# Patient Record
Sex: Male | Born: 1950
Health system: Southern US, Community
[De-identification: ages and names within clinical notes are randomized; demographics above are authoritative.]

## PROBLEM LIST (undated history)

## (undated) DIAGNOSIS — E785 Hyperlipidemia, unspecified: Secondary | ICD-10-CM

## (undated) DIAGNOSIS — F32A Depression, unspecified: Secondary | ICD-10-CM

## (undated) DIAGNOSIS — R011 Cardiac murmur, unspecified: Secondary | ICD-10-CM

## (undated) DIAGNOSIS — F329 Major depressive disorder, single episode, unspecified: Secondary | ICD-10-CM

## (undated) DIAGNOSIS — R0602 Shortness of breath: Secondary | ICD-10-CM

## (undated) DIAGNOSIS — R42 Dizziness and giddiness: Secondary | ICD-10-CM

## (undated) DIAGNOSIS — R51 Headache: Secondary | ICD-10-CM

## (undated) DIAGNOSIS — J449 Chronic obstructive pulmonary disease, unspecified: Secondary | ICD-10-CM

## (undated) DIAGNOSIS — G43701 Chronic migraine without aura, not intractable, with status migrainosus: Secondary | ICD-10-CM

## (undated) DIAGNOSIS — G4730 Sleep apnea, unspecified: Secondary | ICD-10-CM

## (undated) DIAGNOSIS — K219 Gastro-esophageal reflux disease without esophagitis: Secondary | ICD-10-CM

## (undated) DIAGNOSIS — I10 Essential (primary) hypertension: Secondary | ICD-10-CM

## (undated) HISTORY — DX: Chronic migraine without aura, not intractable, with status migrainosus: G43.701

## (undated) HISTORY — PX: OTHER SURGICAL HISTORY: SHX169

---

## 1998-06-23 ENCOUNTER — Ambulatory Visit (HOSPITAL_COMMUNITY): Admission: RE | Admit: 1998-06-23 | Discharge: 1998-06-23 | Payer: Self-pay | Admitting: Family Medicine

## 1999-10-11 ENCOUNTER — Encounter (INDEPENDENT_AMBULATORY_CARE_PROVIDER_SITE_OTHER): Payer: Self-pay

## 1999-10-11 ENCOUNTER — Other Ambulatory Visit: Admission: RE | Admit: 1999-10-11 | Discharge: 1999-10-11 | Payer: Self-pay | Admitting: Gastroenterology

## 1999-12-14 ENCOUNTER — Encounter: Admission: RE | Admit: 1999-12-14 | Discharge: 1999-12-14 | Payer: Self-pay | Admitting: Family Medicine

## 1999-12-14 ENCOUNTER — Encounter: Payer: Self-pay | Admitting: Family Medicine

## 2001-11-08 ENCOUNTER — Encounter: Payer: Self-pay | Admitting: Urology

## 2001-11-13 ENCOUNTER — Observation Stay (HOSPITAL_COMMUNITY): Admission: RE | Admit: 2001-11-13 | Discharge: 2001-11-14 | Payer: Self-pay | Admitting: Urology

## 2002-01-23 ENCOUNTER — Ambulatory Visit (HOSPITAL_COMMUNITY): Admission: RE | Admit: 2002-01-23 | Discharge: 2002-01-23 | Payer: Self-pay | Admitting: Family Medicine

## 2002-01-23 ENCOUNTER — Ambulatory Visit: Admission: RE | Admit: 2002-01-23 | Discharge: 2002-01-23 | Payer: Self-pay | Admitting: Family Medicine

## 2002-01-23 ENCOUNTER — Encounter: Payer: Self-pay | Admitting: Family Medicine

## 2004-07-18 ENCOUNTER — Ambulatory Visit: Payer: Self-pay | Admitting: Family Medicine

## 2004-08-19 ENCOUNTER — Ambulatory Visit (HOSPITAL_COMMUNITY): Admission: RE | Admit: 2004-08-19 | Discharge: 2004-08-19 | Payer: Self-pay | Admitting: Family Medicine

## 2004-08-19 ENCOUNTER — Ambulatory Visit: Payer: Self-pay | Admitting: Sports Medicine

## 2004-08-25 ENCOUNTER — Ambulatory Visit: Payer: Self-pay | Admitting: Family Medicine

## 2004-09-16 ENCOUNTER — Ambulatory Visit (HOSPITAL_COMMUNITY): Admission: RE | Admit: 2004-09-16 | Discharge: 2004-09-16 | Payer: Self-pay | Admitting: Vascular Surgery

## 2004-09-16 ENCOUNTER — Ambulatory Visit: Payer: Self-pay | Admitting: Sports Medicine

## 2004-10-10 ENCOUNTER — Ambulatory Visit: Payer: Self-pay | Admitting: Family Medicine

## 2004-12-12 ENCOUNTER — Ambulatory Visit: Payer: Self-pay | Admitting: Sports Medicine

## 2005-09-28 ENCOUNTER — Ambulatory Visit (HOSPITAL_COMMUNITY): Admission: RE | Admit: 2005-09-28 | Discharge: 2005-09-28 | Payer: Self-pay | Admitting: Family Medicine

## 2005-09-28 ENCOUNTER — Ambulatory Visit: Payer: Self-pay | Admitting: Sports Medicine

## 2006-04-26 ENCOUNTER — Ambulatory Visit: Payer: Self-pay | Admitting: Family Medicine

## 2006-05-03 ENCOUNTER — Ambulatory Visit: Payer: Self-pay | Admitting: Family Medicine

## 2006-05-04 ENCOUNTER — Encounter: Admission: RE | Admit: 2006-05-04 | Discharge: 2006-05-04 | Payer: Self-pay | Admitting: Sports Medicine

## 2006-05-10 ENCOUNTER — Ambulatory Visit: Payer: Self-pay | Admitting: Family Medicine

## 2006-05-23 ENCOUNTER — Ambulatory Visit: Payer: Self-pay | Admitting: Family Medicine

## 2006-05-29 ENCOUNTER — Ambulatory Visit: Payer: Self-pay | Admitting: Family Medicine

## 2006-06-25 ENCOUNTER — Ambulatory Visit (HOSPITAL_COMMUNITY): Admission: RE | Admit: 2006-06-25 | Discharge: 2006-06-25 | Payer: Self-pay | Admitting: *Deleted

## 2006-07-23 ENCOUNTER — Ambulatory Visit: Payer: Self-pay | Admitting: Family Medicine

## 2009-02-11 ENCOUNTER — Encounter: Admission: RE | Admit: 2009-02-11 | Discharge: 2009-02-11 | Payer: Self-pay | Admitting: Neurological Surgery

## 2010-07-20 ENCOUNTER — Encounter: Admission: RE | Admit: 2010-07-20 | Discharge: 2010-07-20 | Payer: Self-pay | Admitting: Neurology

## 2010-09-05 ENCOUNTER — Encounter: Admission: RE | Admit: 2010-09-05 | Discharge: 2010-09-05 | Payer: Self-pay | Admitting: Internal Medicine

## 2010-11-20 ENCOUNTER — Encounter: Payer: Self-pay | Admitting: *Deleted

## 2011-03-17 NOTE — Op Note (Signed)
[facility]  Patient:    Mitchell Baxter, Mitchell Baxter Visit Number: [id number] MRN: [id number]          Service Type: SUR Location: 3W [id number] [id number] Attending Physician:  Lauree Chandler Dictated by:   Maretta Bees. Vonita Moss, M.D. Proc. Date: 11/13/01 Admit Date:  11/13/2001                             Operative Report  PREOPERATIVE DIAGNOSIS:  Impotency.  POSTOPERATIVE DIAGNOSIS:  Impotency.  PROCEDURE:  Insertion of Mentor inflatable prosthesis.  SURGEON:  Maretta Bees. Vonita Moss, M.D.  ASSISTANT:  Veverly Fells. Vernie Ammons, M.D.  ANESTHESIA:  General.  INDICATIONS:  This 60 year old gentleman has a many-year history of impotency going back to the mid 1990s. He has been treated and evaluated at [facility]. He has undergone nocturnal penile tumescence testing and has had penile injection therapy, none of which satisfactorily corrected his erectile dysfunction. For that reason, he was counseled about penile implant and he is admitted for that reason.  DESCRIPTION OF PROCEDURE:  The patient was placed in the supine position and the lower abdomen and external genitalia were given a 10-minute scrub and Foley catheter was inserted per urethra. A penoscrotal incision was made and both corpora dissected out, and anchoring stitches were placed in each corpora proximal to the penoscrotal junction. Corporotomies were performed. Hegar dilators were used to dilate the corpora to Hegar 14 dilators without difficulty. Corporal measurements were 20 cm. At this point, a 70 cc reservoir was placed just behind the right side of the symphysis pubis by perforating the fascia at the level of the external ring and keeping the spermatic cord lateral and distal. The reservoir was then filled without difficulty with 70 cc of water. The Furlow needle passes were used to put each inflatable prosthesis in corresponding corpora. They were then inflated and were in very good position and created a  good straight erection. Scrotal dissection was then utilized to place the pump dependent on the right side of the scrotum. The tubing from the reservoir was then connected to the tubing of the pump with the Titan cylinder valve system. Penile prosthesis then inflated again and had a very good result and then was deflated. The tubings were placed in the back part of the scrotum by suturing soft tissue in front of them. The corpora and the wound were irrigated with antibiotic solution. The scrotal incision was then closed in two layers of running 3-0 chromic catgut. The Coban pressure dressing was placed on the penis and fluffs were placed on the scrotum along with the scrotal support and he was taken to the recovery room in good condition having tolerated the procedure well with no significant blood loss. Dictated by:   Maretta Bees. Vonita Moss, M.D. Attending Physician:  Lauree Chandler DD:  11/13/01 TD:  11/13/01 Job: [id number]

## 2011-11-29 ENCOUNTER — Encounter (HOSPITAL_COMMUNITY)
Admission: RE | Disposition: A | Discharge status: Home / Self Care | Payer: Self-pay | Source: Ambulatory Visit | Attending: Gastroenterology

## 2011-11-29 ENCOUNTER — Ambulatory Visit (HOSPITAL_COMMUNITY)
Admission: RE | Admit: 2011-11-29 | Discharge: 2011-11-29 | Disposition: A | Discharge status: Home / Self Care | Payer: BC Managed Care – PPO | Source: Ambulatory Visit | Attending: Gastroenterology | Admitting: Gastroenterology

## 2011-11-29 ENCOUNTER — Encounter (HOSPITAL_COMMUNITY): Payer: Self-pay | Admitting: Anesthesiology

## 2011-11-29 ENCOUNTER — Ambulatory Visit (HOSPITAL_COMMUNITY): Payer: BC Managed Care – PPO | Admitting: Anesthesiology

## 2011-11-29 ENCOUNTER — Encounter (HOSPITAL_COMMUNITY): Payer: Self-pay | Admitting: *Deleted

## 2011-11-29 DIAGNOSIS — K2980 Duodenitis without bleeding: Secondary | ICD-10-CM | POA: Insufficient documentation

## 2011-11-29 DIAGNOSIS — Z79899 Other long term (current) drug therapy: Secondary | ICD-10-CM | POA: Insufficient documentation

## 2011-11-29 DIAGNOSIS — K219 Gastro-esophageal reflux disease without esophagitis: Secondary | ICD-10-CM | POA: Insufficient documentation

## 2011-11-29 DIAGNOSIS — Z7982 Long term (current) use of aspirin: Secondary | ICD-10-CM | POA: Insufficient documentation

## 2011-11-29 DIAGNOSIS — K2970 Gastritis, unspecified, without bleeding: Secondary | ICD-10-CM | POA: Insufficient documentation

## 2011-11-29 DIAGNOSIS — I10 Essential (primary) hypertension: Secondary | ICD-10-CM | POA: Insufficient documentation

## 2011-11-29 DIAGNOSIS — K449 Diaphragmatic hernia without obstruction or gangrene: Secondary | ICD-10-CM | POA: Insufficient documentation

## 2011-11-29 DIAGNOSIS — K2990 Gastroduodenitis, unspecified, without bleeding: Secondary | ICD-10-CM | POA: Insufficient documentation

## 2011-11-29 HISTORY — PX: BRAVO PH STUDY: SHX5421

## 2011-11-29 HISTORY — DX: Hyperlipidemia, unspecified: E78.5

## 2011-11-29 HISTORY — DX: Headache: R51

## 2011-11-29 HISTORY — DX: Dizziness and giddiness: R42

## 2011-11-29 HISTORY — DX: Major depressive disorder, single episode, unspecified: F32.9

## 2011-11-29 HISTORY — DX: Essential (primary) hypertension: I10

## 2011-11-29 HISTORY — DX: Gastro-esophageal reflux disease without esophagitis: K21.9

## 2011-11-29 HISTORY — DX: Depression, unspecified: F32.A

## 2011-11-29 HISTORY — DX: Chronic obstructive pulmonary disease, unspecified: J44.9

## 2011-11-29 SURGERY — ESOPHAGOGASTRODUODENOSCOPY (EGD) WITH PROPOFOL
Anesthesia: Monitor Anesthesia Care

## 2011-11-29 MED ORDER — KETAMINE HCL 10 MG/ML IJ SOLN
INTRAMUSCULAR | Status: DC | PRN
  Administered 2011-11-29: 10 mg via INTRAVENOUS

## 2011-11-29 MED ORDER — PROPOFOL 10 MG/ML IV EMUL
INTRAVENOUS | Status: DC | PRN
  Administered 2011-11-29: 100 ug/kg/min via INTRAVENOUS

## 2011-11-29 MED ORDER — SODIUM CHLORIDE 0.9 % IV SOLN
INTRAVENOUS | Status: DC | PRN
  Administered 2011-11-29: 10:00:00 via INTRAVENOUS

## 2011-11-29 MED ORDER — BUTAMBEN-TETRACAINE-BENZOCAINE 2-2-14 % EX AERO
INHALATION_SPRAY | CUTANEOUS | Status: DC | PRN
  Administered 2011-11-29: 2 via TOPICAL

## 2011-11-29 MED ORDER — SODIUM CHLORIDE 0.9 % IV SOLN
Freq: Once | INTRAVENOUS | Status: AC
  Administered 2011-11-29: 1000 mL via INTRAVENOUS

## 2011-11-29 MED ORDER — MIDAZOLAM HCL 5 MG/5ML IJ SOLN
INTRAMUSCULAR | Status: DC | PRN
  Administered 2011-11-29: 2 mg via INTRAVENOUS

## 2011-11-29 MED ORDER — FENTANYL CITRATE 0.05 MG/ML IJ SOLN
INTRAMUSCULAR | Status: DC | PRN
  Administered 2011-11-29: 50 ug via INTRAVENOUS

## 2011-11-29 NOTE — Anesthesia Postprocedure Evaluation (Signed)
  Anesthesia Post-op Note  Patient: Mitchell Baxter  Procedure(s) Performed:  ESOPHAGOGASTRODUODENOSCOPY (EGD) WITH PROPOFOL; BRAVO PH STUDY  Patient Location: PACU  Anesthesia Type: MAC  Level of Consciousness: awake and alert   Airway and Oxygen Therapy: Patient Spontanous Breathing  Post-op Pain: mild  Post-op Assessment: Post-op Vital signs reviewed, Patient's Cardiovascular Status Stable, Respiratory Function Stable, Patent Airway and No signs of Nausea or vomiting  Post-op Vital Signs: stable  Complications: No apparent anesthesia complications

## 2011-11-29 NOTE — Op Note (Signed)
[facility] [address] [place], [place]  [address]  ENDOSCOPY PROCEDURE REPORT  PATIENT:  Mitchell Baxter, Mitchell Baxter  MR#:  [id number] BIRTHDATE:  04-14-1951, 60 yrs. old  GENDER:  male  ENDOSCOPIST:  Willis Modena, MD Referred by:  Tomi Bamberger, NP  PROCEDURE DATE:  11/29/2011 PROCEDURE:  Bravo pH probe placement (ON antisecretory therapy) ASA CLASS:  Class III INDICATIONS:  GERD (refractory to multiple medication trials)  MEDICATIONS:   MAC sedation, administered by CRNA, Cetacaine spray x 2  DESCRIPTION OF PROCEDURE:   After the risks benefits and alternatives of the procedure were thoroughly explained, informed consent was obtained.  The  endoscope was introduced through the mouth and advanced to the second portion of the duodenum, without limitations.  The instrument was slowly withdrawn as the mucosa was fully examined.  <<PROCEDUREIMAGES>>  FINDINGS:  Small hiatal hernia; GE junction 43 cm from the incisors.  Otherwise normal esophagus.  Diffuse mild-to-moderate body and antral gastritis, previously seen and biopsied. Mild-to-moderate bulbar duodenitis, previously seen.  Otherwise normal endoscopy.  After completion of endoscopy, endoscope was removed and Bravo pH catheter was placed at 37 cm from the incisors (6cm proximal to GE junction) employing standard protocol.  Second look endoscopy confirmed appropriate positioning of the Bravo device. ENDOSCOPIC IMPRESSION:    1.  Hiatal hernia, gastroduodenitis, all seen on prior endoscopy. 2.  Successful placement of Bravo device, as described above.  RECOMMENDATIONS:      1.  Watch for potential complications of procedure. 2.  Continue Nexium bid for now. 3.  Await pH probe studies.  If persistent GERD is confirmed, consider evalation for Nissen fundoplication.  If persistent GERD is not seen, consider tertiary evaluation (via impedance) for             non-acid reflux. 4.  OV 3-4 weeks.  REPEAT EXAM:   No  ______________________________ Willis Modena  CC:  n. eSIGNEDWillis Modena at 11/29/2011 11:17 AM  Dorothy Puffer, [phone]

## 2011-11-29 NOTE — H&P (Signed)
Patient interval history reviewed.  Patient examined again.  There has been no change from documented H/P dated 11/28/11 (scanned into chart from our office) except as documented above.

## 2011-11-29 NOTE — Transfer of Care (Signed)
Immediate Anesthesia Transfer of Care Note  Patient: Mitchell Baxter  Procedure(s) Performed:  ESOPHAGOGASTRODUODENOSCOPY (EGD) WITH PROPOFOL; BRAVO PH STUDY  Patient Location: PACU and Endoscopy Unit  Anesthesia Type: MAC  Level of Consciousness: awake and alert   Airway & Oxygen Therapy: Patient Spontanous Breathing and Patient connected to nasal cannula oxygen  Post-op Assessment: Report given to PACU RN and Post -op Vital signs reviewed and stable  Post vital signs: Reviewed and stable  Complications: No apparent anesthesia complications

## 2011-11-29 NOTE — Brief Op Note (Signed)
Please see EndoPro note dated 11/29/11.  

## 2011-11-29 NOTE — Anesthesia Preprocedure Evaluation (Addendum)
Anesthesia Evaluation  Patient identified by MRN, date of birth, ID band Patient awake    Reviewed: Allergy & Precautions, H&P , NPO status , Patient's Chart, lab work & pertinent test results  Airway Mallampati: II TM Distance: >3 FB Neck ROM: full    Dental No notable dental hx. (+) Teeth Intact and Dental Advisory Given   Pulmonary COPD COPD inhaler, Current Smoker,  Moderate COPD clear to auscultation  Pulmonary exam normal       Cardiovascular Exercise Tolerance: Good hypertension, On Medications regular Normal    Neuro/Psych  Headaches, vertigo Negative Neurological ROS  Negative Psych ROS   GI/Hepatic negative GI ROS, Neg liver ROS, GERD-  Medicated and Poorly Controlled,  Endo/Other  Negative Endocrine ROS  Renal/GU negative Renal ROS  Genitourinary negative   Musculoskeletal   Abdominal   Peds  Hematology negative hematology ROS (+)   Anesthesia Other Findings   Reproductive/Obstetrics negative OB ROS                          Anesthesia Physical Anesthesia Plan  ASA: III  Anesthesia Plan: MAC   Post-op Pain Management:    Induction:   Airway Management Planned: Simple Face Mask  Additional Equipment:   Intra-op Plan:   Post-operative Plan:   Informed Consent: I have reviewed the patients History and Physical, chart, labs and discussed the procedure including the risks, benefits and alternatives for the proposed anesthesia with the patient or authorized representative who has indicated his/her understanding and acceptance.   Dental Advisory Given  Plan Discussed with: CRNA and Surgeon  Anesthesia Plan Comments:         Anesthesia Quick Evaluation

## 2011-11-30 ENCOUNTER — Encounter (HOSPITAL_COMMUNITY): Payer: Self-pay | Admitting: Gastroenterology

## 2011-12-21 ENCOUNTER — Encounter (INDEPENDENT_AMBULATORY_CARE_PROVIDER_SITE_OTHER): Payer: Self-pay | Admitting: General Surgery

## 2011-12-22 ENCOUNTER — Other Ambulatory Visit (INDEPENDENT_AMBULATORY_CARE_PROVIDER_SITE_OTHER): Payer: Self-pay | Admitting: Surgery

## 2011-12-22 ENCOUNTER — Encounter (INDEPENDENT_AMBULATORY_CARE_PROVIDER_SITE_OTHER): Payer: Self-pay | Admitting: Surgery

## 2011-12-22 ENCOUNTER — Encounter (INDEPENDENT_AMBULATORY_CARE_PROVIDER_SITE_OTHER): Payer: Self-pay | Admitting: General Surgery

## 2011-12-22 ENCOUNTER — Ambulatory Visit
Admission: RE | Admit: 2011-12-22 | Discharge: 2011-12-22 | Disposition: A | Discharge status: Home / Self Care | Payer: BC Managed Care – PPO | Source: Ambulatory Visit | Attending: Surgery | Admitting: Surgery

## 2011-12-22 ENCOUNTER — Ambulatory Visit (INDEPENDENT_AMBULATORY_CARE_PROVIDER_SITE_OTHER): Payer: BC Managed Care – PPO | Admitting: Surgery

## 2011-12-22 VITALS — BP 176/118 | HR 80 | Temp 98.4°F | Resp 20 | Ht 73.0 in | Wt 233.4 lb

## 2011-12-22 DIAGNOSIS — K219 Gastro-esophageal reflux disease without esophagitis: Secondary | ICD-10-CM

## 2011-12-22 DIAGNOSIS — R51 Headache: Secondary | ICD-10-CM

## 2011-12-22 DIAGNOSIS — E7800 Pure hypercholesterolemia, unspecified: Secondary | ICD-10-CM | POA: Insufficient documentation

## 2011-12-22 DIAGNOSIS — F329 Major depressive disorder, single episode, unspecified: Secondary | ICD-10-CM | POA: Insufficient documentation

## 2011-12-22 DIAGNOSIS — I10 Essential (primary) hypertension: Secondary | ICD-10-CM | POA: Insufficient documentation

## 2011-12-22 DIAGNOSIS — F32A Depression, unspecified: Secondary | ICD-10-CM | POA: Insufficient documentation

## 2011-12-22 NOTE — Patient Instructions (Signed)
I think your main problem is motility. Take multiple glasses of MiraLax until you have cleared your colon of stool Obtain the UGI series We will later consider starting you on Reglan

## 2011-12-22 NOTE — Progress Notes (Signed)
Chief Complaint:  GERD symptoms  History of Present Illness:  Mitchell Baxter is an 61 y.o. male who comes with his wife Mitchell Baxter today to discuss his problem with heartburn and hiccoughs.  He had an Endo and biopsy which was negative for H. Pylori. He has taken Prilosec, Zantac, Carafate, and excellent without improvement. He is currently taking Nexium twice a day. He has a history of severe migraines and takes indomethacin for that. On endoscopy day he did have some distal gastritis and perhaps that is related to the indomethacin. He had tried Carafate and that did not help.    Past Medical History  Diagnosis Date  . Hypertension   . Hyperlipidemia   . Headache   . Depression   . GERD (gastroesophageal reflux disease)   . Vertigo   . COPD (chronic obstructive pulmonary disease)     Past Surgical History  Procedure Date  . None   . Bravo ph study 11/29/2011    Procedure: BRAVO PH STUDY;  Surgeon: Freddy Jaksch, MD;  Location: [facility] ENDOSCOPY;  Service: Endoscopy;  Laterality: N/A;    Current Outpatient Prescriptions  Medication Sig Dispense Refill  . aspirin 81 MG tablet Take 160 mg by mouth daily.      Marland Kitchen buPROPion (WELLBUTRIN XL) 150 MG 24 hr tablet Take 150 mg by mouth daily.      Marland Kitchen dexlansoprazole (DEXILANT) 60 MG capsule Take 60 mg by mouth daily.      . diphenhydrAMINE (BENADRYL) 25 MG tablet Take 25 mg by mouth every 6 (six) hours as needed.      Marland Kitchen esomeprazole (NEXIUM) 40 MG capsule Take 40 mg by mouth. 2 times daily      . FLUoxetine (PROZAC) 40 MG capsule Take 40 mg by mouth daily.      . indomethacin (INDOCIN) 50 MG capsule Take 50 mg by mouth 1 day or 1 dose.      . metoprolol succinate (TOPROL-XL) 25 MG 24 hr tablet Take 25 mg by mouth daily.       Review of patient's allergies indicates no known allergies. Family History  Problem Relation Age of Onset  . Anesthesia problems Neg Hx   . Hypotension Neg Hx   . Malignant hyperthermia Neg Hx   . Pseudochol deficiency  Neg Hx   . Cancer Father     colon, pancreatic   Social History:   reports that he has been smoking Cigarettes.  He has been smoking about 1 pack per day. He does not have any smokeless tobacco history on file. He reports that he drinks alcohol. He reports that he does not use illicit drugs.   REVIEW OF SYSTEMS - PERTINENT POSITIVES ONLY: Positive for elevated cholesterol, migraines, constipation of recent onset, abdominal pain.  Physical Exam:   Blood pressure 176/118, pulse 80, temperature 98.4 F (36.9 C), temperature source Temporal, resp. rate 20, height 6\' 1"  (1.854 m), weight 233 lb 6.4 oz (105.87 kg). Body mass index is 30.79 kg/(m^2).  Gen:  WDWN white male NAD  Neurological: Alert and oriented to person, place, and time. Motor and sensory function is grossly intact  Head: Normocephalic and atraumatic.  Eyes: Conjunctivae are normal. Pupils are equal, round, and reactive to light. No scleral icterus.  Neck: Normal range of motion. Neck supple. No tracheal deviation or thyromegaly present.  Abdomen:  nontender  LABORATORY RESULTS: No results found for this or any previous visit (from the past 48 hour(s)).  RADIOLOGY RESULTS: No  results found.  Problem List: Patient Active Problem List  Diagnoses  . Chronic headaches  . HTN (hypertension)  . High cholesterol  . Depression    Assessment & Plan: Delayed gastric emptying and increasing constipation seemed to occurred at the same time as his hiccuping and reflux symptoms. This sounds like a motility issue. I will get an upper GI series to look at his mechanics of swallowing and his EG junction. I will try to get him to clean his colon and we may consider putting him on Reglan in the short term. Plan upper GI series and follow up in the office after that.    Mitchell B. Daphine Deutscher, MD, [facility] [phone] beeper [phone]  12/22/2011 9:28 AM

## 2012-01-01 ENCOUNTER — Encounter (INDEPENDENT_AMBULATORY_CARE_PROVIDER_SITE_OTHER): Payer: Self-pay

## 2012-01-04 ENCOUNTER — Ambulatory Visit (INDEPENDENT_AMBULATORY_CARE_PROVIDER_SITE_OTHER): Payer: BC Managed Care – PPO | Admitting: Surgery

## 2012-01-05 ENCOUNTER — Telehealth (INDEPENDENT_AMBULATORY_CARE_PROVIDER_SITE_OTHER): Payer: Self-pay | Admitting: Surgery

## 2012-01-05 NOTE — Telephone Encounter (Signed)
Is it possible to put him on Reglan as Dr. Kerry Fort had suggested, he's cleaned himself as much as he could and he's not feeling as well as he would like to or would Dr. Kerry Fort rather him have the colonoscopy, please let me know

## 2012-01-09 NOTE — Telephone Encounter (Signed)
I received this from Marnette Burgess what would you like to do?

## 2012-01-12 ENCOUNTER — Other Ambulatory Visit (INDEPENDENT_AMBULATORY_CARE_PROVIDER_SITE_OTHER): Payer: Self-pay | Admitting: Surgery

## 2012-01-12 MED ORDER — METOCLOPRAMIDE HCL 5 MG PO TABS: 5.0000 mg | ORAL_TABLET | Freq: Four times a day (QID) | ORAL | Status: DC

## 2012-03-28 ENCOUNTER — Ambulatory Visit (INDEPENDENT_AMBULATORY_CARE_PROVIDER_SITE_OTHER): Payer: BC Managed Care – PPO | Admitting: Surgery

## 2012-03-28 ENCOUNTER — Other Ambulatory Visit (INDEPENDENT_AMBULATORY_CARE_PROVIDER_SITE_OTHER): Payer: Self-pay | Admitting: Surgery

## 2012-03-28 ENCOUNTER — Encounter (INDEPENDENT_AMBULATORY_CARE_PROVIDER_SITE_OTHER): Payer: Self-pay | Admitting: Surgery

## 2012-03-28 VITALS — BP 137/86 | HR 75 | Temp 98.0°F | Ht 72.0 in | Wt 228.6 lb

## 2012-03-28 DIAGNOSIS — K219 Gastro-esophageal reflux disease without esophagitis: Secondary | ICD-10-CM

## 2012-03-28 NOTE — Progress Notes (Signed)
Chief Complaint:  Choking and GERD; UGI with small hiatus hernia and reflux  History of Present Illness:  Mitchell Baxter is an 61 y.o. male who has been having coughing and choking particularly at night along with reflux. PPIs decrease his acidity but don't help with his coughing.  A recent upper GI series showed a small hiatal hernia with free reflux. I reviewed that study. I discussed laparoscopic Nissen fundoplication with him and his wife [name] and they want to go ahead and proceed with repair. I explained the procedure in some detail and gave him a booklet on this procedure. We'll move towards scheduling for a laparoscopic hiatal hernia repair and Nissen fundoplication.  Past Medical History  Diagnosis Date  . Hypertension   . Hyperlipidemia   . Headache   . Depression   . GERD (gastroesophageal reflux disease)   . Vertigo   . COPD (chronic obstructive pulmonary disease)     Past Surgical History  Procedure Date  . None   . Bravo ph study 11/29/2011    Procedure: BRAVO PH STUDY;  Surgeon: Freddy Jaksch, MD;  Location: [facility];  Service: Endoscopy;  Laterality: N/A;    Current Outpatient Prescriptions  Medication Sig Dispense Refill  . aspirin 81 MG tablet Take 160 mg by mouth daily.      Marland Kitchen buPROPion (WELLBUTRIN XL) 150 MG 24 hr tablet Take 150 mg by mouth daily.      . diphenhydrAMINE (BENADRYL) 25 MG tablet Take 25 mg by mouth every 6 (six) hours as needed.      Marland Kitchen esomeprazole (NEXIUM) 40 MG capsule Take 40 mg by mouth. 2 times daily      . metoprolol succinate (TOPROL-XL) 25 MG 24 hr tablet Take 25 mg by mouth daily.      . metoCLOPramide (REGLAN) 5 MG tablet Take 1 tablet (5 mg total) by mouth 4 (four) times daily.  80 tablet  1  . DISCONTD: FLUoxetine (PROZAC) 40 MG capsule Take 40 mg by mouth daily.      Marland Kitchen DISCONTD: metoCLOPramide (REGLAN) 5 MG tablet        Review of patient's allergies indicates no known allergies. Family History  Problem Relation Age of  Onset  . Anesthesia problems Neg Hx   . Hypotension Neg Hx   . Malignant hyperthermia Neg Hx   . Pseudochol deficiency Neg Hx   . Cancer Father     colon, pancreatic   Social History:   reports that he has been smoking Cigarettes.  He has been smoking about 1 pack per day. He does not have any smokeless tobacco history on file. He reports that he drinks alcohol. He reports that he does not use illicit drugs.   REVIEW OF SYSTEMS - PERTINENT POSITIVES ONLY: Neg for hx of DVT; no prior abdominal surgery  Physical Exam:   Blood pressure 137/86, pulse 75, temperature 98 F (36.7 C), temperature source Temporal, height 6' (1.829 m), weight 228 lb 9.6 oz (103.692 kg), SpO2 94.00%. Body mass index is 31.00 kg/(m^2).  Gen:  WDWN WM NAD  Neurological: Alert and oriented to person, place, and time. Motor and sensory function is grossly intact  Head: Normocephalic and atraumatic.  Eyes: Conjunctivae are normal. Pupils are equal, round, and reactive to light. No scleral icterus.  Neck: Normal range of motion. Neck supple. No tracheal deviation or thyromegaly present.  Cardiovascular:  SR without murmurs or gallops.  No carotid bruits Respiratory: Effort normal.  No respiratory  distress. No chest wall tenderness. Breath sounds normal.  No wheezes, rales or rhonchi.  Abdomen:  nontender GU: Musculoskeletal: Normal range of motion. Extremities are nontender. No cyanosis, edema or clubbing noted Lymphadenopathy: No cervical, preauricular, postauricular or axillary adenopathy is present Skin: Skin is warm and dry. No rash noted. No diaphoresis. No erythema. No pallor. Pscyh: Normal mood and affect. Behavior is normal. Judgment and thought content normal.   LABORATORY RESULTS: No results found for this or any previous visit (from the past 48 hour(s)).  RADIOLOGY RESULTS: No results found.  Problem List: Patient Active Problem List  Diagnoses  . Chronic headaches  . HTN (hypertension)  . High  cholesterol  . Depression    Assessment & Plan: Symptomatic GER and hiatus hernia. Laparoscopic Nissen fundoplication    [name] Daphine Deutscher, MD, [facility] [phone] beeper [phone]  03/28/2012 9:30 AM

## 2012-03-28 NOTE — Patient Instructions (Signed)
Prepare for surgery.

## 2012-04-02 ENCOUNTER — Encounter (HOSPITAL_COMMUNITY): Payer: Self-pay | Admitting: Pharmacy Technician

## 2012-04-09 ENCOUNTER — Encounter (HOSPITAL_COMMUNITY): Payer: Self-pay

## 2012-04-09 ENCOUNTER — Ambulatory Visit (HOSPITAL_COMMUNITY)
Admission: RE | Admit: 2012-04-09 | Discharge: 2012-04-09 | Disposition: A | Discharge status: Home / Self Care | Payer: BC Managed Care – PPO | Source: Ambulatory Visit | Attending: Surgery | Admitting: Surgery

## 2012-04-09 ENCOUNTER — Encounter (HOSPITAL_COMMUNITY)
Admission: RE | Admit: 2012-04-09 | Discharge: 2012-04-09 | Disposition: A | Discharge status: Home / Self Care | Payer: BC Managed Care – PPO | Source: Ambulatory Visit | Attending: Surgery | Admitting: Surgery

## 2012-04-09 DIAGNOSIS — Z01812 Encounter for preprocedural laboratory examination: Secondary | ICD-10-CM | POA: Insufficient documentation

## 2012-04-09 DIAGNOSIS — Z01810 Encounter for preprocedural cardiovascular examination: Secondary | ICD-10-CM | POA: Insufficient documentation

## 2012-04-09 DIAGNOSIS — K449 Diaphragmatic hernia without obstruction or gangrene: Secondary | ICD-10-CM | POA: Insufficient documentation

## 2012-04-09 DIAGNOSIS — K219 Gastro-esophageal reflux disease without esophagitis: Secondary | ICD-10-CM | POA: Insufficient documentation

## 2012-04-09 HISTORY — DX: Sleep apnea, unspecified: G47.30

## 2012-04-09 LAB — SURGICAL PCR SCREEN: Staphylococcus aureus: NEGATIVE

## 2012-04-09 LAB — CBC
HCT: 47.6 % (ref 39.0–52.0)
MCV: 93 fL (ref 78.0–100.0)
RBC: 5.12 MIL/uL (ref 4.22–5.81)
RDW: 13.2 % (ref 11.5–15.5)
WBC: 8.8 10*3/uL (ref 4.0–10.5)

## 2012-04-09 LAB — BASIC METABOLIC PANEL
BUN: 8 mg/dL (ref 6–23)
CO2: 25 mEq/L (ref 19–32)
Chloride: 104 mEq/L (ref 96–112)
Creatinine, Ser: 1.1 mg/dL (ref 0.50–1.35)
GFR calc Af Amer: 81 mL/min — ABNORMAL LOW (ref >90)
Potassium: 4 mEq/L (ref 3.5–5.1)

## 2012-04-09 NOTE — Patient Instructions (Signed)
20 KESSLER SOLLY  04/09/2012   Your procedure is scheduled on:  04-16-2012  Report to [facility] a 0730t  AM.  Call this number if you have problems the morning of surgery: [phone]   Remember: fleets enema night before surgery   Do not eat food or drink liquids:After Midnight.  .  Take these medicines the morning of surgery with A SIP OF WATER: nexium   Do not wear jewelry or make up.  Do not wear lotions, powders, or perfumes.Do not wear deodorant.    Do not bring valuables to the hospital.  Contacts, dentures or bridgework may not be worn into surgery.  Leave suitcase in the car. After surgery it may be brought to your room.  For patients admitted to the hospital, checkout time is 11:00 AM the day of  discharge.     Special Instructions: CHG Shower Use Special Wash: 1/2 bottle night before surgery and 1/2 bottle morning of surgery, use regular soap on face and front and back private area.   Please read over the following fact sheets that you were given: MRSA Information  Cain Sieve [facility] pre op nurse phone number [phone], call if needed

## 2012-04-09 NOTE — Progress Notes (Signed)
04/09/12 1444  OBSTRUCTIVE SLEEP APNEA  Have you ever been diagnosed with sleep apnea through a sleep study? No  Do you snore loudly (loud enough to be heard through closed doors)?  0  Do you often feel tired, fatigued, or sleepy during the daytime? 1  Has anyone observed you stop breathing during your sleep? 0  Do you have, or are you being treated for high blood pressure? 1  BMI more than 35 kg/m2? 0  Age over 62 years old? 1  Neck circumference greater than 40 cm/18 inches? 0  Gender: 1  Obstructive Sleep Apnea Score 4   Score 4 or greater  Updated health history;No PCP

## 2012-04-15 NOTE — H&P (Addendum)
Chief Complaint: Choking and GERD; UGI with small hiatus hernia and reflux  History of Present Illness: Mitchell Baxter is an 61 y.o. male who has been having coughing and choking particularly at night along with reflux. PPIs decrease his acidity but don't help with his coughing.  A recent upper GI series showed a small hiatal hernia with free reflux. I reviewed that study. I discussed laparoscopic Nissen fundoplication with him and his wife Mitchell Baxter and they want to go ahead and proceed with repair. I explained the procedure in some detail and gave him a booklet on this procedure. We'll move towards scheduling for a laparoscopic hiatal hernia repair and Nissen fundoplication.  Past Medical History   Diagnosis  Date   .  Hypertension    .  Hyperlipidemia    .  Headache    .  Depression    .  GERD (gastroesophageal reflux disease)    .  Vertigo    .  COPD (chronic obstructive pulmonary disease)     Past Surgical History   Procedure  Date   .  None    .  Bravo ph study  11/29/2011     Procedure: BRAVO PH STUDY; Surgeon: Mitchell Jaksch, MD; Location: [facility] END[facility]; Service: Endoscopy; Laterality: N/A;    Current Outpatient Prescriptions   Medication  Sig  Dispense  Refill   .  aspirin 81 MG tablet  Take 160 mg by mouth daily.     Marland Kitchen  buPROPion (WELLBUTRIN XL) 150 MG 24 hr tablet  Take 150 mg by mouth daily.     .  diphenhydrAMINE (BENADRYL) 25 MG tablet  Take 25 mg by mouth every 6 (six) hours as needed.     Marland Kitchen  esomeprazole (NEXIUM) 40 MG capsule  Take 40 mg by mouth. 2 times daily     .  metoprolol succinate (TOPROL-XL) 25 MG 24 hr tablet  Take 25 mg by mouth daily.     .  metoCLOPramide (REGLAN) 5 MG tablet  Take 1 tablet (5 mg total) by mouth 4 (four) times daily.  80 tablet  1   .  DISCONTD: FLUoxetine (PROZAC) 40 MG capsule  Take 40 mg by mouth daily.     Marland Kitchen  DISCONTD: metoCLOPramide (REGLAN) 5 MG tablet       Review of patient's allergies indicates no known allergies.  Family History    Problem  Relation  Age of Onset   .  Anesthesia problems  Neg Hx    .  Hypotension  Neg Hx    .  Malignant hyperthermia  Neg Hx    .  Pseudochol deficiency  Neg Hx    .  Cancer  Father       colon, pancreatic    Social History: reports that he has been smoking Cigarettes. He has been smoking about 1 pack per day. He does not have any smokeless tobacco history on file. He reports that he drinks alcohol. He reports that he does not use illicit drugs.  REVIEW OF SYSTEMS - PERTINENT POSITIVES ONLY:  Neg for hx of DVT; no prior abdominal surgery  Physical Exam:  Blood pressure 137/86, pulse 75, temperature 98 F (36.7 C), temperature source Temporal, height 6' (1.829 m), weight 228 lb 9.6 oz (103.692 kg), SpO2 94.00%.  Body mass index is 31.00 kg/(m^2).  Gen: WDWN WM NAD  Neurological: Alert and oriented to person, place, and time. Motor and sensory function is grossly intact  Head: Normocephalic  and atraumatic.  Eyes: Conjunctivae are normal. Pupils are equal, round, and reactive to light. No scleral icterus.  Neck: Normal range of motion. Neck supple. No tracheal deviation or thyromegaly present.  Cardiovascular: SR without murmurs or gallops. No carotid bruits  Respiratory: Effort normal. No respiratory distress. No chest wall tenderness. Breath sounds normal. No wheezes, rales or rhonchi.  Abdomen: nontender  GU:  Musculoskeletal: Normal range of motion. Extremities are nontender. No cyanosis, edema or clubbing noted Lymphadenopathy: No cervical, preauricular, postauricular or axillary adenopathy is present Skin: Skin is warm and dry. No rash noted. No diaphoresis. No erythema. No pallor. Pscyh: Normal mood and affect. Behavior is normal. Judgment and thought content normal.  LABORATORY RESULTS:  No results found for this or any previous visit (from the past 48 hour(s)).  RADIOLOGY RESULTS:  No results found.  Problem List:  Patient Active Problem List   Diagnoses   .  Chronic  headaches   .  HTN (hypertension)   .  High cholesterol   .  Depression    Assessment & Plan:  Symptomatic GER and hiatus hernia.  Laparoscopic Nissen fundoplication  Mitchell B. Daphine Deutscher, MD, [facility]  [phone] beeper  [phone]  There has been no change in the patient's past medical history or physical exam in the past 24 hours to the best of my knowledge. I examined the patient in the holding area and have made any changes to the history and physical exam report that is included above.   Expectations and outcome results have been discussed with the patient to include risks and benefits.  All questions have been answered and we will proceed with previously discussed procedure noted and signed in the consent form in the patient's record.    Mitchell Baxter BMD FACS 9:23 AM  04/16/2012

## 2012-04-16 ENCOUNTER — Observation Stay (HOSPITAL_COMMUNITY)
Admission: RE | Admit: 2012-04-16 | Discharge: 2012-04-18 | DRG: 154 | Disposition: A | Discharge status: Home / Self Care | Payer: BC Managed Care – PPO | Source: Ambulatory Visit | Attending: Surgery | Admitting: Surgery

## 2012-04-16 ENCOUNTER — Encounter (HOSPITAL_COMMUNITY): Payer: Self-pay | Admitting: *Deleted

## 2012-04-16 ENCOUNTER — Encounter (HOSPITAL_COMMUNITY)
Admission: RE | Disposition: A | Discharge status: Home / Self Care | Payer: Self-pay | Source: Ambulatory Visit | Attending: Surgery

## 2012-04-16 ENCOUNTER — Encounter (HOSPITAL_COMMUNITY): Payer: Self-pay | Admitting: Anesthesiology

## 2012-04-16 ENCOUNTER — Ambulatory Visit (HOSPITAL_COMMUNITY): Payer: BC Managed Care – PPO | Admitting: Anesthesiology

## 2012-04-16 DIAGNOSIS — F329 Major depressive disorder, single episode, unspecified: Secondary | ICD-10-CM | POA: Insufficient documentation

## 2012-04-16 DIAGNOSIS — E785 Hyperlipidemia, unspecified: Secondary | ICD-10-CM | POA: Insufficient documentation

## 2012-04-16 DIAGNOSIS — J449 Chronic obstructive pulmonary disease, unspecified: Secondary | ICD-10-CM | POA: Diagnosis not present

## 2012-04-16 DIAGNOSIS — Z7982 Long term (current) use of aspirin: Secondary | ICD-10-CM | POA: Diagnosis not present

## 2012-04-16 DIAGNOSIS — K449 Diaphragmatic hernia without obstruction or gangrene: Secondary | ICD-10-CM | POA: Diagnosis present

## 2012-04-16 DIAGNOSIS — J4489 Other specified chronic obstructive pulmonary disease: Secondary | ICD-10-CM | POA: Insufficient documentation

## 2012-04-16 DIAGNOSIS — F3289 Other specified depressive episodes: Secondary | ICD-10-CM | POA: Insufficient documentation

## 2012-04-16 DIAGNOSIS — I10 Essential (primary) hypertension: Secondary | ICD-10-CM | POA: Diagnosis not present

## 2012-04-16 DIAGNOSIS — R51 Headache: Secondary | ICD-10-CM | POA: Insufficient documentation

## 2012-04-16 DIAGNOSIS — Z79899 Other long term (current) drug therapy: Secondary | ICD-10-CM | POA: Diagnosis not present

## 2012-04-16 DIAGNOSIS — K210 Gastro-esophageal reflux disease with esophagitis: Secondary | ICD-10-CM

## 2012-04-16 DIAGNOSIS — F17200 Nicotine dependence, unspecified, uncomplicated: Secondary | ICD-10-CM | POA: Insufficient documentation

## 2012-04-16 DIAGNOSIS — K219 Gastro-esophageal reflux disease without esophagitis: Secondary | ICD-10-CM | POA: Diagnosis not present

## 2012-04-16 HISTORY — PX: HIATAL HERNIA REPAIR: SHX195

## 2012-04-16 HISTORY — PX: LAPAROSCOPIC NISSEN FUNDOPLICATION: SHX1932

## 2012-04-16 LAB — CBC
MCH: 31.7 pg (ref 26.0–34.0)
MCV: 93.8 fL (ref 78.0–100.0)
Platelets: 235 10*3/uL (ref 150–400)
RDW: 13.3 % (ref 11.5–15.5)

## 2012-04-16 SURGERY — FUNDOPLICATION, NISSEN, LAPAROSCOPIC
Anesthesia: General | Site: Abdomen | Wound class: Clean

## 2012-04-16 MED ORDER — LACTATED RINGERS IV SOLN
INTRAVENOUS | Status: DC
  Administered 2012-04-16: 14:00:00 via INTRAVENOUS
  Administered 2012-04-16: 1000 mL via INTRAVENOUS

## 2012-04-16 MED ORDER — FENTANYL CITRATE 0.05 MG/ML IJ SOLN
INTRAMUSCULAR | Status: DC | PRN
  Administered 2012-04-16 (×2): 100 ug via INTRAVENOUS
  Administered 2012-04-16: 50 ug via INTRAVENOUS

## 2012-04-16 MED ORDER — HYDROMORPHONE HCL PF 1 MG/ML IJ SOLN
INTRAMUSCULAR | Status: AC
  Filled 2012-04-16: qty 1

## 2012-04-16 MED ORDER — ROCURONIUM BROMIDE 100 MG/10ML IV SOLN
INTRAVENOUS | Status: DC | PRN
  Administered 2012-04-16: 20 mg via INTRAVENOUS
  Administered 2012-04-16: 5 mg via INTRAVENOUS
  Administered 2012-04-16: 25 mg via INTRAVENOUS
  Administered 2012-04-16: 20 mg via INTRAVENOUS
  Administered 2012-04-16 (×2): 10 mg via INTRAVENOUS

## 2012-04-16 MED ORDER — PROMETHAZINE HCL 25 MG/ML IJ SOLN
INTRAMUSCULAR | Status: AC
  Filled 2012-04-16: qty 1

## 2012-04-16 MED ORDER — GLYCOPYRROLATE 0.2 MG/ML IJ SOLN
INTRAMUSCULAR | Status: DC | PRN
  Administered 2012-04-16: .8 mg via INTRAVENOUS

## 2012-04-16 MED ORDER — LACTATED RINGERS IR SOLN
Status: DC | PRN
  Administered 2012-04-16: 1000 mL

## 2012-04-16 MED ORDER — UNJURY VANILLA POWDER: 2.0000 [oz_av] | Freq: Four times a day (QID) | ORAL | Status: DC

## 2012-04-16 MED ORDER — ACETAMINOPHEN 10 MG/ML IV SOLN
1000.0000 mg | Freq: Once | INTRAVENOUS | Status: AC
  Administered 2012-04-16: 1000 mg via INTRAVENOUS
  Filled 2012-04-16: qty 100

## 2012-04-16 MED ORDER — SODIUM CHLORIDE 0.9 % IJ SOLN
INTRAMUSCULAR | Status: DC | PRN
  Administered 2012-04-16: 20 mL

## 2012-04-16 MED ORDER — UNJURY CHICKEN SOUP POWDER: 2.0000 [oz_av] | Freq: Four times a day (QID) | ORAL | Status: DC

## 2012-04-16 MED ORDER — MEPERIDINE HCL 50 MG/ML IJ SOLN: 6.2500 mg | INTRAMUSCULAR | Status: DC | PRN

## 2012-04-16 MED ORDER — HEPARIN SODIUM (PORCINE) 5000 UNIT/ML IJ SOLN
INTRAMUSCULAR | Status: AC
  Filled 2012-04-16: qty 1

## 2012-04-16 MED ORDER — HYDROMORPHONE HCL PF 1 MG/ML IJ SOLN
INTRAMUSCULAR | Status: DC | PRN
  Administered 2012-04-16 (×3): 0.5 mg via INTRAVENOUS

## 2012-04-16 MED ORDER — SUCCINYLCHOLINE CHLORIDE 20 MG/ML IJ SOLN
INTRAMUSCULAR | Status: DC | PRN
  Administered 2012-04-16: 100 mg via INTRAVENOUS

## 2012-04-16 MED ORDER — MIDAZOLAM HCL 5 MG/5ML IJ SOLN
INTRAMUSCULAR | Status: DC | PRN
  Administered 2012-04-16: 2 mg via INTRAVENOUS

## 2012-04-16 MED ORDER — ONDANSETRON HCL 4 MG/2ML IJ SOLN: 4.0000 mg | INTRAMUSCULAR | Status: DC | PRN

## 2012-04-16 MED ORDER — PROMETHAZINE HCL 25 MG/ML IJ SOLN
6.2500 mg | INTRAMUSCULAR | Status: DC | PRN
  Administered 2012-04-16: 12.5 mg via INTRAVENOUS

## 2012-04-16 MED ORDER — HEPARIN SODIUM (PORCINE) 5000 UNIT/ML IJ SOLN
5000.0000 [IU] | Freq: Once | INTRAMUSCULAR | Status: AC
  Administered 2012-04-16: 5000 [IU] via SUBCUTANEOUS

## 2012-04-16 MED ORDER — METOPROLOL SUCCINATE ER 25 MG PO TB24
25.0000 mg | ORAL_TABLET | Freq: Every day | ORAL | Status: DC
  Administered 2012-04-16: 25 mg via ORAL
  Filled 2012-04-16: qty 1

## 2012-04-16 MED ORDER — OXYCODONE-ACETAMINOPHEN 5-325 MG/5ML PO SOLN
5.0000 mL | ORAL | Status: DC | PRN
  Administered 2012-04-17 (×5): 5 mL via ORAL
  Filled 2012-04-16 (×5): qty 5

## 2012-04-16 MED ORDER — NEOSTIGMINE METHYLSULFATE 1 MG/ML IJ SOLN
INTRAMUSCULAR | Status: DC | PRN
  Administered 2012-04-16: 5 mg via INTRAVENOUS

## 2012-04-16 MED ORDER — UNJURY CHOCOLATE CLASSIC POWDER
2.0000 [oz_av] | Freq: Four times a day (QID) | ORAL | Status: DC
  Administered 2012-04-17 (×2): 2 [oz_av] via ORAL

## 2012-04-16 MED ORDER — LIDOCAINE HCL (CARDIAC) 20 MG/ML IV SOLN
INTRAVENOUS | Status: DC | PRN
  Administered 2012-04-16: 30 mg via INTRAVENOUS

## 2012-04-16 MED ORDER — BUPIVACAINE LIPOSOME 1.3 % IJ SUSP
20.0000 mL | Freq: Once | INTRAMUSCULAR | Status: AC
  Administered 2012-04-16: 20 mL
  Filled 2012-04-16: qty 20

## 2012-04-16 MED ORDER — ACETAMINOPHEN 10 MG/ML IV SOLN
INTRAVENOUS | Status: AC
  Filled 2012-04-16: qty 100

## 2012-04-16 MED ORDER — METOPROLOL SUCCINATE ER 25 MG PO TB24
25.0000 mg | ORAL_TABLET | Freq: Every evening | ORAL | Status: DC
  Administered 2012-04-16 – 2012-04-17 (×2): 25 mg via ORAL
  Filled 2012-04-16 (×3): qty 1

## 2012-04-16 MED ORDER — HYDROMORPHONE HCL PF 1 MG/ML IJ SOLN: 0.2500 mg | INTRAMUSCULAR | Status: DC | PRN

## 2012-04-16 MED ORDER — PROPOFOL 10 MG/ML IV BOLUS
INTRAVENOUS | Status: DC | PRN
  Administered 2012-04-16: 200 mg via INTRAVENOUS

## 2012-04-16 MED ORDER — EPHEDRINE SULFATE 50 MG/ML IJ SOLN
INTRAMUSCULAR | Status: DC | PRN
  Administered 2012-04-16: 10 mg via INTRAVENOUS

## 2012-04-16 MED ORDER — PHENYLEPHRINE HCL 10 MG/ML IJ SOLN
INTRAMUSCULAR | Status: DC | PRN
  Administered 2012-04-16: 40 ug via INTRAVENOUS

## 2012-04-16 MED ORDER — HEPARIN SODIUM (PORCINE) 5000 UNIT/ML IJ SOLN
5000.0000 [IU] | Freq: Three times a day (TID) | INTRAMUSCULAR | Status: DC
  Administered 2012-04-16 – 2012-04-18 (×5): 5000 [IU] via SUBCUTANEOUS
  Filled 2012-04-16 (×8): qty 1

## 2012-04-16 MED ORDER — LACTATED RINGERS IV SOLN: INTRAVENOUS | Status: DC

## 2012-04-16 MED ORDER — ONDANSETRON HCL 4 MG/2ML IJ SOLN
INTRAMUSCULAR | Status: DC | PRN
  Administered 2012-04-16: 4 mg via INTRAVENOUS

## 2012-04-16 MED ORDER — CEFOXITIN SODIUM 2 G IV SOLR
2.0000 g | INTRAVENOUS | Status: AC
  Administered 2012-04-16: 2 g via INTRAVENOUS
  Filled 2012-04-16: qty 2

## 2012-04-16 MED ORDER — MORPHINE SULFATE 2 MG/ML IJ SOLN
2.0000 mg | INTRAMUSCULAR | Status: DC | PRN
  Administered 2012-04-16 – 2012-04-17 (×2): 2 mg via INTRAVENOUS
  Filled 2012-04-16 (×2): qty 1

## 2012-04-16 MED ORDER — KCL IN DEXTROSE-NACL 20-5-0.45 MEQ/L-%-% IV SOLN
INTRAVENOUS | Status: DC
  Administered 2012-04-16 – 2012-04-18 (×5): via INTRAVENOUS
  Filled 2012-04-16 (×5): qty 1000

## 2012-04-16 MED ORDER — ACETAMINOPHEN 160 MG/5ML PO SOLN: 650.0000 mg | ORAL | Status: DC | PRN

## 2012-04-16 MED ORDER — LACTATED RINGERS IV SOLN
INTRAVENOUS | Status: DC | PRN
  Administered 2012-04-16 (×3): via INTRAVENOUS

## 2012-04-16 MED ORDER — 0.9 % SODIUM CHLORIDE (POUR BTL) OPTIME
TOPICAL | Status: DC | PRN
  Administered 2012-04-16: 1000 mL

## 2012-04-16 SURGICAL SUPPLY — 59 items
ADH SKN CLS APL DERMABOND .7 (GAUZE/BANDAGES/DRESSINGS) ×2
APL SKNCLS STERI-STRIP NONHPOA (GAUZE/BANDAGES/DRESSINGS) ×2
APPLIER CLIP ROT 10 11.4 M/L (STAPLE)
APR CLP MED LRG 11.4X10 (STAPLE)
BENZOIN TINCTURE PRP APPL 2/3 (GAUZE/BANDAGES/DRESSINGS) ×3 IMPLANT
CABLE HIGH FREQUENCY MONO STRZ (ELECTRODE) IMPLANT
CANISTER SUCTION 2500CC (MISCELLANEOUS) IMPLANT
CLAMP ENDO BABCK 10MM (STAPLE) IMPLANT
CLIP APPLIE ROT 10 11.4 M/L (STAPLE) IMPLANT
CLOTH BEACON ORANGE TIMEOUT ST (SAFETY) ×3 IMPLANT
COVER SURGICAL LIGHT HANDLE (MISCELLANEOUS) ×3 IMPLANT
DECANTER SPIKE VIAL GLASS SM (MISCELLANEOUS) ×3 IMPLANT
DERMABOND ADVANCED (GAUZE/BANDAGES/DRESSINGS) ×1
DERMABOND ADVANCED .7 DNX12 (GAUZE/BANDAGES/DRESSINGS) IMPLANT
DEVICE SUT QUICK LOAD TK 5 (STAPLE) ×2 IMPLANT
DEVICE SUT TI-KNOT TK 5X26 (MISCELLANEOUS) ×2 IMPLANT
DEVICE SUTURE ENDOST 10MM (ENDOMECHANICALS) ×3 IMPLANT
DISSECTOR BLUNT TIP ENDO 5MM (MISCELLANEOUS) ×3 IMPLANT
DRAIN PENROSE 18X1/2 LTX STRL (DRAIN) ×3 IMPLANT
DRAPE LAPAROSCOPIC ABDOMINAL (DRAPES) ×3 IMPLANT
ELECT REM PT RETURN 9FT ADLT (ELECTROSURGICAL) ×3
ELECTRODE REM PT RTRN 9FT ADLT (ELECTROSURGICAL) ×2 IMPLANT
FELT TEFLON 4 X1 (Mesh General) ×3 IMPLANT
FILTER SMOKE EVAC LAPAROSHD (FILTER) IMPLANT
GLOVE BIOGEL M 8.0 STRL (GLOVE) ×3 IMPLANT
GLOVE BIOGEL PI IND STRL 7.0 (GLOVE) IMPLANT
GLOVE BIOGEL PI INDICATOR 7.0 (GLOVE)
GOWN STRL NON-REIN LRG LVL3 (GOWN DISPOSABLE) ×3 IMPLANT
GOWN STRL REIN XL XLG (GOWN DISPOSABLE) ×6 IMPLANT
GRASPER ENDO BABCOCK 10 (MISCELLANEOUS) IMPLANT
GRASPER ENDO BABCOCK 10MM (MISCELLANEOUS)
HAND ACTIVATED (MISCELLANEOUS) ×3 IMPLANT
KIT BASIN OR (CUSTOM PROCEDURE TRAY) ×3 IMPLANT
NS IRRIG 1000ML POUR BTL (IV SOLUTION) ×3 IMPLANT
PENCIL BUTTON HOLSTER BLD 10FT (ELECTRODE) IMPLANT
SCISSORS LAP 5X35 DISP (ENDOMECHANICALS) ×3 IMPLANT
SET IRRIG TUBING LAPAROSCOPIC (IRRIGATION / IRRIGATOR) ×3 IMPLANT
SLEEVE ADV FIXATION 5X100MM (TROCAR) IMPLANT
SLEEVE Z-THREAD 5X100MM (TROCAR) IMPLANT
SOLUTION ANTI FOG 6CC (MISCELLANEOUS) ×3 IMPLANT
STAPLER VISISTAT 35W (STAPLE) ×3 IMPLANT
STRIP CLOSURE SKIN 1/2X4 (GAUZE/BANDAGES/DRESSINGS) IMPLANT
SUT SURGIDAC NAB ES-9 0 48 120 (SUTURE) ×12 IMPLANT
SUT VIC AB 4-0 SH 18 (SUTURE) ×3 IMPLANT
SYR 30ML LL (SYRINGE) ×3 IMPLANT
TIP INNERVISION DETACH 40FR (MISCELLANEOUS) IMPLANT
TIP INNERVISION DETACH 50FR (MISCELLANEOUS) IMPLANT
TIP INNERVISION DETACH 56FR (MISCELLANEOUS) IMPLANT
TIPS INNERVISION DETACH 40FR (MISCELLANEOUS)
TRAY FOLEY CATH 14FRSI W/METER (CATHETERS) ×3 IMPLANT
TRAY LAP CHOLE (CUSTOM PROCEDURE TRAY) ×3 IMPLANT
TROCAR ADV FIXATION 11X100MM (TROCAR) IMPLANT
TROCAR ADV FIXATION 5X100MM (TROCAR) IMPLANT
TROCAR XCEL BLUNT TIP 100MML (ENDOMECHANICALS) IMPLANT
TROCAR XCEL NON-BLD 11X100MML (ENDOMECHANICALS) IMPLANT
TROCAR Z-THREAD FIOS 11X100 BL (TROCAR) ×3 IMPLANT
TROCAR Z-THREAD FIOS 5X100MM (TROCAR) ×6 IMPLANT
TROCAR Z-THREAD SLEEVE 11X100 (TROCAR) IMPLANT
TUBING FILTER THERMOFLATOR (ELECTROSURGICAL) ×3 IMPLANT

## 2012-04-16 NOTE — Anesthesia Preprocedure Evaluation (Signed)
Anesthesia Evaluation  Patient identified by MRN, date of birth, ID band Patient awake    Reviewed: Allergy & Precautions, H&P , NPO status , Patient's Chart, lab work & pertinent test results  Airway Mallampati: II TM Distance: >3 FB Neck ROM: full    Dental No notable dental hx. (+) Teeth Intact and Dental Advisory Given   Pulmonary COPD COPD inhaler, Current Smoker,  Moderate COPD breath sounds clear to auscultation  Pulmonary exam normal       Cardiovascular Exercise Tolerance: Good hypertension, On Medications Rhythm:regular Rate:Normal     Neuro/Psych  Headaches, vertigo negative neurological ROS  negative psych ROS   GI/Hepatic negative GI ROS, Neg liver ROS, GERD-  Medicated and Poorly Controlled,  Endo/Other  negative endocrine ROS  Renal/GU negative Renal ROS  negative genitourinary   Musculoskeletal   Abdominal   Peds  Hematology negative hematology ROS (+)   Anesthesia Other Findings   Reproductive/Obstetrics negative OB ROS                           Anesthesia Physical  Anesthesia Plan  ASA: III  Anesthesia Plan: General   Post-op Pain Management:    Induction:   Airway Management Planned: Oral ETT  Additional Equipment:   Intra-op Plan:   Post-operative Plan:   Informed Consent: I have reviewed the patients History and Physical, chart, labs and discussed the procedure including the risks, benefits and alternatives for the proposed anesthesia with the patient or authorized representative who has indicated his/her understanding and acceptance.   Dental Advisory Given  Plan Discussed with: CRNA and Surgeon  Anesthesia Plan Comments:         Anesthesia Quick Evaluation

## 2012-04-16 NOTE — Transfer of Care (Signed)
Immediate Anesthesia Transfer of Care Note  Patient: Mitchell Baxter  Procedure(s) Performed: Procedure(s) (LRB): LAPAROSCOPIC NISSEN FUNDOPLICATION (N/A) LAPAROSCOPIC REPAIR OF HIATAL HERNIA ()  Patient Location: PACU  Anesthesia Type: General  Level of Consciousness: alert  and responds to stimulation  Airway & Oxygen Therapy: Patient Spontanous Breathing and Patient connected to face mask oxygen  Post-op Assessment: Report given to PACU RN, Post -op Vital signs reviewed and stable and Patient moving all extremities  Post vital signs: Reviewed and stable  Complications: No apparent anesthesia complications

## 2012-04-16 NOTE — Op Note (Signed)
Surgeon: Wenda Low, MD, FACS  Asst:  Ovidio Kin M.D. FACS  Anes:  General  Procedure: Laparoscopic Nissen fundoplication with 2 suture repair of the hiatus posteriorly, 3 suture wrap over a #56 lighted bougie  Diagnosis: Gastroesophageal reflux disease  Complications: None seen  EBL:   5 cc  Description of Procedure:  The patient was taken to room 1 and given general anesthesia. After clipping he was prepped with PCMX and draped sterilely. Foley was inserted and a timeout was performed the abdomen was entered to the left upper quadrant using a 0 5 mm Optiview he had a total of 6 trochars were used only one of which was an 11 mm. These were cross the upper abdomen. Nathanson retractor was used to retract the liver and a small dimple was seen initially but he had a plethora of fat in the foregut. I used a harmonic scalpel to dissect the foregut and free up the junction. I got around posteriorly placed a Penrose drain around the esophagogastric junction. A pledgeted suture was used posteriorly and a simple suture was used closure the esophagus to approximate the crura. A 56 lighted bougie was passed to the GE junction the point . With that in place I had completely freed the cardia doing a good posterior dissection coming up along the crura and able to bring around stomach to create the wrap. He would stay back there without tension.  With the bougie in place and then placed 23 sutures anteriorly giving purchases of the esophagus holding in place with Ty knots. Timeout also used posteriorly with the carotid crural approximation. No bleeding was noted. The port sites were all injected with Exparel and closed with 4-0 Vicryl and Dermabond. Patient are the procedure well taken recovery in satisfactory condition.   [name] Daphine Deutscher, MD, [facility], [place] [phone]

## 2012-04-16 NOTE — Anesthesia Postprocedure Evaluation (Signed)
  Anesthesia Post-op Note  Patient: Mitchell Baxter  Procedure(s) Performed: Procedure(s) (LRB): LAPAROSCOPIC NISSEN FUNDOPLICATION (N/A) LAPAROSCOPIC REPAIR OF HIATAL HERNIA ()  Patient Location: PACU  Anesthesia Type: General  Level of Consciousness: awake and alert   Airway and Oxygen Therapy: Patient Spontanous Breathing  Post-op Pain: mild  Post-op Assessment: Post-op Vital signs reviewed, Patient's Cardiovascular Status Stable, Respiratory Function Stable, Patent Airway and No signs of Nausea or vomiting  Post-op Vital Signs: stable  Complications: No apparent anesthesia complications

## 2012-04-16 NOTE — Progress Notes (Signed)
Pt did fleets enema at [facility] with good results

## 2012-04-17 ENCOUNTER — Inpatient Hospital Stay (HOSPITAL_COMMUNITY): Payer: BC Managed Care – PPO

## 2012-04-17 DIAGNOSIS — K449 Diaphragmatic hernia without obstruction or gangrene: Secondary | ICD-10-CM | POA: Diagnosis not present

## 2012-04-17 LAB — DIFFERENTIAL
Basophils Absolute: 0 10*3/uL (ref 0.0–0.1)
Eosinophils Relative: 1 % (ref 0–5)
Lymphocytes Relative: 20 % (ref 12–46)
Monocytes Absolute: 1.1 10*3/uL — ABNORMAL HIGH (ref 0.1–1.0)
Monocytes Relative: 10 % (ref 3–12)

## 2012-04-17 LAB — CBC
HCT: 43.7 % (ref 39.0–52.0)
MCHC: 33.4 g/dL (ref 30.0–36.0)
MCV: 94.6 fL (ref 78.0–100.0)
RDW: 13.4 % (ref 11.5–15.5)
WBC: 11.1 10*3/uL — ABNORMAL HIGH (ref 4.0–10.5)

## 2012-04-17 NOTE — Progress Notes (Signed)
Patient ID: Mitchell Baxter, male   DOB: 1951-05-18, 61 y.o.   MRN: [id number] [facility] Progress Note:   1 Day Post-Op  Subjective: Mental status is clear Objective: Vital signs in last 24 hours: Temp:  [97.9 F (36.6 C)-98.8 F (37.1 C)] 98.8 F (37.1 C) (06/19 2056) Pulse Rate:  [62-74] 71  (06/19 2056) Resp:  [18-20] 20  (06/19 2056) BP: (112-136)/(70-87) 131/87 mmHg (06/19 2056) SpO2:  [90 %-94 %] 93 % (06/19 2056) Weight:  [228 lb 8 oz (103.647 kg)] 228 lb 8 oz (103.647 kg) (06/18 2153)  Intake/Output from previous day: 06/18 0701 - 06/19 0700 In: 6486.7 [I.V.:6486.7] Out: 1035 [Urine:1010; Blood:25] Intake/Output this shift:    Physical Exam: Work of breathing is  Not labored.  Swallow good  Lab Results:  Results for orders placed during the hospital encounter of 04/16/12 (from the past 48 hour(s))  CBC     Status: Abnormal   Collection Time   04/16/12  3:51 PM      Component Value Range Comment   WBC 13.8 (*) 4.0 - 10.5 K/uL    RBC 4.80  4.22 - 5.81 MIL/uL    Hemoglobin 15.2  13.0 - 17.0 g/dL    HCT 11.9  14.7 - 82.9 %    MCV 93.8  78.0 - 100.0 fL    MCH 31.7  26.0 - 34.0 pg    MCHC 33.8  30.0 - 36.0 g/dL    RDW 56.2  13.0 - 86.5 %    Platelets 235  150 - 400 K/uL   CREATININE, SERUM     Status: Abnormal   Collection Time   04/16/12  3:51 PM      Component Value Range Comment   Creatinine, Ser 1.04  0.50 - 1.35 mg/dL    GFR calc non Af Amer 75 (*) >90 mL/min    GFR calc Af Amer 87 (*) >90 mL/min   CBC     Status: Abnormal   Collection Time   04/17/12  5:25 AM      Component Value Range Comment   WBC 11.1 (*) 4.0 - 10.5 K/uL    RBC 4.62  4.22 - 5.81 MIL/uL    Hemoglobin 14.6  13.0 - 17.0 g/dL    HCT 78.4  69.6 - 29.5 %    MCV 94.6  78.0 - 100.0 fL    MCH 31.6  26.0 - 34.0 pg    MCHC 33.4  30.0 - 36.0 g/dL    RDW 28.4  13.2 - 44.0 %    Platelets 219  150 - 400 K/uL   DIFFERENTIAL     Status: Abnormal   Collection Time   04/17/12  5:25 AM     Component Value Range Comment   Neutrophils Relative 69  43 - 77 %    Neutro Abs 7.7  1.7 - 7.7 K/uL    Lymphocytes Relative 20  12 - 46 %    Lymphs Abs 2.2  0.7 - 4.0 K/uL    Monocytes Relative 10  3 - 12 %    Monocytes Absolute 1.1 (*) 0.1 - 1.0 K/uL    Eosinophils Relative 1  0 - 5 %    Eosinophils Absolute 0.1  0.0 - 0.7 K/uL    Basophils Relative 0  0 - 1 %    Basophils Absolute 0.0  0.0 - 0.1 K/uL     Radiology/Results: Dg Ugi W/water Sol Cm  04/17/2012  *RADIOLOGY REPORT*  Clinical Data:  Post Nissen fundoplication.  UPPER GI SERIES WITH KUB  Technique:  Routine upper GI series was performed with water soluble barium.  Fluoroscopy Time: 1.18 minutes  Contrast:  65 ml Omnipaque-300 administered.  Comparison:  12/22/2011  Findings: Initial KUB radiograph demonstrates a nonobstructive bowel gas pattern. Following contrast ingestion, the distal esophagus tapers smoothly through the Nissen wrap with expected postoperative changes of the gastric fundus.  No extravasation of ingested contrast.  No evidence for wrap migration.  There are two round radiopaque densities in proximity to the anastomoses that persisted throughout the examination. .  IMPRESSION: Expected postoperative changes following Nissen fundoplication.  Original Report Authenticated By: Waneta Martins, M.D.    Anti-infectives: Anti-infectives     Start     Dose/Rate Route Frequency Ordered Stop   04/16/12 0754   cefOXitin (MEFOXIN) 2 g in dextrose 5 % 50 mL IVPB        2 g 100 mL/hr over 30 Minutes Intravenous 60 min pre-op 04/16/12 0754 04/16/12 0955          Assessment/Plan: Problem List: Patient Active Problem List  Diagnosis  . Chronic headaches  . HTN (hypertension)  . High cholesterol  . Depression  . GERD (gastroesophageal reflux disease)    Doing well.  Started liquids 1 Day Post-Op    LOS: 1 day   [name] Daphine Deutscher, MD, [facility] [phone]  beeper [phone]  04/17/2012 9:46 PM

## 2012-04-17 NOTE — Progress Notes (Signed)
UR complete 

## 2012-04-17 NOTE — Progress Notes (Signed)
Dr. Daphine Deutscher rounded on pt. Clarification of diet received.

## 2012-04-18 DIAGNOSIS — K449 Diaphragmatic hernia without obstruction or gangrene: Secondary | ICD-10-CM | POA: Diagnosis not present

## 2012-04-18 MED ORDER — OXYCODONE-ACETAMINOPHEN 5-325 MG/5ML PO SOLN: 5.0000 mL | ORAL | Status: DC | PRN

## 2012-04-18 NOTE — Progress Notes (Signed)
D/c instructions given and explained to pt.  Prescription for roxicet given to pt.  Iv site d/c, pressure dressing applied to site.  Pt awaiting ride to get here. Pt alert and stable. Ready for d/c.

## 2012-04-18 NOTE — Discharge Instructions (Signed)
Nissen Fundoplication Care After Please read the instructions outlined below and refer to this sheet for the next few weeks. These discharge instructions provide you with general information on caring for yourself after you leave the hospital. Your doctor may also give you specific instructions. While your treatment has been planned according to the most current medical practices available, unavoidable complications sometimes happen. If you have any problems or questions after discharge, please call your doctor. ACTIVITY  Take frequent rest periods throughout the day.   Take frequent walks throughout the day. This will help to prevent blood clots.   Continue to do your coughing and deep breathing exercises once you get home. This will help to prevent pneumonia.   No strenuous activities such as heavy lifting, pushing or pulling until after your follow-up visit with your doctor. Do not lift anything heavier than 10 pounds.   Talk with your caregiver about when you may return to work and your exercise routine.   You may shower 2 days after surgery. Pat incisions dry. Do not rub incisions with washcloth or towel.   Do not drive while taking prescription pain medication.  NUTRITION  Continue with a liquid diet, or the diet you were directed to take, until your first follow-up visit with your surgeon.   Drink fluids (6-8 glasses a day).   Call your caregiver for persistent nausea (feeling sick to your stomach), vomiting, bloating or difficulty swallowing.  ELIMINATION It is very important not to strain during bowel movements. If constipation should occur, you may:  Take a mild laxative (such as Milk of Magnesia).   Add fruit and bran to your diet.   Drink more fluids.   Call your caregiver if constipation is not relieved.  FEVER If you feel feverish or have shaking chills, take your temperature. If it is 102 F (38.9 C) or above, call your caregiver. The fever may mean there is an  infection. PAIN CONTROL  If a prescription was given for a pain reliever, please follow your caregiver's directions.   Only take over-the-counter or prescription medicines for pain, discomfort, or fever as directed by your caregiver.   If the pain is not relieved by your medicine, becomes worse, or you have difficulty breathing, call your doctor.  INCISION  It is normal for your cuts (incisions) from surgery to have a small amount of drainage for the first 1-2 days. Once the drainage has stopped, leave your incision(s) open to air.   Check your incision(s) and surrounding area daily for any redness, swelling, increased drainage or bleeding. If any of these are present or if the wound edges start to separate, call your doctor.   If you have small adhesive strips in place, they will peel and fall off. (If these strips are covered with a clear bandage, your doctor will tell you when to remove them.)   If you have staples, your caregiver will remove them at the follow-up appointment.  Document Released: 06/08/2004 Document Revised: 10/05/2011 Document Reviewed: 09/12/2007 [facility] Patient Information 2012 [place], [place].

## 2012-04-18 NOTE — Discharge Summary (Signed)
Physician Discharge Summary  Patient ID: Mitchell Baxter MRN: [id number] DOB/AGE: September 03, 1951 61 y.o.  Admit date: 04/16/2012 Discharge date: 04/18/2012  Admission Diagnoses:  GERD  Discharge Diagnoses:  same  Active Problems:  * No active hospital problems. *    Surgery:  Laparoscopic repair of hiatus hernia with Nissen fundoplication over a 56 bougie; 3 suture wrap, 2 suture posterior repair  Discharged Condition: improved  Hospital Course:   Had surgery.  PD 1 had swallow which showed no leak.  Begun on liquids and ready for discharge on PD 2  Consults: none  Significant Diagnostic Studies: UGI    Discharge Exam: Blood pressure 130/80, pulse 66, temperature 98.4 F (36.9 C), temperature source Oral, resp. rate 18, height 6' (1.829 m), weight 228 lb 8 oz (103.647 kg), SpO2 93.00%. Incisions ok.  Pain controlled well.    Disposition: 01-Home or Self Care  Discharge Orders    Future Appointments: Provider: Department: Dept Phone: Center:   05/24/2012 4:30 PM Valarie Merino, MD [facility] [phone] None     Future Orders Please Complete By Expires   Diet - low sodium heart healthy      Increase activity slowly      Discharge instructions      Comments:   Liquid diet for 1 week followed by a pureed diet for 4 weeks   Lifting restrictions      Comments:   Avoid straining when lifting   No dressing needed        Medication List  As of 04/18/2012  8:09 AM   STOP taking these medications         esomeprazole 40 MG capsule      metoCLOPramide 5 MG tablet         TAKE these medications         aspirin 81 MG tablet   Take 160 mg by mouth daily.      buPROPion 150 MG 24 hr tablet   Commonly known as: WELLBUTRIN XL   Take 150 mg by mouth every evening.      diphenhydrAMINE 25 MG tablet   Commonly known as: BENADRYL   Take 25 mg by mouth every 6 (six) hours as needed. allergies      FLUoxetine 40 MG capsule   Commonly known as: PROZAC   Take 40 mg by  mouth every evening.      metoprolol succinate 25 MG 24 hr tablet   Commonly known as: TOPROL-XL   Take 25 mg by mouth every evening.      oxyCODONE-acetaminophen 5-325 MG/5ML solution   Commonly known as: ROXICET   Take 5-10 mLs by mouth every 4 (four) hours as needed.           Follow-up Information    Follow up with Welda Azzarello B, MD in 4 weeks.   Contact information:   [employer] [address], [address] [place] [address] [phone]          Signed: Valarie Merino 04/18/2012, 8:09 AM

## 2012-04-19 ENCOUNTER — Encounter (HOSPITAL_COMMUNITY): Payer: Self-pay | Admitting: Surgery

## 2012-05-10 ENCOUNTER — Telehealth (INDEPENDENT_AMBULATORY_CARE_PROVIDER_SITE_OTHER): Payer: Self-pay | Admitting: General Surgery

## 2012-05-10 MED ORDER — CHLORPROMAZINE HCL 25 MG PO TABS: 25.0000 mg | ORAL_TABLET | Freq: Three times a day (TID) | ORAL | Status: DC

## 2012-05-10 NOTE — Telephone Encounter (Signed)
Spoke with [name], patient's wife, re: his choking sensations and hiccups and also his lack of bowel movements. Per Dr Johna Sheriff I advised to increase his miralax to take this every day to help move his bowels. Can call in chlorpromazine 25 mg po tid as needed for hiccups. I advised for patient to eat small meals more frequently and not three meals. She will let us know if this does not help.

## 2012-05-24 ENCOUNTER — Encounter (INDEPENDENT_AMBULATORY_CARE_PROVIDER_SITE_OTHER): Payer: Self-pay | Admitting: Surgery

## 2012-05-24 ENCOUNTER — Ambulatory Visit (INDEPENDENT_AMBULATORY_CARE_PROVIDER_SITE_OTHER): Payer: BC Managed Care – PPO | Admitting: Surgery

## 2012-05-24 VITALS — BP 128/80 | HR 84 | Temp 98.4°F | Resp 14 | Ht 73.0 in | Wt 220.2 lb

## 2012-05-24 DIAGNOSIS — Z09 Encounter for follow-up examination after completed treatment for conditions other than malignant neoplasm: Secondary | ICD-10-CM

## 2012-05-24 DIAGNOSIS — Z9889 Other specified postprocedural states: Secondary | ICD-10-CM | POA: Insufficient documentation

## 2012-05-24 NOTE — Patient Instructions (Addendum)
Miralax purge this weekend

## 2012-05-24 NOTE — Progress Notes (Signed)
Mitchell Baxter comes in today and he has been having problems with constipation. We discussed that I recommended he take MiraLax to really can't get a good clean out. Also mentioned not wearing such tightfitting blue jeans as I can sometimes have an adverse effect on his abdomen but later in the day.  He's also having some dysphasia and each periods about 5 weeks out and it may be the seeing a little bit too fast. I encouraged him to slow down his eating she was food better and things were treated continue to get better we'll with recurring dysphasia the next 2-3 weeks. I will see him back in 2 months

## 2012-06-28 ENCOUNTER — Ambulatory Visit (INDEPENDENT_AMBULATORY_CARE_PROVIDER_SITE_OTHER): Payer: BC Managed Care – PPO | Admitting: Surgery

## 2012-06-28 ENCOUNTER — Encounter (INDEPENDENT_AMBULATORY_CARE_PROVIDER_SITE_OTHER): Payer: Self-pay | Admitting: Surgery

## 2012-06-28 VITALS — BP 152/88 | HR 76 | Temp 97.1°F | Resp 14 | Ht 73.0 in | Wt 217.0 lb

## 2012-06-28 DIAGNOSIS — R1310 Dysphagia, unspecified: Secondary | ICD-10-CM

## 2012-06-28 NOTE — Patient Instructions (Signed)
Have UGI series Stay on warmer liquids and chew slowly  Thanks for your patience.  If you need further assistance after leaving the office, please call our office and speak with a [facility] nurse.  [phone].  If you want to leave a message for Dr. Daphine Deutscher, please call his office phone at [phone].

## 2012-06-28 NOTE — Progress Notes (Signed)
Mitchell Baxter 61 y.o.  Body mass index is 28.63 kg/(m^2).  Patient Active Problem List  Diagnosis  . Chronic headaches  . HTN (hypertension)  . High cholesterol  . Depression  . GERD (gastroesophageal reflux disease)  . Lap Nissen June 2013    No Known Allergies  Past Surgical History  Procedure Date  . None   . Bravo ph study 11/29/2011    Procedure: BRAVO PH STUDY;  Surgeon: Freddy Jaksch, MD;  Location: [facility];  Service: Endoscopy;  Laterality: N/A;  . Laparoscopic nissen fundoplication 04/16/2012    Procedure: LAPAROSCOPIC NISSEN FUNDOPLICATION;  Surgeon: Valarie Merino, MD;  Location: [facility] ORS;  Service: General;  Laterality: N/A;  . Hiatal hernia repair 04/16/2012    Procedure: LAPAROSCOPIC REPAIR OF HIATAL HERNIA;  Surgeon: Valarie Merino, MD;  Location: [facility] ORS;  Service: Denman George, NP 1. Dysphagia     Having dysphagia.  Is having some pain swallowing liquids. Continuing To have dysphasia and coughing at night possibly related pooling. To me this suggests a motility issues. I think we will need to get an upper GI series.  Assess motility as well as tightness of wrap.    [name], MD, [facility] [phone] beeper [phone]  06/28/2012 10:15 AM

## 2012-07-03 ENCOUNTER — Ambulatory Visit
Admission: RE | Admit: 2012-07-03 | Discharge: 2012-07-03 | Disposition: A | Discharge status: Home / Self Care | Payer: BC Managed Care – PPO | Source: Ambulatory Visit | Attending: Surgery | Admitting: Surgery

## 2012-07-03 DIAGNOSIS — R1310 Dysphagia, unspecified: Secondary | ICD-10-CM

## 2012-07-17 ENCOUNTER — Encounter (INDEPENDENT_AMBULATORY_CARE_PROVIDER_SITE_OTHER): Payer: BC Managed Care – PPO | Admitting: Surgery

## 2012-08-01 ENCOUNTER — Encounter (INDEPENDENT_AMBULATORY_CARE_PROVIDER_SITE_OTHER): Payer: BC Managed Care – PPO | Admitting: Surgery

## 2012-08-08 ENCOUNTER — Other Ambulatory Visit (INDEPENDENT_AMBULATORY_CARE_PROVIDER_SITE_OTHER): Payer: Self-pay | Admitting: Surgery

## 2012-08-08 DIAGNOSIS — R1310 Dysphagia, unspecified: Secondary | ICD-10-CM

## 2012-08-08 MED ORDER — METOCLOPRAMIDE HCL 5 MG PO TABS: 5.0000 mg | ORAL_TABLET | Freq: Four times a day (QID) | ORAL | Status: DC

## 2012-09-19 ENCOUNTER — Other Ambulatory Visit (INDEPENDENT_AMBULATORY_CARE_PROVIDER_SITE_OTHER): Payer: Self-pay | Admitting: Surgery

## 2012-09-19 DIAGNOSIS — R1310 Dysphagia, unspecified: Secondary | ICD-10-CM

## 2012-09-19 MED ORDER — METOCLOPRAMIDE HCL 5 MG PO TABS: 5.0000 mg | ORAL_TABLET | Freq: Four times a day (QID) | ORAL | Status: DC

## 2012-10-03 ENCOUNTER — Encounter (HOSPITAL_COMMUNITY): Payer: Self-pay | Admitting: *Deleted

## 2012-10-03 ENCOUNTER — Ambulatory Visit (HOSPITAL_COMMUNITY)
Admission: RE | Admit: 2012-10-03 | Discharge: 2012-10-03 | Disposition: A | Discharge status: Home / Self Care | Payer: BC Managed Care – PPO | Source: Ambulatory Visit | Attending: Surgery | Admitting: Surgery

## 2012-10-03 ENCOUNTER — Encounter (HOSPITAL_COMMUNITY)
Admission: RE | Disposition: A | Discharge status: Home / Self Care | Payer: Self-pay | Source: Ambulatory Visit | Attending: Surgery

## 2012-10-03 DIAGNOSIS — Z860100 Personal history of colon polyps, unspecified: Secondary | ICD-10-CM | POA: Insufficient documentation

## 2012-10-03 DIAGNOSIS — K644 Residual hemorrhoidal skin tags: Secondary | ICD-10-CM | POA: Insufficient documentation

## 2012-10-03 DIAGNOSIS — K219 Gastro-esophageal reflux disease without esophagitis: Secondary | ICD-10-CM

## 2012-10-03 DIAGNOSIS — F17200 Nicotine dependence, unspecified, uncomplicated: Secondary | ICD-10-CM | POA: Insufficient documentation

## 2012-10-03 DIAGNOSIS — I10 Essential (primary) hypertension: Secondary | ICD-10-CM | POA: Insufficient documentation

## 2012-10-03 DIAGNOSIS — R1310 Dysphagia, unspecified: Secondary | ICD-10-CM | POA: Insufficient documentation

## 2012-10-03 DIAGNOSIS — R141 Gas pain: Secondary | ICD-10-CM | POA: Insufficient documentation

## 2012-10-03 DIAGNOSIS — R142 Eructation: Secondary | ICD-10-CM | POA: Insufficient documentation

## 2012-10-03 HISTORY — DX: Shortness of breath: R06.02

## 2012-10-03 HISTORY — PX: COLONOSCOPY: SHX5424

## 2012-10-03 SURGERY — COLONOSCOPY
Anesthesia: Moderate Sedation

## 2012-10-03 MED ORDER — FENTANYL CITRATE 0.05 MG/ML IJ SOLN
INTRAMUSCULAR | Status: DC | PRN
  Administered 2012-10-03 (×3): 25 ug via INTRAVENOUS

## 2012-10-03 MED ORDER — MIDAZOLAM HCL 5 MG/5ML IJ SOLN
INTRAMUSCULAR | Status: DC | PRN
  Administered 2012-10-03 (×3): 2 mg via INTRAVENOUS

## 2012-10-03 MED ORDER — FENTANYL CITRATE 0.05 MG/ML IJ SOLN
INTRAMUSCULAR | Status: AC
  Filled 2012-10-03: qty 4

## 2012-10-03 MED ORDER — MIDAZOLAM HCL 10 MG/2ML IJ SOLN
INTRAMUSCULAR | Status: AC
  Filled 2012-10-03: qty 4

## 2012-10-03 NOTE — Op Note (Signed)
10/03/2012  10:38 AM  PATIENT:  Mitchell Baxter, 61 y.o., male, MRN: [id number]  PREOP DIAGNOSIS:  History of bloating, history of colonic polyps, last colonoscopy > 10 years  POSTOP DIAGNOSIS:   Normal colon, external hemorrhoids  PROCEDURE:   Procedure(s): COLONOSCOPY  SURGEON:   Ovidio Kin, M.D.   ANESTHESIA:   IV sedation  Moderate Sedation  75 mcgm of Fentanyl, 6 mg of Versed  SPECIMEN:   None  INDICATIONS FOR PROCEDURE:  Mitchell Baxter is a 61 y.o. (DOB: February 21, 1951) white male whose primary care physician is [facility], NP and comes for colonoscopy.   The indications and risks of colonoscopy were explained to the patient.  The risks include, but are not limited to, perforation of the bowel and bleeding.  OPERATIVE NOTE:  The patient was taken to room # 3 in the [facility] endoscopy suite.  The patient was monitored with pulse oximetry, blood pressure cuff, and cardiac monitor.  The had 3 liters of nasal O2 during the procedure.  A time out was held and the checklist reviewed.   The patient was sedated with 75 mcgm of Fentanyl and 6 mgm of Versed.   A digital rectal exam was done at the beginning of the procedure..  The anus and rectum were unremarkable.  He does have some external hemorrhoids.   The flexible Pentax colonoscope was passed up the rectum without difficulty.  The scope was advanced to the cecum and the ileocecal valve was identified.  The colonic prep was good.   The right colon, transverse colon, left colon, and sigmoid colon were unremarkable.  Photos were taken.   The scope was withdrawn into the rectum and retroflexed.  The rectum was unremarkable.  Photos were taken during the procedure and placed in the chart.   The patient was taken to the recovery area of the [facility] endoscopy in good condition.  His wife was with the patient and I explained the findings of the procedure.   The patient's next colonoscopy should be in 10 years, unless he has new symptoms.  Ovidio Kin, MD, [facility] Pager: [phone] Office phone:  [phone]

## 2012-10-03 NOTE — H&P (Signed)
  Progress Notes     Mitchell Baxter 61 y.o. Body mass index is 28.63 kg/(m^2).   Abbie Sons, NP as his primary medical provider.     Patient Active Problem List     Diagnosis     .  Chronic headaches - has migraine HA       He says that no one can do anything for them.    Marland Kitchen  HTN (hypertension)     .  High cholesterol     .  Depression     .  GERD (gastroesophageal reflux disease)     .  Lap Nissen June 2013      No Known Allergies     Past Surgical History     Procedure  Date     .  None      .  Bravo ph study  11/29/2011       Procedure: BRAVO PH STUDY; Surgeon: Freddy Jaksch, MD; Location: [facility]; Service: Endoscopy; Laterality: N/A;     .  Laparoscopic nissen fundoplication  04/16/2012       Procedure: LAPAROSCOPIC NISSEN FUNDOPLICATION; Surgeon: Valarie Merino, MD; Location: [facility]; Service: General; Laterality: N/A;     .  Hiatal hernia repair  04/16/2012       Procedure: LAPAROSCOPIC REPAIR OF HIATAL HERNIA; Surgeon: Valarie Merino, MD; Location: [facility]; Service: Denman George, NP      1.  Dysphagia     Having dysphagia. Is having some pain swallowing liquids. Continuing To have dysphasia and coughing at night possibly related pooling. To me this suggests a motility issues. I think we will need to get an upper GI series. Assess motility as well as tightness of wrap.   2.  Smokes 1 ppd  3.  History of prior colonoscopy > 10 years ago.  He said they found some polyps.  No malignancy.  4.  Patient continues to have bloating and a "grumbling" abdomen.  Plan:  Colonoscopy           I reviewed with the patient the indications and complications of a colonoscopy.  The primary complications, include, but are not limited to bleeding and perforation.  The patient was given literature about colonoscopy.         The patient took a mechanical bowel prep.         His wife is with him here today.     Ovidio Kin, MD, [facility]  Surgery Pager: [phone] Office phone:  [phone]

## 2012-10-04 ENCOUNTER — Telehealth (INDEPENDENT_AMBULATORY_CARE_PROVIDER_SITE_OTHER): Payer: Self-pay

## 2012-10-04 ENCOUNTER — Encounter (HOSPITAL_COMMUNITY): Payer: Self-pay | Admitting: Surgery

## 2012-10-04 NOTE — Telephone Encounter (Signed)
I called and checked on the pt s/p colonoscopy yesterday.  He is doing fine and doesn't need a follow up at this time.

## 2013-06-09 ENCOUNTER — Other Ambulatory Visit: Payer: Self-pay | Admitting: Nurse Practitioner

## 2013-06-09 DIAGNOSIS — R202 Paresthesia of skin: Secondary | ICD-10-CM

## 2013-06-09 DIAGNOSIS — R109 Unspecified abdominal pain: Secondary | ICD-10-CM

## 2013-06-09 DIAGNOSIS — F17200 Nicotine dependence, unspecified, uncomplicated: Secondary | ICD-10-CM

## 2013-06-10 ENCOUNTER — Ambulatory Visit
Admission: RE | Admit: 2013-06-10 | Discharge: 2013-06-10 | Disposition: A | Discharge status: Home / Self Care | Payer: BC Managed Care – PPO | Source: Ambulatory Visit | Attending: Nurse Practitioner | Admitting: Nurse Practitioner

## 2013-06-10 DIAGNOSIS — F17200 Nicotine dependence, unspecified, uncomplicated: Secondary | ICD-10-CM

## 2013-06-10 DIAGNOSIS — R109 Unspecified abdominal pain: Secondary | ICD-10-CM

## 2013-06-10 DIAGNOSIS — R200 Anesthesia of skin: Secondary | ICD-10-CM

## 2013-06-10 MED ORDER — IOHEXOL 300 MG/ML  SOLN
125.0000 mL | Freq: Once | INTRAMUSCULAR | Status: AC | PRN
  Administered 2013-06-10: 125 mL via INTRAVENOUS

## 2013-06-12 ENCOUNTER — Other Ambulatory Visit: Payer: Self-pay | Admitting: Nurse Practitioner

## 2013-06-12 DIAGNOSIS — N281 Cyst of kidney, acquired: Secondary | ICD-10-CM

## 2013-06-13 ENCOUNTER — Ambulatory Visit
Admission: RE | Admit: 2013-06-13 | Discharge: 2013-06-13 | Disposition: A | Discharge status: Home / Self Care | Payer: BC Managed Care – PPO | Source: Ambulatory Visit | Attending: Nurse Practitioner | Admitting: Nurse Practitioner

## 2013-06-13 DIAGNOSIS — N281 Cyst of kidney, acquired: Secondary | ICD-10-CM

## 2013-06-13 MED ORDER — GADOBENATE DIMEGLUMINE 529 MG/ML IV SOLN
20.0000 mL | Freq: Once | INTRAVENOUS | Status: AC | PRN
  Administered 2013-06-13: 20 mL via INTRAVENOUS

## 2015-02-09 DIAGNOSIS — R42 Dizziness and giddiness: Secondary | ICD-10-CM | POA: Diagnosis not present

## 2015-02-09 DIAGNOSIS — F328 Other depressive episodes: Secondary | ICD-10-CM | POA: Diagnosis not present

## 2015-02-09 DIAGNOSIS — M5430 Sciatica, unspecified side: Secondary | ICD-10-CM | POA: Diagnosis not present

## 2015-02-09 DIAGNOSIS — M2550 Pain in unspecified joint: Secondary | ICD-10-CM | POA: Diagnosis not present

## 2015-02-09 DIAGNOSIS — E559 Vitamin D deficiency, unspecified: Secondary | ICD-10-CM | POA: Diagnosis not present

## 2015-02-09 DIAGNOSIS — E780 Pure hypercholesterolemia: Secondary | ICD-10-CM | POA: Diagnosis not present

## 2015-02-09 DIAGNOSIS — F064 Anxiety disorder due to known physiological condition: Secondary | ICD-10-CM | POA: Diagnosis not present

## 2015-02-09 DIAGNOSIS — I10 Essential (primary) hypertension: Secondary | ICD-10-CM | POA: Diagnosis not present

## 2015-08-10 DIAGNOSIS — M79604 Pain in right leg: Secondary | ICD-10-CM | POA: Diagnosis not present

## 2015-08-10 DIAGNOSIS — H6691 Otitis media, unspecified, right ear: Secondary | ICD-10-CM | POA: Diagnosis not present

## 2015-08-10 DIAGNOSIS — H6980 Other specified disorders of Eustachian tube, unspecified ear: Secondary | ICD-10-CM | POA: Diagnosis not present

## 2015-08-10 DIAGNOSIS — H6121 Impacted cerumen, right ear: Secondary | ICD-10-CM | POA: Diagnosis not present

## 2015-08-10 DIAGNOSIS — R42 Dizziness and giddiness: Secondary | ICD-10-CM | POA: Diagnosis not present

## 2015-08-25 ENCOUNTER — Encounter: Payer: Self-pay | Admitting: Neurology

## 2015-08-25 ENCOUNTER — Ambulatory Visit (INDEPENDENT_AMBULATORY_CARE_PROVIDER_SITE_OTHER): Payer: Medicare Other | Admitting: Neurology

## 2015-08-25 VITALS — BP 117/71 | HR 79 | Ht 72.0 in | Wt 226.8 lb

## 2015-08-25 DIAGNOSIS — J328 Other chronic sinusitis: Secondary | ICD-10-CM | POA: Diagnosis not present

## 2015-08-25 DIAGNOSIS — R251 Tremor, unspecified: Secondary | ICD-10-CM

## 2015-08-25 DIAGNOSIS — G450 Vertebro-basilar artery syndrome: Secondary | ICD-10-CM | POA: Diagnosis not present

## 2015-08-25 DIAGNOSIS — R51 Headache: Secondary | ICD-10-CM

## 2015-08-25 DIAGNOSIS — R42 Dizziness and giddiness: Secondary | ICD-10-CM | POA: Diagnosis not present

## 2015-08-25 DIAGNOSIS — R404 Transient alteration of awareness: Secondary | ICD-10-CM

## 2015-08-25 DIAGNOSIS — H9190 Unspecified hearing loss, unspecified ear: Secondary | ICD-10-CM | POA: Diagnosis not present

## 2015-08-25 DIAGNOSIS — H93A9 Pulsatile tinnitus, unspecified ear: Secondary | ICD-10-CM

## 2015-08-25 DIAGNOSIS — H539 Unspecified visual disturbance: Secondary | ICD-10-CM | POA: Diagnosis not present

## 2015-08-25 DIAGNOSIS — J329 Chronic sinusitis, unspecified: Secondary | ICD-10-CM | POA: Insufficient documentation

## 2015-08-25 DIAGNOSIS — R519 Headache, unspecified: Secondary | ICD-10-CM

## 2015-08-25 DIAGNOSIS — R4182 Altered mental status, unspecified: Secondary | ICD-10-CM | POA: Insufficient documentation

## 2015-08-25 MED ORDER — DIVALPROEX SODIUM ER 500 MG PO TB24: 500.0000 mg | ORAL_TABLET | Freq: Every day | ORAL | Status: DC

## 2015-08-25 NOTE — Progress Notes (Signed)
[facility]    Provider:  Dr Jaynee Eagles Referring Provider: Delia Chimes, NP Primary Care Physician:  Delia Chimes, NP  CC:  Dizziness, vertigo and intractable headache.  HPI:  Mitchell Baxter is a 64 y.o. male here as a referral from Dr. Toy Cookey for dizziness, intractable headache. He has a past medical history of chronic headache, migraine, depression, anxiety, high cholesterol, hypertension, vertigo, COPD, sleep apnea.   He has been having spells of vertigo, started 6 months ago. It happens when he is outside. He happened one time driving on turning head, once when standing in line. It lasted 5 minutes, with associated nausea, no vomiting. If he lays still it goes away. He has sinus congestion and drainage and coughing. He has very bad itching in the ears. He has lost hearing in both ears. No ear fullness. Vertigo happens once a week. Getting worse. His head feels funny and cloudy. He has sinus issues, sinus drainage, he is sneezing.  He was outside 2 weeks ago and he bent over, his arm started shaking, he fell over. He has been having bad shakes. His hands shake.  He did not lose consciousness. He fell over, he was laying on the group, and his arm was shaking very bad. He bent down to look at something and he just kept going, there was no vertigo, landed on the ground, no urination or defecation, he was on the ground for a minute, his head did "not feel right" his brain feels spongy and fuzzy. His head hurts all the time. His voice is slurred recently.  Several years ago he would cough very hard and black out but that has not happened in a long time. He was seen by r Love in the past and was diagnosed with rebound migraines and also has headaches. Her patient Dr. love was not able to find anything to help him with his headaches and these headaches are chronic for years. He takes neurontin for neuropathy in the legs. No recent illnesses, no infection, no fever. His right ear was  impacted. He has chronic headaches, daily headaches, the pain starts in the back of the neck, pressure headaches around the head. He used to have migraines and the headaches have transformed into what it is now for the last several years. He wakes up with headaches. No snoring. He likes to sleep all day, wife says there is depression that is not well treated. He is having blurry vision and vision changes.   Patient has had headaches since the 1980s and he was seen at a headache clinic and took medications in the 1980s without benefits. He was also seen by several providers in 2011 here at [facility] and started on multiple medications without benefit for his headaches.  Reviewed notes, labs and imaging from outside physicians, which showed: Patient referred here by primary care for lightheaded and dizziness over the last month. Patient was in [employer] at 1. felt dizzy and sweaty denied need chest pain. Denied panic attack. Unable to move head without becoming dizzy with some nausea, no vomiting.  Personally reviewed images of MRI of the brain in April 2010 which was normal.  Review previous notes for Dr. Jeneen Rinks love back in 2011. Patient was seen by Dr. love for headaches initially in June 2011. MRI of the brain and intracranial MRA in 2007 were normal. He was seen for headaches in the posterior head regions mostly on the right and then daily for 6 weeks. Headaches were nonstop.  Nothing made them better or worse. No change with bending over cough sneeze or Valsalva. There were no associated visual changes. Dr. love noted a past medical history of migraines. He was started on diclofenac and Topiramate. CBC and CMP were unremarkable, [facility] spotted fever IgG was negative, [facility] spotted fever IgM was negative, ESR was normal, Lyme IgG and IgM were both negative at the time. He was later seen by Dr. Cloretta Ned NP for follow-up in August 2011 in our office. Patient had  improvement on the diclofenac and Topamax for 2 weeks then the headaches returned. Topamax was increased in a daily muscle relaxant was provided. At the next follow-up in April 2012 he had tried Imitrex which caused chest pain. He was also taking over-the-counter medication approximately 40 month. Apparently he was started on Depakote at that time but stopped it without explanation. At that time he was suggested to restart Depakote and obtain sleep study and stop over-the-counter pain meds.  Review of Systems: Patient complains of symptoms per HPI as well as the following symptoms: Chills, fatigue, blurred vision, palpitations, hearing loss, spinning sensation, constipation, joint pain, aching muscles, confusion, headache, slurred speech, dizziness, passing out, tremor, restless legs, depression, too much sleep, decreased energy, disinterest in activities. Pertinent negatives per HPI. All others negative.   Social History   Social History  . Marital Status: Married    Spouse Name: Jan  . Number of Children: 2  . Years of Education: 12   Occupational History  . Self-employed    Social History Main Topics  . Smoking status: Current Every Day Smoker -- 1.00 packs/day for 40 years    Types: Cigarettes  . Smokeless tobacco: Never Used  . Alcohol Use: Yes     Comment: occasional  . Drug Use: No  . Sexual Activity: Not on file   Other Topics Concern  . Not on file   Social History Narrative   Lives at home with his wife.   Caffeine use: 2-3 cups daily of coffee/soda    Family History  Problem Relation Age of Onset  . Anesthesia problems Neg Hx   . Hypotension Neg Hx   . Malignant hyperthermia Neg Hx   . Pseudochol deficiency Neg Hx   . Migraines Neg Hx   . Colon cancer Father     pancreatic    Past Medical History  Diagnosis Date  . Hypertension   . Hyperlipidemia   . Depression   . GERD (gastroesophageal reflux disease)   . Vertigo   . COPD (chronic obstructive pulmonary  disease) ([place])   . Headache(784.0)     migraine  . Sleep apnea     stopbang=4  . Shortness of breath     exertion    Past Surgical History  Procedure Laterality Date  . None    . Bravo ph study  11/29/2011    Procedure: BRAVO [place] STUDY;  Surgeon: Landry Dyke, MD;  Location: [facility];  Service: Endoscopy;  Laterality: N/A;  . Laparoscopic nissen fundoplication  2/84/1324    Procedure: LAPAROSCOPIC NISSEN FUNDOPLICATION;  Surgeon: Pedro Earls, MD;  Location: [facility];  Service: General;  Laterality: N/A;  . Hiatal hernia repair  04/16/2012    Procedure: LAPAROSCOPIC REPAIR OF HIATAL HERNIA;  Surgeon: Pedro Earls, MD;  Location: [facility];  Service: General;;  . Colonoscopy  10/03/2012    Procedure: COLONOSCOPY;  Surgeon: Shann Medal, MD;  Location: [facility];  Service: General;  Laterality:  N/A;  carol/leone    Current Outpatient Prescriptions  Medication Sig Dispense Refill  . aspirin 81 MG tablet Take 160 mg by mouth daily.    . Cholecalciferol (VITAMIN D3) 5000 UNITS TABS Take 1 tablet by mouth daily.    . Cyanocobalamin (VITAMIN B-12 PO) Take 5,000 Units by mouth daily.    Marland Kitchen FLUoxetine (PROZAC) 40 MG capsule Take 40 mg by mouth every evening.     . gabapentin (NEURONTIN) 300 MG capsule Take 300 mg by mouth every morning.  6  . meclizine (ANTIVERT) 25 MG tablet Take 25 mg by mouth as needed.  0  . Probiotic Product (PROBIOTIC PO) Take 1 tablet by mouth daily.    . valsartan-hydrochlorothiazide (DIOVAN-HCT) 160-12.5 MG tablet Take 1 tablet by mouth every morning.  6  . divalproex (DEPAKOTE ER) 500 MG 24 hr tablet Take 1 tablet (500 mg total) by mouth daily. 30 tablet 11   No current facility-administered medications for this visit.    Allergies as of 08/25/2015  . (No Known Allergies)    Vitals: BP 117/71 mmHg  Pulse 79  Ht 6' (1.829 m)  Wt 226 lb 12.8 oz (102.876 kg)  BMI 30.75 kg/m2 Last Weight:  Wt Readings from Last 1 Encounters:  08/25/15 226 lb  12.8 oz (102.876 kg)   Last Height:   Ht Readings from Last 1 Encounters:  08/25/15 6' (1.829 m)   Physical exam: Exam: Gen: NAD, conversant, well nourised, obese, well groomed                     CV: RRR, no MRG. No Carotid Bruits. No peripheral edema, warm, nontender Eyes: Conjunctivae clear without exudates or hemorrhage  Neuro: Detailed Neurologic Exam  Speech:    Speech is normal; fluent and spontaneous with normal comprehension.  Cognition:    The patient is oriented to person, place, and time;     recent and remote memory intact;     language fluent;     normal attention, concentration,     fund of knowledge Cranial Nerves:    The pupils are equal, round, and reactive to light. The fundi are normal and spontaneous venous pulsations are present. Visual fields are full to finger confrontation. Extraocular movements are intact. Trigeminal sensation is intact and the muscles of mastication are normal. The face is symmetric. The palate elevates in the midline. Hearing intact. Voice is normal. Shoulder shrug is normal. The tongue has normal motion without fasciculations.   Coordination:    Normal finger to nose and heel to shin. Normal rapid alternating movements.   Gait:    Heel-toe and tandem gait are normal.   Motor Observation: very mild postural tremor    No asymmetry, no atrophy, and no involuntary movements noted. Tone:    Normal muscle tone.    Posture:    Posture is normal. normal erect    Strength:    Strength is V/V in the upper and lower limbs.      Sensation: intact to LT     Reflex Exam:  DTR's: Absent AJs otherwise deep tendon reflexes in the upper and lower extremities are normal bilaterally.   Toes:    The toes are downgoing bilaterally.   Clonus:    Clonus is absent.       Assessment/Plan:  64 year old male with depression who is here for dizziness, lightheadedness, feeling foggy in the head, chronic intractable headaches. His neurologic  exam is nonfocal.  - Suspect  patient's mood disorder is contributory to his symptoms. We'll start Depakote for headaches which also may help with his mood symptoms as well. Did discuss that Depakote may worsen tremors and can also cause weight gain and other side effects. Discussed side effects and provided an UpToDate patient drug information handout. Side effects of Depakote include drowsiness, weakness, nausea, vomiting, stomach upset, diarrhea, constipation, mood swings, changes in menstrual periods, enlarged breasts, weight changes, agitation, tremor (shaking), vision changes, unusual or unpleasant taste in your mouth, and hair loss. (Patient has had headaches since the 1980s and he was seen at a headache clinic and took medications in the 1980s without benefits. He was also seen by several providers in 2011 here at [facility] and started on multiple medications without benefit for his headaches.)  - For his spells of vertigo, with sinus congestion and sinus problems, drainage, coughing and sneezing and hearing loss, will refer to Dr. Jeneen Rinks crossly who is an ear nose and throat doctor for evaluation.  - For patient's vertigo, change in headache quality, slurred speech we'll order an MRI of the brain. Patient has risk factors for stroke, Need to rule out central cause of vertigo or a vestibular schwannoma, cerebellar infarct and for pathologies affecting the brainstem or vestibular nerve, MRA to detect stenosis or occlusion of the posterior circulation. Can consider vestibular migraine however that is a diagnosis of exclusion.   - For his episodes of altered consciousness and shaking will order an EEG however I think seizures are less likely.   -CMP lab today to check electrolytes esp sodium as well as LFTs before starting depakote  To prevent or relieve headaches, try the following: Cool Compress. Lie down and place a cool compress on your head.  Avoid headache triggers. If  certain foods or odors seem to have triggered your migraines in the past, avoid them. A headache diary might help you identify triggers.  Include physical activity in your daily routine. Try a daily walk or other moderate aerobic exercise.  Manage stress. Find healthy ways to cope with the stressors, such as delegating tasks on your to-do list.  Practice relaxation techniques. Try deep breathing, yoga, massage and visualization.  Eat regularly. Eating regularly scheduled meals and maintaining a healthy diet might help prevent headaches. Also, drink plenty of fluids.  Follow a regular sleep schedule. Sleep deprivation might contribute to headaches Consider biofeedback. With this mind-body technique, you learn to control certain bodily functions - such as muscle tension, heart rate and blood pressure - to prevent headaches or reduce headache pain.    Proceed to emergency room if you experience new or worsening symptoms or symptoms do not resolve, if you have new neurologic symptoms or if headache is severe, or for any concerning symptom.   CC: Willene Hatchet, MD  [facility] [address] [place], [place] [address]  Phone [phone] Fax [phone]

## 2015-08-25 NOTE — Patient Instructions (Signed)
Remember to drink plenty of fluid, eat healthy meals and do not skip any meals. Try to eat protein with a every meal and eat a healthy snack such as fruit or nuts in between meals. Try to keep a regular sleep-wake schedule and try to exercise daily, particularly in the form of walking, 20-30 minutes a day, if you can.   As far as your medications are concerned, I would like to suggest; Depakote '500mg'$  at bedtime  As far as diagnostic testing: MRI of the brain, MRA of the head vessels, EEG, Lab, ENT evaluation  I would like to see you back in 4 months, sooner if we need to. Please call us with any interim questions, concerns, problems, updates or refill requests.   Our phone number is [phone]. We also have an after hours call service for urgent matters and there is a physician on-call for urgent questions. For any emergencies you know to call 911 or go to the nearest emergency room

## 2015-08-26 LAB — COMPREHENSIVE METABOLIC PANEL
ALT: 8 IU/L (ref 0–44)
AST: 14 IU/L (ref 0–40)
Albumin/Globulin Ratio: 1.6 (ref 1.1–2.5)
Albumin: 4 g/dL (ref 3.6–4.8)
Alkaline Phosphatase: 45 IU/L (ref 39–117)
BUN/Creatinine Ratio: 15 (ref 10–22)
BUN: 16 mg/dL (ref 8–27)
Bilirubin Total: 0.3 mg/dL (ref 0.0–1.2)
CALCIUM: 9.3 mg/dL (ref 8.6–10.2)
CO2: 24 mmol/L (ref 18–29)
Chloride: 100 mmol/L (ref 97–106)
Creatinine, Ser: 1.07 mg/dL (ref 0.76–1.27)
GFR, EST AFRICAN AMERICAN: 84 mL/min/{1.73_m2} (ref >59)
GFR, EST NON AFRICAN AMERICAN: 72 mL/min/{1.73_m2} (ref >59)
GLUCOSE: 96 mg/dL (ref 65–99)
Globulin, Total: 2.5 g/dL (ref 1.5–4.5)
Potassium: 4.4 mmol/L (ref 3.5–5.2)
Sodium: 140 mmol/L (ref 136–144)
TOTAL PROTEIN: 6.5 g/dL (ref 6.0–8.5)

## 2015-08-27 ENCOUNTER — Telehealth: Payer: Self-pay | Admitting: *Deleted

## 2015-08-27 NOTE — Telephone Encounter (Signed)
I have spoken with Mitchell Baxter and per Dr. Lavell Anchors, advised that labwork was normal.  He verbalized understanding of same/fim

## 2015-08-27 NOTE — Telephone Encounter (Signed)
-----   Message from Melvenia Beam, MD sent at 08/26/2015  7:34 PM EDT ----- Let patient know lab was normal thanks

## 2015-09-02 ENCOUNTER — Other Ambulatory Visit: Payer: Self-pay | Admitting: Otolaryngology

## 2015-09-02 ENCOUNTER — Ambulatory Visit
Admission: RE | Admit: 2015-09-02 | Discharge: 2015-09-02 | Disposition: A | Discharge status: Home / Self Care | Payer: Medicare Other | Source: Ambulatory Visit | Attending: Otolaryngology | Admitting: Otolaryngology

## 2015-09-02 DIAGNOSIS — J320 Chronic maxillary sinusitis: Secondary | ICD-10-CM | POA: Diagnosis not present

## 2015-09-02 DIAGNOSIS — R0989 Other specified symptoms and signs involving the circulatory and respiratory systems: Secondary | ICD-10-CM

## 2015-09-02 DIAGNOSIS — R0602 Shortness of breath: Secondary | ICD-10-CM | POA: Diagnosis not present

## 2015-09-02 DIAGNOSIS — J370 Chronic laryngitis: Secondary | ICD-10-CM | POA: Diagnosis not present

## 2015-09-02 DIAGNOSIS — R42 Dizziness and giddiness: Secondary | ICD-10-CM | POA: Diagnosis not present

## 2015-09-02 DIAGNOSIS — J322 Chronic ethmoidal sinusitis: Secondary | ICD-10-CM | POA: Diagnosis not present

## 2015-09-02 DIAGNOSIS — J410 Simple chronic bronchitis: Secondary | ICD-10-CM | POA: Diagnosis not present

## 2015-09-02 DIAGNOSIS — R05 Cough: Secondary | ICD-10-CM | POA: Diagnosis not present

## 2015-09-16 ENCOUNTER — Ambulatory Visit
Admission: RE | Admit: 2015-09-16 | Discharge: 2015-09-16 | Disposition: A | Discharge status: Home / Self Care | Payer: Medicare Other | Source: Ambulatory Visit | Attending: Neurology | Admitting: Neurology

## 2015-09-16 DIAGNOSIS — J320 Chronic maxillary sinusitis: Secondary | ICD-10-CM | POA: Diagnosis not present

## 2015-09-16 DIAGNOSIS — H9190 Unspecified hearing loss, unspecified ear: Secondary | ICD-10-CM

## 2015-09-16 DIAGNOSIS — R51 Headache: Secondary | ICD-10-CM | POA: Diagnosis not present

## 2015-09-16 DIAGNOSIS — R519 Headache, unspecified: Secondary | ICD-10-CM

## 2015-09-16 DIAGNOSIS — G450 Vertebro-basilar artery syndrome: Secondary | ICD-10-CM | POA: Diagnosis not present

## 2015-09-16 DIAGNOSIS — H539 Unspecified visual disturbance: Secondary | ICD-10-CM

## 2015-09-16 DIAGNOSIS — R404 Transient alteration of awareness: Secondary | ICD-10-CM

## 2015-09-16 DIAGNOSIS — R251 Tremor, unspecified: Secondary | ICD-10-CM

## 2015-09-16 DIAGNOSIS — R42 Dizziness and giddiness: Secondary | ICD-10-CM

## 2015-09-16 DIAGNOSIS — J328 Other chronic sinusitis: Secondary | ICD-10-CM

## 2015-09-16 DIAGNOSIS — H8143 Vertigo of central origin, bilateral: Secondary | ICD-10-CM | POA: Diagnosis not present

## 2015-09-16 DIAGNOSIS — J322 Chronic ethmoidal sinusitis: Secondary | ICD-10-CM | POA: Diagnosis not present

## 2015-09-16 DIAGNOSIS — H93A9 Pulsatile tinnitus, unspecified ear: Secondary | ICD-10-CM

## 2015-09-16 MED ORDER — GADOBENATE DIMEGLUMINE 529 MG/ML IV SOLN
20.0000 mL | Freq: Once | INTRAVENOUS | Status: AC | PRN
  Administered 2015-09-16: 20 mL via INTRAVENOUS

## 2015-09-20 ENCOUNTER — Telehealth: Payer: Self-pay | Admitting: *Deleted

## 2015-09-20 NOTE — Telephone Encounter (Signed)
-----   Message from Melvenia Beam, MD sent at 09/20/2015 10:52 AM EST ----- Let patient know the MRI is stable from 2010. No changes, no acute findings to explain his new symptoms. thanks

## 2015-09-20 NOTE — Telephone Encounter (Signed)
Called and spoke to pt about MRI stable compared to one in 2010 per Dr Jaynee Eagles. No chances or acute findings to explain new symptoms. He verbalized understanding.

## 2015-09-29 ENCOUNTER — Telehealth: Payer: Self-pay | Admitting: Neurology

## 2015-09-29 NOTE — Telephone Encounter (Signed)
Pt's wife Thayer Headings called sts he is still having bad HA's, tremors and falling to the ground. No fever, no sweats, no chills, she took his pulse which was good. This happened today and one other time since last OV. Please advise as this is not getting better.

## 2015-09-29 NOTE — Telephone Encounter (Signed)
Spoke with wife who states patient's headaches are no better with Depakote. She states he is sleepy, has upset stomach and has to go to bathroom right away following episodes of tremors and near falls. She states he has not fallen but "sits down". Informed her that his symptoms, issues are listed side effects of Depakote, per Dr Cathren Laine OV note. Reviewed Dr Cathren Laine recommendations outlined in [place] note re: HA management.  Informed her that Dr Jaynee Eagles is in office tomorrow and this RN will forward note to Terrence Dupont, RN in the event Dr Jaynee Eagles has further information, instructions. She verbalized understanding, appreciation for call back.

## 2015-09-30 ENCOUNTER — Other Ambulatory Visit: Payer: Self-pay | Admitting: Neurology

## 2015-09-30 MED ORDER — DIVALPROEX SODIUM ER 500 MG PO TB24: 1000.0000 mg | ORAL_TABLET | Freq: Every day | ORAL | Status: DC

## 2015-09-30 NOTE — Telephone Encounter (Signed)
Called pt wife back. Per Dr Jaynee Eagles, ok that pt is scheduled for EEG on 10/04/15. We will call w/ results. Advised Dr Jaynee Eagles called in increased dose of Rx depakote to his pharmacy. Told her to call w/ any further questions. She verbalized understanding.

## 2015-09-30 NOTE — Telephone Encounter (Signed)
Mitchell Baxter, I ordered an EEG for this patient but I don't see it completed yet? If he is having shakes and falling on the ground why hasn't he had his eeg completed? He needs one ASAP please. Also we can increase the Depakote, I will call it into the pharmacy.

## 2015-10-01 DIAGNOSIS — J320 Chronic maxillary sinusitis: Secondary | ICD-10-CM | POA: Diagnosis not present

## 2015-10-04 ENCOUNTER — Ambulatory Visit (INDEPENDENT_AMBULATORY_CARE_PROVIDER_SITE_OTHER): Payer: Medicare Other | Admitting: Neurology

## 2015-10-04 DIAGNOSIS — J328 Other chronic sinusitis: Secondary | ICD-10-CM

## 2015-10-04 DIAGNOSIS — R51 Headache: Secondary | ICD-10-CM

## 2015-10-04 DIAGNOSIS — H539 Unspecified visual disturbance: Secondary | ICD-10-CM

## 2015-10-04 DIAGNOSIS — R251 Tremor, unspecified: Secondary | ICD-10-CM

## 2015-10-04 DIAGNOSIS — R42 Dizziness and giddiness: Secondary | ICD-10-CM

## 2015-10-04 DIAGNOSIS — G450 Vertebro-basilar artery syndrome: Secondary | ICD-10-CM

## 2015-10-04 DIAGNOSIS — R519 Headache, unspecified: Secondary | ICD-10-CM

## 2015-10-04 DIAGNOSIS — R404 Transient alteration of awareness: Secondary | ICD-10-CM

## 2015-10-04 DIAGNOSIS — H9190 Unspecified hearing loss, unspecified ear: Secondary | ICD-10-CM

## 2015-10-04 NOTE — Procedures (Signed)
    History:  Mitchell Baxter is a 65 year old gentleman with a history of an episode of arm shaking and falling over without loss of consciousness that occurred in mid October 2016. The patient has a history of headaches and dizziness. He is being evaluated for the above episodes.  This is a routine EEG. No skull defects are noted. Medications include aspirin, vitamin D, Depakote, Zantac, gabapentin, Antivert, and Diovan.   EEG classification: Normal awake  Description of the recording: The background rhythms of this recording consists of a fairly well modulated medium amplitude alpha rhythm of 11 Hz that is reactive to eye opening and closure. As the record progresses, the patient appears to remain in the waking state throughout the recording. Photic stimulation was performed, resulting in a bilateral and symmetric photic driving response. Hyperventilation was not performed. At no time during the recording does there appear to be evidence of spike or spike wave discharges or evidence of focal slowing. EKG monitor shows no evidence of cardiac rhythm abnormalities with a heart rate of 84.  Impression: This is a normal EEG recording in the waking state. No evidence of ictal or interictal discharges are seen.

## 2015-10-05 ENCOUNTER — Telehealth: Payer: Self-pay | Admitting: *Deleted

## 2015-10-05 ENCOUNTER — Encounter: Payer: Self-pay | Admitting: *Deleted

## 2015-10-05 NOTE — Telephone Encounter (Signed)
-----   Message from Melvenia Beam, MD sent at 10/05/2015 12:54 PM EST ----- Terrence Dupont, please let patient know that EEG was normal. If he is still having episodes of falling to the ground and shaking we can order a 3-day eeg at home to monitor him and hopefully catch an episode. Please ask if he is having any side effects to the Depakote. thanks

## 2015-10-05 NOTE — Telephone Encounter (Signed)
Called pt. Relayed results per Dr Jaynee Eagles. He verbalized understanding and would like to schedule 3-day in home EEG. His last episode of falling to the ground/shaking was this past Sunday morning. Asked about SE from Depakote. He does not think so. He has bad leg pain but can't remember if pain was there before or not. Wanted a couple more days to see if he is having SE. Told him I will send in paperwork to [facility] to get him scheduled for appt. He should receive call to schedule. He verbalized understanding.

## 2015-10-05 NOTE — Progress Notes (Signed)
Faxed request to schedule 72-hr AMB EEG w/ [facility]. Received confirmation.

## 2015-10-12 ENCOUNTER — Encounter: Payer: Self-pay | Admitting: *Deleted

## 2015-10-12 NOTE — Progress Notes (Signed)
Pt scheduled 11/11/14-11/14/14 for 72 hr AMB EEG through [facility].

## 2015-11-03 NOTE — Telephone Encounter (Signed)
LVM for wife to call back. Returning call. Pt scheduled 11/12/15-1/16-17. [facility] will come to pt home to set up test. It is an in home study.

## 2015-11-03 NOTE — Telephone Encounter (Signed)
pts wife called to see when his 3 day EEG appt is. I saw a date but I am not sure where the pt will need to go to have it set up. Can you call and help. Thank you

## 2015-11-04 NOTE — Telephone Encounter (Signed)
LVM on home number returning call from wife. Gave [facility] phone number. Please relay message below if they call back.   Called

## 2015-11-04 NOTE — Telephone Encounter (Signed)
Called Wife back on cell number. She would like her cell number listed as the primary number. Relayed message below. Gave her [facility] number to call to verify appt and to find out when they will come to the house, ect. She is not sure if husband talked to them. Gave her : [phone] ext. [phone]. Told her to call with any further questions. She verbalized understanding.

## 2015-11-12 DIAGNOSIS — R569 Unspecified convulsions: Secondary | ICD-10-CM | POA: Diagnosis not present

## 2015-11-13 DIAGNOSIS — R569 Unspecified convulsions: Secondary | ICD-10-CM | POA: Diagnosis not present

## 2015-11-14 DIAGNOSIS — R569 Unspecified convulsions: Secondary | ICD-10-CM | POA: Diagnosis not present

## 2015-11-22 ENCOUNTER — Telehealth: Payer: Self-pay | Admitting: Neurology

## 2015-11-22 NOTE — Telephone Encounter (Signed)
Pt's wife called inquiring if home EEG results were available. Please call

## 2015-11-22 NOTE — Telephone Encounter (Signed)
EEg was completely normal. Please let them know thanks

## 2015-11-22 NOTE — Telephone Encounter (Signed)
LVM returning call. Results not ready yet. We will call once they are ready. Please let her know. Gave [facility] phone number.

## 2015-11-22 NOTE — Telephone Encounter (Signed)
Pt's wife returned Emma's call. Msg relayed, she understood and said thank you

## 2015-11-23 NOTE — Telephone Encounter (Signed)
Called and spoke to wife about normal EEG per Dr Jaynee Eagles. She verbalized understanding.

## 2015-12-15 DIAGNOSIS — M5442 Lumbago with sciatica, left side: Secondary | ICD-10-CM | POA: Diagnosis not present

## 2015-12-15 DIAGNOSIS — M5431 Sciatica, right side: Secondary | ICD-10-CM | POA: Diagnosis not present

## 2015-12-15 DIAGNOSIS — R55 Syncope and collapse: Secondary | ICD-10-CM | POA: Diagnosis not present

## 2015-12-15 DIAGNOSIS — Z1212 Encounter for screening for malignant neoplasm of rectum: Secondary | ICD-10-CM | POA: Diagnosis not present

## 2015-12-15 DIAGNOSIS — M5432 Sciatica, left side: Secondary | ICD-10-CM | POA: Diagnosis not present

## 2015-12-15 DIAGNOSIS — M47816 Spondylosis without myelopathy or radiculopathy, lumbar region: Secondary | ICD-10-CM | POA: Diagnosis not present

## 2015-12-15 DIAGNOSIS — E559 Vitamin D deficiency, unspecified: Secondary | ICD-10-CM | POA: Diagnosis not present

## 2015-12-15 DIAGNOSIS — L570 Actinic keratosis: Secondary | ICD-10-CM | POA: Diagnosis not present

## 2015-12-15 DIAGNOSIS — Z23 Encounter for immunization: Secondary | ICD-10-CM | POA: Diagnosis not present

## 2015-12-15 DIAGNOSIS — I10 Essential (primary) hypertension: Secondary | ICD-10-CM | POA: Diagnosis not present

## 2015-12-15 DIAGNOSIS — Z125 Encounter for screening for malignant neoplasm of prostate: Secondary | ICD-10-CM | POA: Diagnosis not present

## 2015-12-15 DIAGNOSIS — R5383 Other fatigue: Secondary | ICD-10-CM | POA: Diagnosis not present

## 2015-12-28 DIAGNOSIS — I10 Essential (primary) hypertension: Secondary | ICD-10-CM | POA: Diagnosis not present

## 2015-12-28 DIAGNOSIS — R0602 Shortness of breath: Secondary | ICD-10-CM | POA: Diagnosis not present

## 2015-12-28 DIAGNOSIS — I493 Ventricular premature depolarization: Secondary | ICD-10-CM | POA: Diagnosis not present

## 2015-12-28 DIAGNOSIS — R55 Syncope and collapse: Secondary | ICD-10-CM | POA: Diagnosis not present

## 2016-01-03 DIAGNOSIS — R55 Syncope and collapse: Secondary | ICD-10-CM | POA: Diagnosis not present

## 2016-01-03 DIAGNOSIS — R0602 Shortness of breath: Secondary | ICD-10-CM | POA: Diagnosis not present

## 2016-01-07 ENCOUNTER — Encounter: Payer: Self-pay | Admitting: Neurology

## 2016-01-12 DIAGNOSIS — R0602 Shortness of breath: Secondary | ICD-10-CM | POA: Diagnosis not present

## 2016-01-12 DIAGNOSIS — R55 Syncope and collapse: Secondary | ICD-10-CM | POA: Diagnosis not present

## 2016-01-19 DIAGNOSIS — I10 Essential (primary) hypertension: Secondary | ICD-10-CM | POA: Diagnosis not present

## 2016-01-19 DIAGNOSIS — R0602 Shortness of breath: Secondary | ICD-10-CM | POA: Diagnosis not present

## 2016-01-19 DIAGNOSIS — R55 Syncope and collapse: Secondary | ICD-10-CM | POA: Diagnosis not present

## 2016-01-19 DIAGNOSIS — E7800 Pure hypercholesterolemia, unspecified: Secondary | ICD-10-CM | POA: Diagnosis not present

## 2016-01-26 DIAGNOSIS — R0683 Snoring: Secondary | ICD-10-CM | POA: Diagnosis not present

## 2016-01-26 DIAGNOSIS — R55 Syncope and collapse: Secondary | ICD-10-CM | POA: Diagnosis not present

## 2016-01-26 DIAGNOSIS — R5383 Other fatigue: Secondary | ICD-10-CM | POA: Diagnosis not present

## 2016-01-26 DIAGNOSIS — G8929 Other chronic pain: Secondary | ICD-10-CM | POA: Diagnosis not present

## 2016-01-26 DIAGNOSIS — G4733 Obstructive sleep apnea (adult) (pediatric): Secondary | ICD-10-CM | POA: Diagnosis not present

## 2016-05-16 ENCOUNTER — Other Ambulatory Visit: Payer: Self-pay | Admitting: Registered Nurse

## 2016-05-16 DIAGNOSIS — I781 Nevus, non-neoplastic: Secondary | ICD-10-CM | POA: Diagnosis not present

## 2016-05-16 DIAGNOSIS — L280 Lichen simplex chronicus: Secondary | ICD-10-CM | POA: Diagnosis not present

## 2016-05-16 DIAGNOSIS — L570 Actinic keratosis: Secondary | ICD-10-CM | POA: Diagnosis not present

## 2016-06-13 DIAGNOSIS — Z9989 Dependence on other enabling machines and devices: Secondary | ICD-10-CM | POA: Diagnosis not present

## 2016-06-13 DIAGNOSIS — E7800 Pure hypercholesterolemia, unspecified: Secondary | ICD-10-CM | POA: Diagnosis not present

## 2016-06-13 DIAGNOSIS — I10 Essential (primary) hypertension: Secondary | ICD-10-CM | POA: Diagnosis not present

## 2016-06-13 DIAGNOSIS — F418 Other specified anxiety disorders: Secondary | ICD-10-CM | POA: Diagnosis not present

## 2016-08-11 ENCOUNTER — Telehealth: Payer: Self-pay | Admitting: Neurology

## 2016-08-11 NOTE — Telephone Encounter (Signed)
Yes he needs an appt. He can see megan or carolyn if I am booked thanks

## 2016-08-11 NOTE — Telephone Encounter (Signed)
Pt's wife called to let the office know divalproex (DEPAKOTE ER) 500 MG 24 hr tablet does not seem to be working for headaches anymore. They have been getting worse for the last 2-3 months. Can he be given anything else. Please call [phone]

## 2016-08-11 NOTE — Telephone Encounter (Signed)
Dr Ahern/Megan- FYI  Called wife back. Scheduled F/u per Dr Jaynee Eagles request with MM, NP on 10/16 at 2pm.  Spoke to wife to check in 145pm.

## 2016-08-11 NOTE — Telephone Encounter (Signed)
Dr Jaynee Eagles- patient last seen 08/25/15. Would you like him to come for appt? If so, okay to see NP since you are booked out?

## 2016-08-14 ENCOUNTER — Ambulatory Visit (INDEPENDENT_AMBULATORY_CARE_PROVIDER_SITE_OTHER): Payer: Medicare Other | Admitting: Adult Health

## 2016-08-14 ENCOUNTER — Encounter: Payer: Self-pay | Admitting: Adult Health

## 2016-08-14 VITALS — BP 126/70 | HR 60 | Ht 72.0 in | Wt 237.0 lb

## 2016-08-14 DIAGNOSIS — R51 Headache: Secondary | ICD-10-CM | POA: Diagnosis not present

## 2016-08-14 DIAGNOSIS — R251 Tremor, unspecified: Secondary | ICD-10-CM

## 2016-08-14 DIAGNOSIS — Z5181 Encounter for therapeutic drug level monitoring: Secondary | ICD-10-CM

## 2016-08-14 DIAGNOSIS — R519 Headache, unspecified: Secondary | ICD-10-CM

## 2016-08-14 NOTE — Progress Notes (Signed)
PATIENT: Mitchell Baxter DOB: [birth year]  REASON FOR VISIT: follow up HISTORY FROM: patient  HISTORY OF PRESENT ILLNESS: Mitchell Baxter is a 65 year old male with a history of headaches and tremors. He returns today for follow-up. He states that Depakote was beneficial for his headaches until approximately 1-2 months ago. He states that his headaches have returned. They usually start in the base of the neck and radiate up the back of the head. He denies any injury to the neck. He reports that sometimes he does have photophobia and phonophobia. Denies nausea and vomiting. He states that he  has a headache daily. Some days are worse than other. He is not taking any over-the-counter medication. No pain medicine. He states that his tremors have gotten worse.He has a tremor in both hands. The tremor is not constant. In the past he's had an MRI of the brain that was unremarkable. He also had an EEG and ambulatory EEG that was unremarkable. He returns today for an evaluation.  HISTORY Mitchell Baxter is a 65 y.o. male here as a referral from Dr. Toy Cookey for dizziness, intractable headache. He has a past medical history of chronic headache, migraine, depression, anxiety, high cholesterol, hypertension, vertigo, COPD, sleep apnea.   He has been having spells of vertigo, started 6 months ago. It happens when he is outside. He happened one time driving on turning head, once when standing in line. It lasted 5 minutes, with associated nausea, no vomiting. If he lays still it goes away. He has sinus congestion and drainage and coughing. He has very bad itching in the ears. He has lost hearing in both ears. No ear fullness. Vertigo happens once a week. Getting worse. His head feels funny and cloudy. He has sinus issues, sinus drainage, he is sneezing.  He was outside 2 weeks ago and he bent over, his arm started shaking, he fell over. He has been having bad shakes. His hands shake.  He did not lose consciousness. He  fell over, he was laying on the group, and his arm was shaking very bad. He bent down to look at something and he just kept going, there was no vertigo, landed on the ground, no urination or defecation, he was on the ground for a minute, his head did "not feel right" his brain feels spongy and fuzzy. His head hurts all the time. His voice is slurred recently.  Several years ago he would cough very hard and black out but that has not happened in a long time. He was seen by r Love in the past and was diagnosed with rebound migraines and also has headaches. Her patient Dr. love was not able to find anything to help him with his headaches and these headaches are chronic for years. He takes neurontin for neuropathy in the legs. No recent illnesses, no infection, no fever. His right ear was impacted. He has chronic headaches, daily headaches, the pain starts in the back of the neck, pressure headaches around the head. He used to have migraines and the headaches have transformed into what it is now for the last several years. He wakes up with headaches. No snoring. He likes to sleep all day, wife says there is depression that is not well treated. He is having blurry vision and vision changes.   Patient has had headaches since the 1980s and he was seen at a headache clinic and took medications in the 1980s without benefits. He was also seen by several  providers in 2011 here at [facility] and started on multiple medications without benefit for his headaches.  Reviewed notes, labs and imaging from outside physicians, which showed: Patient referred here by primary care for lightheaded and dizziness over the last month. Patient was in [employer] at 1. felt dizzy and sweaty denied need chest pain. Denied panic attack. Unable to move head without becoming dizzy with some nausea, no vomiting.  Personally reviewed images of MRI of the brain in April 2010 which was normal.  Review previous notes for  Dr. Jeneen Rinks love back in 2011. Patient was seen by Dr. love for headaches initially in June 2011. MRI of the brain and intracranial MRA in 2007 were normal. He was seen for headaches in the posterior head regions mostly on the right and then daily for 6 weeks. Headaches were nonstop. Nothing made them better or worse. No change with bending over cough sneeze or Valsalva. There were no associated visual changes. Dr. love noted a past medical history of migraines. He was started on diclofenac and Topiramate. CBC and CMP were unremarkable, [facility] spotted fever IgG was negative, [facility] spotted fever IgM was negative, ESR was normal, Lyme IgG and IgM were both negative at the time. He was later seen by Dr. Cloretta Ned NP for follow-up in August 2011 in our office. Patient had improvement on the diclofenac and Topamax for 2 weeks then the headaches returned. Topamax was increased in a daily muscle relaxant was provided. At the next follow-up in April 2012 he had tried Imitrex which caused chest pain. He was also taking over-the-counter medication approximately 40 month. Apparently he was started on Depakote at that time but stopped it without explanation. At that time he was suggested to restart Depakote and obtain sleep study and stop over-the-counter pain meds  REVIEW OF SYSTEMS: Out of a complete 14 system review of symptoms, the patient complains only of the following symptoms, and all other reviewed systems are negative.  Chest tightness, restless leg, apnea, acting out dreams, neck pain, neck stiffness, tremors, headache, dizziness, memory loss  ALLERGIES: No Known Allergies  HOME MEDICATIONS: Outpatient Medications Prior to Visit  Medication Sig Dispense Refill  . aspirin 81 MG tablet Take 160 mg by mouth daily.    . divalproex (DEPAKOTE ER) 500 MG 24 hr tablet Take 2 tablets (1,000 mg total) by mouth daily. 60 tablet 11  . FLUoxetine (PROZAC) 40 MG capsule Take 40 mg by mouth every  evening.     . gabapentin (NEURONTIN) 300 MG capsule Take 300 mg by mouth every morning.  6  . Probiotic Product (PROBIOTIC PO) Take 1 tablet by mouth daily.    . valsartan-hydrochlorothiazide (DIOVAN-HCT) 160-12.5 MG tablet Take 0.5 tablets by mouth every morning.   6  . Cholecalciferol (VITAMIN D3) 5000 UNITS TABS Take 1 tablet by mouth daily.    . Cyanocobalamin (VITAMIN B-12 PO) Take 5,000 Units by mouth daily.    . meclizine (ANTIVERT) 25 MG tablet Take 25 mg by mouth as needed.  0   No facility-administered medications prior to visit.     PAST MEDICAL HISTORY: Past Medical History:  Diagnosis Date  . COPD (chronic obstructive pulmonary disease) ([place])   . Depression   . GERD (gastroesophageal reflux disease)   . Headache(784.0)    migraine  . Hyperlipidemia   . Hypertension   . Shortness of breath    exertion  . Sleep apnea    stopbang=4  . Vertigo  PAST SURGICAL HISTORY: Past Surgical History:  Procedure Laterality Date  . BRAVO [place] STUDY  11/29/2011   Procedure: BRAVO [place];  Surgeon: Landry Dyke, MD;  Location: [facility];  Service: Endoscopy;  Laterality: N/A;  . COLONOSCOPY  10/03/2012   Procedure: COLONOSCOPY;  Surgeon: Shann Medal, MD;  Location: [facility];  Service: General;  Laterality: N/A;  carol/leone  . HIATAL HERNIA REPAIR  04/16/2012   Procedure: LAPAROSCOPIC REPAIR OF HIATAL HERNIA;  Surgeon: Pedro Earls, MD;  Location: [facility];  Service: General;;  . LAPAROSCOPIC NISSEN FUNDOPLICATION  6/00/4599   Procedure: LAPAROSCOPIC NISSEN FUNDOPLICATION;  Surgeon: Pedro Earls, MD;  Location: [facility];  Service: General;  Laterality: N/A;  . none      FAMILY HISTORY: Family History  Problem Relation Age of Onset  . Anesthesia problems Neg Hx   . Hypotension Neg Hx   . Malignant hyperthermia Neg Hx   . Pseudochol deficiency Neg Hx   . Migraines Neg Hx   . Colon cancer Father     pancreatic    SOCIAL HISTORY: Social History    Social History  . Marital status: Married    Spouse name: Jan  . Number of children: 2  . Years of education: 12   Occupational History  . Self-employed    Social History Main Topics  . Smoking status: Current Every Day Smoker    Packs/day: 1.00    Years: 40.00    Types: Cigarettes  . Smokeless tobacco: Never Used  . Alcohol use Yes     Comment: occasional  . Drug use: No  . Sexual activity: Not on file   Other Topics Concern  . Not on file   Social History Narrative   Lives at home with his wife.   Caffeine use: 2-3 cups daily of coffee/soda      PHYSICAL EXAM  Vitals:   08/14/16 1346  BP: 126/70  Pulse: 60  Weight: 237 lb (107.5 kg)  Height: 6' (1.829 m)   Body mass index is 32.14 kg/m.  Generalized: Well developed, in no acute distress, occasional intentional shaking in both legs   Neurological examination  Mentation: Alert oriented to time, place, history taking. Follows all commands speech and language fluent Cranial nerve II-XII: Pupils were equal round reactive to light. Extraocular movements were full, visual field were full on confrontational test. Facial sensation and strength were normal. Uvula tongue midline. Head turning and shoulder shrug  were normal and symmetric. Motor: The motor testing reveals 5 over 5 strength of all 4 extremities. Good symmetric motor tone is noted throughout.  Sensory: Sensory testing is intact to soft touch on all 4 extremities. No evidence of extinction is noted.  Coordination: Cerebellar testing reveals good finger-nose-finger and heel-to-shin bilaterally.  Gait and station: Gait is normal. Tandem gait is normal. Romberg is negative. No drift is seen. Mild intention tremor in both hands. Reflexes: Deep tendon reflexes are symmetric and normal bilaterally.   DIAGNOSTIC DATA (LABS, IMAGING, TESTING) - I reviewed patient records, labs, notes, testing and imaging myself where available.  Lab Results  Component Value  Date   WBC 11.1 (H) 04/17/2012   HGB 14.6 04/17/2012   HCT 43.7 04/17/2012   MCV 94.6 04/17/2012   PLT 219 04/17/2012      Component Value Date/Time   NA 140 08/25/2015 1425   K 4.4 08/25/2015 1425   CL 100 08/25/2015 1425   CO2 24 08/25/2015 1425   GLUCOSE  96 08/25/2015 1425   GLUCOSE 115 (H) 04/09/2012 1500   BUN 16 08/25/2015 1425   CREATININE 1.07 08/25/2015 1425   CALCIUM 9.3 08/25/2015 1425   PROT 6.5 08/25/2015 1425   ALBUMIN 4.0 08/25/2015 1425   AST 14 08/25/2015 1425   ALT 8 08/25/2015 1425   ALKPHOS 45 08/25/2015 1425   BILITOT 0.3 08/25/2015 1425   GFRNONAA 72 08/25/2015 1425   GFRAA 84 08/25/2015 1425      ASSESSMENT AND PLAN 65 y.o. year old male  has a past medical history of COPD (chronic obstructive pulmonary disease) ([place]); Depression; GERD (gastroesophageal reflux disease); Headache(784.0); Hyperlipidemia; Hypertension; Shortness of breath; Sleep apnea; and Vertigo. here with:  1. Headaches 2. Tremor  The patient had good relief from Depakote initially however it is no longer beneficial at this time. I will check a Depakote level. If his level is in normal range we will consider increasing Depakote. The patient continues to have a tremor however his workup has been fairly unremarkable. Patient advised that if his symptoms worsen or he develops any new symptoms he should let us know. Follow-up in 3-4 months with Dr. Henri Medal, MSN, NP-C 08/14/2016, 2:18 PM [facility] [address] [place], [place] [address] [phone]

## 2016-08-14 NOTE — Patient Instructions (Signed)
Blood work today  Pending blood work Depakote can be increased If your symptoms worsen or you develop new symptoms please let us know.

## 2016-08-15 ENCOUNTER — Telehealth: Payer: Self-pay | Admitting: Adult Health

## 2016-08-15 LAB — COMPREHENSIVE METABOLIC PANEL
ALBUMIN: 3.7 g/dL (ref 3.6–4.8)
ALK PHOS: 42 IU/L (ref 39–117)
ALT: 13 IU/L (ref 0–44)
AST: 12 IU/L (ref 0–40)
Albumin/Globulin Ratio: 1.4 (ref 1.2–2.2)
BILIRUBIN TOTAL: 0.4 mg/dL (ref 0.0–1.2)
BUN/Creatinine Ratio: 11 (ref 10–24)
BUN: 12 mg/dL (ref 8–27)
CO2: 24 mmol/L (ref 18–29)
CREATININE: 1.13 mg/dL (ref 0.76–1.27)
Calcium: 8.9 mg/dL (ref 8.6–10.2)
Chloride: 102 mmol/L (ref 96–106)
GFR, EST AFRICAN AMERICAN: 78 mL/min/{1.73_m2} (ref >59)
GFR, EST NON AFRICAN AMERICAN: 67 mL/min/{1.73_m2} (ref >59)
GLUCOSE: 80 mg/dL (ref 65–99)
Globulin, Total: 2.6 g/dL (ref 1.5–4.5)
POTASSIUM: 4.8 mmol/L (ref 3.5–5.2)
Sodium: 141 mmol/L (ref 134–144)
Total Protein: 6.3 g/dL (ref 6.0–8.5)

## 2016-08-15 LAB — CBC WITH DIFFERENTIAL/PLATELET
BASOS: 0 %
Basophils Absolute: 0 10*3/uL (ref 0.0–0.2)
EOS (ABSOLUTE): 0.1 10*3/uL (ref 0.0–0.4)
EOS: 1 %
HEMATOCRIT: 48.1 % (ref 37.5–51.0)
HEMOGLOBIN: 17.1 g/dL (ref 12.6–17.7)
IMMATURE GRANS (ABS): 0 10*3/uL (ref 0.0–0.1)
Immature Granulocytes: 0 %
LYMPHS: 33 %
Lymphocytes Absolute: 3.3 10*3/uL — ABNORMAL HIGH (ref 0.7–3.1)
MCH: 33.5 pg — AB (ref 26.6–33.0)
MCHC: 35.6 g/dL (ref 31.5–35.7)
MCV: 94 fL (ref 79–97)
Monocytes Absolute: 1.2 10*3/uL — ABNORMAL HIGH (ref 0.1–0.9)
Monocytes: 12 %
NEUTROS ABS: 5.2 10*3/uL (ref 1.4–7.0)
Neutrophils: 54 %
PLATELETS: 201 10*3/uL (ref 150–379)
RBC: 5.1 x10E6/uL (ref 4.14–5.80)
RDW: 14.1 % (ref 12.3–15.4)
WBC: 9.8 10*3/uL (ref 3.4–10.8)

## 2016-08-15 LAB — VALPROIC ACID LEVEL: Valproic Acid Lvl: 35 ug/mL — ABNORMAL LOW (ref 50–100)

## 2016-08-15 MED ORDER — DIVALPROEX SODIUM ER 500 MG PO TB24: 1500.0000 mg | ORAL_TABLET | Freq: Every day | ORAL | 11 refills | Status: DC

## 2016-08-15 NOTE — Telephone Encounter (Signed)
LMVM for pt to return call for lab results.  

## 2016-08-15 NOTE — Telephone Encounter (Addendum)
Please inform patient that his lab work is unremarkable. His Depakote level was low. We will increase his Depakote to 1500 mg daily (3 tablets). He is currently taking 1000 mg daily. If the patient's headaches do not improve he should let us know.

## 2016-08-16 NOTE — Telephone Encounter (Signed)
I had not heard back from pt.  I called and spoke to his wife, Jan.  I relayed that lab work is ok with exception of depakote level is low.  Will increase to '1500mg'$  daily (he is taking '500mg'$  tablets).  Last note states taking '1000mg'$  daily.  Wife understood.  She will let him know.  If he has questions will call back.

## 2016-08-18 NOTE — Progress Notes (Signed)
I have reviewed and agreed above plan. 

## 2016-08-29 ENCOUNTER — Ambulatory Visit (INDEPENDENT_AMBULATORY_CARE_PROVIDER_SITE_OTHER): Payer: Medicare Other | Admitting: Family

## 2016-08-29 ENCOUNTER — Encounter (INDEPENDENT_AMBULATORY_CARE_PROVIDER_SITE_OTHER): Payer: Self-pay | Admitting: Family

## 2016-08-29 ENCOUNTER — Ambulatory Visit (INDEPENDENT_AMBULATORY_CARE_PROVIDER_SITE_OTHER): Payer: Medicare Other

## 2016-08-29 VITALS — Ht 73.0 in | Wt 225.0 lb

## 2016-08-29 DIAGNOSIS — M79672 Pain in left foot: Secondary | ICD-10-CM

## 2016-08-29 DIAGNOSIS — M722 Plantar fascial fibromatosis: Secondary | ICD-10-CM | POA: Diagnosis not present

## 2016-08-29 MED ORDER — METHYLPREDNISOLONE ACETATE 40 MG/ML IJ SUSP
40.0000 mg | INTRAMUSCULAR | Status: AC | PRN
  Administered 2016-08-29: 40 mg

## 2016-08-29 MED ORDER — LIDOCAINE HCL 1 % IJ SOLN
2.0000 mL | INTRAMUSCULAR | Status: AC | PRN
  Administered 2016-08-29: 2 mL

## 2016-08-29 NOTE — Progress Notes (Signed)
Office Visit Note   Patient: Mitchell Baxter           Date of Birth: 12-24-1950           MRN: [id number] Visit Date: 08/29/2016              Requested by: Delia Chimes, NP [address], [place] [address] PCP: Delia Chimes, NP   Assessment & Plan: Visit Diagnoses:  1. Pain of left heel     Plan: Follow Up in the office in 4 weeks. Have discussed getting sole orthotics. Recommended he begin heelcord stretching daily.  Follow-Up Instructions: Return in about 4 weeks (around 09/26/2016).   Orders:  Orders Placed This Encounter  Procedures  . Small Joint Injection/Arthrocentesis  . Foot Injection  . XR Foot 2 Views Left   No orders of the defined types were placed in this encounter.     Procedures: Plantar Fasciitis Date/Time: 08/29/2016 11:16 AM Performed by: Dondra Prader RENEE Authorized by: Dondra Prader RENEE   Consent Given by:  Patient Site marked: the procedure site was marked   Timeout: prior to procedure the correct patient, procedure, and site was verified   Indications:  Fasciitis and pain Condition: Plantar Fasciitis   Location: left plantar fascia muscle   Prep: patient was prepped and draped in usual sterile fashion   Needle Size:  22 G Medications:  2 mL lidocaine 1 %; 40 mg methylPREDNISolone acetate 40 MG/ML Patient Tolerance:  Patient tolerated the procedure well with no immediate complications     Clinical Data: No additional findings.   Subjective: Chief Complaint  Patient presents with  . Left Foot - Pain    Patient presents today with persistent left foot pain. He has been told several years ago that he has bone spurs at his heel. He states the pain flares up every now and than but it is not going away. He is not currently taking anything for pain. Pain localized bottom of heel, and radiates bilaterally. He has very minimal swelling.    Review of Systems  Constitutional: Negative for chills and fever.  All other  systems reviewed and are negative.    Objective: Vital Signs: Ht '6\' 1"'$  (1.854 m)   Wt 225 lb (102.1 kg)   BMI 29.69 kg/m   Physical Exam  Ortho Exam  Patient is alert and oriented normal affect normal respiratory effort. Does have an antalgic gait. Left foot is plantigrade. Does have point tenderness to the origin of plantar fascia. Heelcord tightness with dorsiflexion to 90. Palpable 2+ DP pulse.  Specialty Comments:  No specialty comments available.  Imaging: Xr Foot 2 Views Left  Result Date: 08/29/2016 2 views of the left heel are negative for fracture. Does have heel spurs.    PMFS History: Patient Active Problem List   Diagnosis Date Noted  . Altered mental status 08/25/2015  . Tremor of right hand 08/25/2015  . Vision changes 08/25/2015  . Worsening headaches 08/25/2015  . New onset of headaches after age 58 08/25/2015  . Sinusitis, chronic 08/25/2015  . Hearing loss 08/25/2015  . Vertigo 08/25/2015  . Lap Nissen June 2013 05/24/2012  . GERD (gastroesophageal reflux disease) 03/28/2012  . Chronic headaches 12/22/2011  . HTN (hypertension) 12/22/2011  . High cholesterol 12/22/2011  . Depression 12/22/2011   Past Medical History:  Diagnosis Date  . COPD (chronic obstructive pulmonary disease) ([place])   . Depression   . GERD (gastroesophageal reflux disease)   . Headache(784.0)  migraine  . Hyperlipidemia   . Hypertension   . Shortness of breath    exertion  . Sleep apnea    stopbang=4  . Vertigo     Family History  Problem Relation Age of Onset  . Colon cancer Father     pancreatic  . Anesthesia problems Neg Hx   . Hypotension Neg Hx   . Malignant hyperthermia Neg Hx   . Pseudochol deficiency Neg Hx   . Migraines Neg Hx     Past Surgical History:  Procedure Laterality Date  . BRAVO [place] STUDY  11/29/2011   Procedure: BRAVO [place];  Surgeon: Landry Dyke, MD;  [facility];  Service: Endoscopy;  Laterality: N/A;  . COLONOSCOPY   10/03/2012   Procedure: COLONOSCOPY;  Surgeon: Shann Medal, MD;  Location: [facility];  Service: General;  Laterality: N/A;  carol/leone  . HIATAL HERNIA REPAIR  04/16/2012   Procedure: LAPAROSCOPIC REPAIR OF HIATAL HERNIA;  Surgeon: Pedro Earls, MD;  [facility];  Service: General;;  . LAPAROSCOPIC NISSEN FUNDOPLICATION  01/16/2333   Procedure: LAPAROSCOPIC NISSEN FUNDOPLICATION;  Surgeon: Pedro Earls, MD;  [facility];  Service: General;  Laterality: N/A;  . none     Social History   Occupational History  . Self-employed    Social History Main Topics  . Smoking status: Current Every Day Smoker    Packs/day: 1.00    Years: 40.00    Types: Cigarettes  . Smokeless tobacco: Never Used  . Alcohol use Yes     Comment: occasional  . Drug use: No  . Sexual activity: Not on file

## 2016-09-26 ENCOUNTER — Ambulatory Visit (INDEPENDENT_AMBULATORY_CARE_PROVIDER_SITE_OTHER): Payer: Medicare Other | Admitting: Orthopedic Surgery

## 2016-09-26 DIAGNOSIS — M722 Plantar fascial fibromatosis: Secondary | ICD-10-CM

## 2016-09-26 NOTE — Progress Notes (Signed)
Office Visit Note   Patient: Mitchell Baxter           Date of Birth: December 18, 1950           MRN: [id number] Visit Date: 09/26/2016              Requested by: Delia Chimes, NP [address], [place] [address] PCP: Delia Chimes, NP   Assessment & Plan: Visit Diagnoses: No diagnosis found.  Plan: Will take Aleve twice daily for 2 weeks. May roll foot over ice bottle. Encouraged him to work on heel cord stretching. We will follow up in 2 more weeks, if no better will repeat steroid injection.  Follow-Up Instructions: No Follow-up on file.   Orders:  No orders of the defined types were placed in this encounter.  No orders of the defined types were placed in this encounter.     Procedures: No procedures performed   Clinical Data: No additional findings.   Subjective: Chief Complaint  Patient presents with  . Left Foot - Follow-up    Patient is a 65 year old gentleman seen in follow-up for plantar fasciitis on the left. Had a cortisone injection at last visit. States his feeling better. Pain improved but still present. It is unsure whether he wants another injection today. Not been taking any NSAIDs for pain. Has been stretching 1or twice a day.  Does also have new orthotics.  Review of Systems  Constitutional: Negative for chills and fever.  Musculoskeletal: Positive for myalgias.     Objective: Vital Signs: There were no vitals taken for this visit.  Physical Exam  Constitutional: He is oriented to person, place, and time. He appears well-developed and well-nourished.  Pulmonary/Chest: Effort normal.  Neurological: He is alert and oriented to person, place, and time.  Psychiatric: He has a normal mood and affect.  Nursing note reviewed.  Continues to have mild tenderness to origin of plantar fascia. Heel cord tightness. Does have dorsiflexion just past neutral. Ortho Exam  Specialty Comments:  No specialty comments available.  Imaging: No  results found.   PMFS History: Patient Active Problem List   Diagnosis Date Noted  . Altered mental status 08/25/2015  . Tremor of right hand 08/25/2015  . Vision changes 08/25/2015  . Worsening headaches 08/25/2015  . New onset of headaches after age 77 08/25/2015  . Sinusitis, chronic 08/25/2015  . Hearing loss 08/25/2015  . Vertigo 08/25/2015  . Lap Nissen June 2013 05/24/2012  . GERD (gastroesophageal reflux disease) 03/28/2012  . Chronic headaches 12/22/2011  . HTN (hypertension) 12/22/2011  . High cholesterol 12/22/2011  . Depression 12/22/2011   Past Medical History:  Diagnosis Date  . COPD (chronic obstructive pulmonary disease) ([place])   . Depression   . GERD (gastroesophageal reflux disease)   . Headache(784.0)    migraine  . Hyperlipidemia   . Hypertension   . Shortness of breath    exertion  . Sleep apnea    stopbang=4  . Vertigo     Family History  Problem Relation Age of Onset  . Colon cancer Father     pancreatic  . Anesthesia problems Neg Hx   . Hypotension Neg Hx   . Malignant hyperthermia Neg Hx   . Pseudochol deficiency Neg Hx   . Migraines Neg Hx     Past Surgical History:  Procedure Laterality Date  . BRAVO [place] STUDY  11/29/2011   Procedure: BRAVO [place];  Surgeon: Landry Dyke, MD;  [facility];  Service: Endoscopy;  Laterality: N/A;  . COLONOSCOPY  10/03/2012   Procedure: COLONOSCOPY;  Surgeon: Shann Medal, MD;  Location: [facility];  Service: General;  Laterality: N/A;  carol/leone  . HIATAL HERNIA REPAIR  04/16/2012   Procedure: LAPAROSCOPIC REPAIR OF HIATAL HERNIA;  Surgeon: Pedro Earls, MD;  [facility];  Service: General;;  . LAPAROSCOPIC NISSEN FUNDOPLICATION  12/25/7503   Procedure: LAPAROSCOPIC NISSEN FUNDOPLICATION;  Surgeon: Pedro Earls, MD;  [facility];  Service: General;  Laterality: N/A;  . none     Social History   Occupational History  . Self-employed    Social History Main Topics    . Smoking status: Current Every Day Smoker    Packs/day: 1.00    Years: 40.00    Types: Cigarettes  . Smokeless tobacco: Never Used  . Alcohol use Yes     Comment: occasional  . Drug use: No  . Sexual activity: Not on file

## 2016-10-18 ENCOUNTER — Ambulatory Visit (INDEPENDENT_AMBULATORY_CARE_PROVIDER_SITE_OTHER): Payer: Medicare Other | Admitting: Orthopedic Surgery

## 2016-11-14 ENCOUNTER — Telehealth: Payer: Self-pay | Admitting: *Deleted

## 2016-11-14 ENCOUNTER — Ambulatory Visit: Payer: Medicare Other | Admitting: Neurology

## 2016-11-14 NOTE — Telephone Encounter (Signed)
no showed f/u 

## 2016-11-21 ENCOUNTER — Encounter: Payer: Self-pay | Admitting: Neurology

## 2016-11-30 ENCOUNTER — Encounter (INDEPENDENT_AMBULATORY_CARE_PROVIDER_SITE_OTHER): Payer: Self-pay

## 2016-11-30 ENCOUNTER — Ambulatory Visit (INDEPENDENT_AMBULATORY_CARE_PROVIDER_SITE_OTHER): Payer: Medicare Other

## 2016-11-30 ENCOUNTER — Encounter (INDEPENDENT_AMBULATORY_CARE_PROVIDER_SITE_OTHER): Payer: Self-pay | Admitting: Orthopedic Surgery

## 2016-11-30 ENCOUNTER — Ambulatory Visit (INDEPENDENT_AMBULATORY_CARE_PROVIDER_SITE_OTHER): Payer: Medicare Other | Admitting: Family

## 2016-11-30 DIAGNOSIS — M25572 Pain in left ankle and joints of left foot: Secondary | ICD-10-CM

## 2016-11-30 DIAGNOSIS — M7661 Achilles tendinitis, right leg: Secondary | ICD-10-CM

## 2016-11-30 MED ORDER — LIDOCAINE HCL 1 % IJ SOLN
2.0000 mL | INTRAMUSCULAR | Status: AC | PRN
  Administered 2016-11-30: 2 mL

## 2016-11-30 MED ORDER — METHYLPREDNISOLONE ACETATE 40 MG/ML IJ SUSP
40.0000 mg | INTRAMUSCULAR | Status: AC | PRN
  Administered 2016-11-30: 40 mg via INTRA_ARTICULAR

## 2016-11-30 NOTE — Progress Notes (Signed)
Office Visit Note   Patient: Mitchell Baxter           Date of Birth: [birth year]           MRN: [id number] Visit Date: 11/30/2016              Requested by: Delia Chimes, NP [place] [place], [place] [address] PCP: Delia Chimes, NP  Chief Complaint  Patient presents with  . Right Foot - Pain  . Left Foot - Pain    HPI: Patient presents today with right ankle and left foot pain. Pain in right ankle approximately 2-3 weeks without known injury. Pain medial right ankle radiating posteriorly and up his right lower leg.   Patient has pain across dorsum of foot. He complains of swelling. He is status post plantar fascia injection 08/29/16. Which did provide relief but states this is a different type of pain and states the dorsal foot pain has been ongoing for a week. Maxcine Ham, RT  The patient is a 66 year old gentleman who is seen today for 3 separate issues. He is seen in follow-up for plantar fasciitis on the left. This has resolved. Is also now complaining of left anterior ankle pain as well as dorsal foot pain. His is made worse by ambulation. Complains of moderate swelling. Has been taking Aleve without relief. No known injury. No history of fracture injury. Him right heel pain. Points to the Achilles is the most painful area there is moderate swelling. This is made worse by ambulation has a hard time walking due to pain. No relief from aleve.  Assessment & Plan: Visit Diagnoses: No diagnosis found.  Plan: We'll provide him with a Cam Walker for the right lower extremity. He will use a heel lift in the Cam Walker for ambulation. Continue with Aleve 2 tablets twice daily. Will also provided prescription for nitroglycerin patches. Injection for the left ankle. Follow-up in office in 2 weeks.  Follow-Up Instructions: No Follow-up on file.   Exam: Physical Exam  Constitutional: Appears well-developed.  Head: Normocephalic.  Eyes: EOM are normal.  Neck: Normal range  of motion.  Cardiovascular: Normal rate.   Pulmonary/Chest: Effort normal.  Neurological: Is alert.  Skin: Skin is warm.  Psychiatric: Has a normal mood and affect.  Right Ankle Exam  Swelling: mild  Tenderness  Right ankle tenderness location: achilles insertion     Range of Motion  Right ankle dorsiflexion: to neutral.   Muscle Strength  The patient has normal right ankle strength.   Left Ankle Exam  Swelling: mild  Tenderness  Left ankle tenderness location: sinus tarsi.   Muscle Strength  The patient has normal left ankle strength.  Tests  Anterior drawer: negative Varus tilt: positive       Imaging: No results found.  Orders:  No orders of the defined types were placed in this encounter.  No orders of the defined types were placed in this encounter.    Procedures: Medium Joint Inj Date/Time: 11/30/2016 9:46 AM Performed by: Dondra Prader R Authorized by: Dondra Prader R   Consent Given by:  Patient Site marked: the procedure site was marked   Timeout: prior to procedure the correct patient, procedure, and site was verified   Indications:  Pain and diagnostic evaluation Location:  Ankle Site:  L ankle Prep: patient was prepped and draped in usual sterile fashion   Needle Size:  22 G Needle Length:  1.5 inches Ultrasound Guided: No   Fluoroscopic Guidance:  No   Medications:  2 mL lidocaine 1 %; 40 mg methylPREDNISolone acetate 40 MG/ML Aspiration Attempted: No   Patient tolerance:  Patient tolerated the procedure well with no immediate complications    Clinical Data: No additional findings.  Subjective: Review of Systems  Constitutional: Negative for chills and fever.  Musculoskeletal: Positive for arthralgias, gait problem and joint swelling.    Objective: Vital Signs: There were no vitals taken for this visit.  Specialty Comments:  No specialty comments available.  PMFS History: Patient Active Problem List   Diagnosis Date  Noted  . Plantar fasciitis 09/26/2016  . Altered mental status 08/25/2015  . Tremor of right hand 08/25/2015  . Vision changes 08/25/2015  . Worsening headaches 08/25/2015  . New onset of headaches after age 17 08/25/2015  . Sinusitis, chronic 08/25/2015  . Hearing loss 08/25/2015  . Vertigo 08/25/2015  . Lap Nissen June 2013 05/24/2012  . GERD (gastroesophageal reflux disease) 03/28/2012  . Chronic headaches 12/22/2011  . HTN (hypertension) 12/22/2011  . High cholesterol 12/22/2011  . Depression 12/22/2011   Past Medical History:  Diagnosis Date  . COPD (chronic obstructive pulmonary disease) ([place])   . Depression   . GERD (gastroesophageal reflux disease)   . Headache(784.0)    migraine  . Hyperlipidemia   . Hypertension   . Shortness of breath    exertion  . Sleep apnea    stopbang=4  . Vertigo     Family History  Problem Relation Age of Onset  . Colon cancer Father     pancreatic  . Anesthesia problems Neg Hx   . Hypotension Neg Hx   . Malignant hyperthermia Neg Hx   . Pseudochol deficiency Neg Hx   . Migraines Neg Hx     Past Surgical History:  Procedure Laterality Date  . BRAVO [place] STUDY  11/29/2011   Procedure: BRAVO [place];  Surgeon: Landry Dyke, MD;  Location: [facility];  Service: Endoscopy;  Laterality: N/A;  . COLONOSCOPY  10/03/2012   Procedure: COLONOSCOPY;  Surgeon: Shann Medal, MD;  Location: [facility];  Service: General;  Laterality: N/A;  carol/leone  . HIATAL HERNIA REPAIR  04/16/2012   Procedure: LAPAROSCOPIC REPAIR OF HIATAL HERNIA;  Surgeon: Pedro Earls, MD;  Location: [facility];  Service: General;;  . LAPAROSCOPIC NISSEN FUNDOPLICATION  3/91/2258   Procedure: LAPAROSCOPIC NISSEN FUNDOPLICATION;  Surgeon: Pedro Earls, MD;  Location: [facility];  Service: General;  Laterality: N/A;  . none     Social History   Occupational History  . Self-employed    Social History Main Topics  . Smoking status: Current Every Day Smoker     Packs/day: 1.00    Years: 40.00    Types: Cigarettes  . Smokeless tobacco: Never Used  . Alcohol use Yes     Comment: occasional  . Drug use: No  . Sexual activity: Not on file

## 2016-12-13 ENCOUNTER — Ambulatory Visit (INDEPENDENT_AMBULATORY_CARE_PROVIDER_SITE_OTHER): Payer: Medicare Other | Admitting: Family

## 2016-12-13 VITALS — Ht 73.0 in | Wt 225.0 lb

## 2016-12-13 DIAGNOSIS — M7661 Achilles tendinitis, right leg: Secondary | ICD-10-CM

## 2016-12-13 DIAGNOSIS — M25572 Pain in left ankle and joints of left foot: Secondary | ICD-10-CM

## 2016-12-13 NOTE — Progress Notes (Signed)
Office Visit Note   Patient: Mitchell Baxter           Date of Birth: June 17, 1951           MRN: [id number] Visit Date: 12/13/2016              Requested by: Delia Chimes, NP [place], [place] [address] PCP: Delia Chimes, NP  Chief Complaint  Patient presents with  . Right Ankle - Follow-up  . Left Ankle - Pain    HPI: The patient is a 66 year old gentleman who presents today in follow-up for Achilles tendinitis on the right as well as ankle pain on the left. He is 2 weeks out from a steroid injection for the left ankle. States pain is resolved. The tendinitis on the right has resolved as well. Does continue to have a little bit of pain with palpation. None with ambulation. Been in a fracture boot with a heel lift for the last 2 weeks.    Assessment & Plan: Visit Diagnoses:  1. Achilles tendinitis, right leg   2. Pain in left ankle and joints of left foot     Plan: Discontinue the fracture boot and heel lift. He will begin working on heel cord stretching on the right. May use Aleve or ibuprofen as needed for pain and inflammation. We'll follow-up in office in 4 weeks for continued pain.  Follow-Up Instructions: Return in about 4 weeks (around 01/10/2017), or if symptoms worsen or fail to improve.   Physical Exam  Constitutional: Appears well-developed.  Head: Normocephalic.  Eyes: EOM are normal.  Neck: Normal range of motion.  Cardiovascular: Normal rate.   Pulmonary/Chest: Effort normal.  Neurological: Is alert.  Skin: Skin is warm.  Psychiatric: Has a normal mood and affect.  Right Ankle Exam   Tenderness  Right ankle tenderness location: The Achilles is nontender. Does have some posterior ankle minimal tenderness.    Range of Motion  Right ankle dorsiflexion: To neutral.   Tests  Varus tilt: negative  Other  Erythema: absent    Left Ankle Exam  Left ankle exam is normal. Swelling: none  Tenderness  The patient is experiencing no  tenderness.   Muscle Strength  The patient has normal left ankle strength.  Other  Erythema: absent       Imaging: No results found.  Orders:  No orders of the defined types were placed in this encounter.  No orders of the defined types were placed in this encounter.    Procedures: No procedures performed  Clinical Data: No additional findings.  Subjective: Review of Systems  Constitutional: Negative for chills and fever.  Cardiovascular: Negative for leg swelling.  Musculoskeletal: Negative for arthralgias, gait problem and joint swelling.    Objective: Vital Signs: Ht '6\' 1"'$  (1.854 m)   Wt 225 lb (102.1 kg)   BMI 29.69 kg/m   Specialty Comments:  No specialty comments available.  PMFS History: Patient Active Problem List   Diagnosis Date Noted  . Plantar fasciitis 09/26/2016  . Altered mental status 08/25/2015  . Tremor of right hand 08/25/2015  . Vision changes 08/25/2015  . Worsening headaches 08/25/2015  . New onset of headaches after age 66 08/25/2015  . Sinusitis, chronic 08/25/2015  . Hearing loss 08/25/2015  . Vertigo 08/25/2015  . Lap Nissen June 2013 05/24/2012  . GERD (gastroesophageal reflux disease) 03/28/2012  . Chronic headaches 12/22/2011  . HTN (hypertension) 12/22/2011  . High cholesterol 12/22/2011  . Depression  12/22/2011   Past Medical History:  Diagnosis Date  . COPD (chronic obstructive pulmonary disease) ([place])   . Depression   . GERD (gastroesophageal reflux disease)   . Headache(784.0)    migraine  . Hyperlipidemia   . Hypertension   . Shortness of breath    exertion  . Sleep apnea    stopbang=4  . Vertigo     Family History  Problem Relation Age of Onset  . Colon cancer Father     pancreatic  . Anesthesia problems Neg Hx   . Hypotension Neg Hx   . Malignant hyperthermia Neg Hx   . Pseudochol deficiency Neg Hx   . Migraines Neg Hx     Past Surgical History:  Procedure Laterality Date  . BRAVO [place] STUDY   11/29/2011   Procedure: BRAVO [place];  Surgeon: Landry Dyke, MD;  Location: [facility];  Service: Endoscopy;  Laterality: N/A;  . COLONOSCOPY  10/03/2012   Procedure: COLONOSCOPY;  Surgeon: Shann Medal, MD;  Location: [facility];  Service: General;  Laterality: N/A;  carol/leone  . HIATAL HERNIA REPAIR  04/16/2012   Procedure: LAPAROSCOPIC REPAIR OF HIATAL HERNIA;  Surgeon: Pedro Earls, MD;  [facility];  Service: General;;  . LAPAROSCOPIC NISSEN FUNDOPLICATION  04/20/6332   Procedure: LAPAROSCOPIC NISSEN FUNDOPLICATION;  Surgeon: Pedro Earls, MD;  Location: [facility];  Service: General;  Laterality: N/A;  . none     Social History   Occupational History  . Self-employed    Social History Main Topics  . Smoking status: Current Every Day Smoker    Packs/day: 1.00    Years: 40.00    Types: Cigarettes  . Smokeless tobacco: Never Used  . Alcohol use Yes     Comment: occasional  . Drug use: No  . Sexual activity: Not on file

## 2017-02-07 ENCOUNTER — Encounter (INDEPENDENT_AMBULATORY_CARE_PROVIDER_SITE_OTHER): Payer: Self-pay | Admitting: Family

## 2017-02-07 ENCOUNTER — Ambulatory Visit (INDEPENDENT_AMBULATORY_CARE_PROVIDER_SITE_OTHER): Payer: Medicare Other | Admitting: Family

## 2017-02-07 VITALS — Ht 73.0 in | Wt 225.0 lb

## 2017-02-07 DIAGNOSIS — M7661 Achilles tendinitis, right leg: Secondary | ICD-10-CM | POA: Diagnosis not present

## 2017-02-07 DIAGNOSIS — M7662 Achilles tendinitis, left leg: Secondary | ICD-10-CM | POA: Diagnosis not present

## 2017-02-07 DIAGNOSIS — L84 Corns and callosities: Secondary | ICD-10-CM

## 2017-02-07 MED ORDER — NITROGLYCERIN 0.2 MG/HR TD PT24: 0.2000 mg | MEDICATED_PATCH | Freq: Every day | TRANSDERMAL | 12 refills | Status: DC

## 2017-02-07 NOTE — Progress Notes (Signed)
Office Visit Note   Patient: Mitchell Baxter           Date of Birth: 1951/02/09           MRN: [id number] Visit Date: 02/07/2017              Requested by: No referring provider defined for this encounter. PCP: Delia Chimes, NP (Inactive)  Chief Complaint  Patient presents with  . Left Foot - Pain  . Right Foot - Pain      HPI: The patient is a 66 year old gentleman who presents today for evaluation of bilateral heel pain. This is been ongoing for many months. At last visit he was not having any pain in his left heel. Today has had worsening of bilateral heel pain. He is in hard orthotics bilaterally for arch support. He complains of plantar heel pain that is well localized on the left as well as pain to the global ankles bilaterally with ambulation. States this is worse over the course of the day. Has pain with dorsiflexion.  Assessment & Plan: Visit Diagnoses:  1. Achilles tendinitis, left leg   2. Achilles tendinitis, right leg   3. Corn of foot     Plan: Have provided a prescription for nitroglycerin patches which she will apply to the bilateral Achilles daily. Have provided him with heel lifts that he will wear in his shoe wear bilaterally as well. Advised to use I provided or Aleve twice daily for an anti-inflammatories. Did pare a corn on the left heel as well. Patient voiced immediately relief.  Follow-Up Instructions: Return in about 4 weeks (around 03/07/2017).   Right Ankle Exam  Right ankle exam is normal.  Tests  Varus tilt: negative    Left Ankle Exam  Left ankle exam is normal.  Tests  Varus tilt: negative      Patient is alert, oriented, no adenopathy, well-dressed, normal affect, normal respiratory effort. Insertions of bilateral Achilles are exquisitely tender to palpation there is some local swelling. There are no palpable defects. No cords. Heel cord tightness bilaterally with dorsiflexion just shy of neutral. Does have tenderness to the left  heel plantar aspect. There is a corn this is where he is tender. This was pared with a 10 blade knife.  Imaging: No results found.  Labs: No results found for: HGBA1C, ESRSEDRATE, CRP, LABURIC, REPTSTATUS, GRAMSTAIN, CULT, LABORGA  Orders:  No orders of the defined types were placed in this encounter.  Meds ordered this encounter  Medications  . nitroGLYCERIN (NITRODUR - DOSED IN MG/24 HR) 0.2 mg/hr patch    Sig: Place 1 patch (0.2 mg total) onto the skin daily.    Dispense:  60 patch    Refill:  12    Needs 2 boxes at time, 60 patches per month     Procedures: No procedures performed  Clinical Data: No additional findings.  ROS: Review of Systems  Constitutional: Negative for chills and fever.  Cardiovascular: Negative for leg swelling.  Musculoskeletal: Positive for myalgias.  Skin: Negative for wound.    Objective: Vital Signs: Ht '6\' 1"'$  (1.854 m)   Wt 225 lb (102.1 kg)   BMI 29.69 kg/m   Specialty Comments:  No specialty comments available.  PMFS History: Patient Active Problem List   Diagnosis Date Noted  . Plantar fasciitis 09/26/2016  . Altered mental status 08/25/2015  . Tremor of right hand 08/25/2015  . Vision changes 08/25/2015  . Worsening headaches 08/25/2015  . New onset  of headaches after age 36 08/25/2015  . Sinusitis, chronic 08/25/2015  . Hearing loss 08/25/2015  . Vertigo 08/25/2015  . Lap Nissen June 2013 05/24/2012  . GERD (gastroesophageal reflux disease) 03/28/2012  . Chronic headaches 12/22/2011  . HTN (hypertension) 12/22/2011  . High cholesterol 12/22/2011  . Depression 12/22/2011   Past Medical History:  Diagnosis Date  . COPD (chronic obstructive pulmonary disease) ([place])   . Depression   . GERD (gastroesophageal reflux disease)   . Headache(784.0)    migraine  . Hyperlipidemia   . Hypertension   . Shortness of breath    exertion  . Sleep apnea    stopbang=4  . Vertigo     Family History  Problem Relation Age of  Onset  . Colon cancer Father     pancreatic  . Anesthesia problems Neg Hx   . Hypotension Neg Hx   . Malignant hyperthermia Neg Hx   . Pseudochol deficiency Neg Hx   . Migraines Neg Hx     Past Surgical History:  Procedure Laterality Date  . BRAVO [place] STUDY  11/29/2011   Procedure: BRAVO [place];  Surgeon: Landry Dyke, MD;  [facility];  Service: Endoscopy;  Laterality: N/A;  . COLONOSCOPY  10/03/2012   Procedure: COLONOSCOPY;  Surgeon: Shann Medal, MD;  Location: [facility];  Service: General;  Laterality: N/A;  carol/leone  . HIATAL HERNIA REPAIR  04/16/2012   Procedure: LAPAROSCOPIC REPAIR OF HIATAL HERNIA;  Surgeon: Pedro Earls, MD;  [facility];  Service: General;;  . LAPAROSCOPIC NISSEN FUNDOPLICATION  3/79/4446   Procedure: LAPAROSCOPIC NISSEN FUNDOPLICATION;  Surgeon: Pedro Earls, MD;  Location: [facility];  Service: General;  Laterality: N/A;  . none     Social History   Occupational History  . Self-employed    Social History Main Topics  . Smoking status: Current Every Day Smoker    Packs/day: 1.00    Years: 40.00    Types: Cigarettes  . Smokeless tobacco: Never Used  . Alcohol use Yes     Comment: occasional  . Drug use: No  . Sexual activity: Not on file

## 2017-05-16 ENCOUNTER — Telehealth: Payer: Self-pay | Admitting: Neurology

## 2017-05-16 NOTE — Telephone Encounter (Signed)
Advise him that swelling in the legs is usually not primarily neurologic and we usually worry about cardiac or other problems first. I do think they need to see their pcp. But I am also happy to see them, can schedule in my first available appt. thanks

## 2017-05-16 NOTE — Telephone Encounter (Signed)
Pt was last seen by [facility] in Oct 2017 for HAs and tremor. Depakote level was low and dosage was increased. He no-showed his appt in January. Was last seen by ortho in April for achilles tendinitis.

## 2017-05-16 NOTE — Telephone Encounter (Signed)
Patient's wife calling stating patient has swelling in ankles and legs x 1 month. He has problems walking. She called PCP and was advised to come to neurologist. Also the orthopedist has done all they can do. Please call and discuss.

## 2017-05-22 NOTE — Telephone Encounter (Signed)
LMVM for pts wife and relayed the message re: to swelling not primarily neuro, could be cardiac or other problems.  Rec see pcp, but can see them for appt with Dr. Jaynee Eagles.  Pt or wife to return call if needed.

## 2017-06-04 ENCOUNTER — Telehealth: Payer: Self-pay | Admitting: Neurology

## 2017-06-04 ENCOUNTER — Ambulatory Visit (INDEPENDENT_AMBULATORY_CARE_PROVIDER_SITE_OTHER): Payer: Medicare Other | Admitting: Neurology

## 2017-06-04 ENCOUNTER — Encounter: Payer: Self-pay | Admitting: Neurology

## 2017-06-04 VITALS — BP 129/81 | HR 83 | Ht 72.0 in | Wt 245.6 lb

## 2017-06-04 DIAGNOSIS — G43711 Chronic migraine without aura, intractable, with status migrainosus: Secondary | ICD-10-CM | POA: Diagnosis not present

## 2017-06-04 DIAGNOSIS — R519 Headache, unspecified: Secondary | ICD-10-CM

## 2017-06-04 DIAGNOSIS — M542 Cervicalgia: Secondary | ICD-10-CM

## 2017-06-04 DIAGNOSIS — R51 Headache: Secondary | ICD-10-CM

## 2017-06-04 NOTE — Progress Notes (Signed)
[facility]    Provider:  Dr Jaynee Eagles  CC:  Dizziness, vertigo and intractable headache.  Interval history June 04 2017: This is a 66 year old male with decades of headaches since the 1980s and originally saw Dr. Erling Cruz here in our practice, failed multiple medications, is seen multiple neurologists and headache clinics. He is here for follow-up on headache. He has a past medical history of chronic headache, migraine, depression, anxiety, high cholesterol, hypertension, vertigo, COPD, sleep apnea. There has been a component of medication overuse in the past but non in the last 6 months. Patient was also seen for tremor and reported falls, routine eeg and 3 day EEG were both normal. He is on depakote. No OTC using his cpap. Here for neck pain in the right side of the neck, stays on the right occipital areas sometimes it radiates to the neck and to the top of the head. He has sensitivity.It hurts all the time. No decreased ROM of the neck. Daily headaches. Constant 24 hours a day.  They are throbbing, associated nausea, vomiting, photophobia and phonophobia. No aura. Patient has daily migraines that are continuous and last up to 24 hours.No other focal neurologic deficits, associated symptoms, inciting events or modifiable factors.  Tried: gabapentin, depakote, prozac, Indomethacin, reglan, propranolol, He doesn't remember all themedications he has tried in the last 40 years but he has tried them all.    HPI:  Mitchell Baxter is a 66 y.o. male here as a referral from Dr. Toy Cookey for dizziness, intractable headache. He has a past medical history of chronic headache, migraine, depression, anxiety, high cholesterol, hypertension, vertigo, COPD, sleep apnea.   He has been having spells of vertigo, started 6 months ago. It happens when he is outside. He happened one time driving on turning head, once when standing in line. It lasted 5 minutes, with associated nausea, no vomiting. If he lays  still it goes away. He has sinus congestion and drainage and coughing. He has very bad itching in the ears. He has lost hearing in both ears. No ear fullness. Vertigo happens once a week. Getting worse. His head feels funny and cloudy. He has sinus issues, sinus drainage, he is sneezing.  He was outside 2 weeks ago and he bent over, his arm started shaking, he fell over. He has been having bad shakes. His hands shake.  He did not lose consciousness. He fell over, he was laying on the group, and his arm was shaking very bad. He bent down to look at something and he just kept going, there was no vertigo, landed on the ground, no urination or defecation, he was on the ground for a minute, his head did "not feel right" his brain feels spongy and fuzzy. His head hurts all the time. His voice is slurred recently.  Several years ago he would cough very hard and black out but that has not happened in a long time. He was seen by r Love in the past and was diagnosed with rebound migraines and also has headaches. Her patient Dr. love was not able to find anything to help him with his headaches and these headaches are chronic for years. He takes neurontin for neuropathy in the legs. No recent illnesses, no infection, no fever. His right ear was impacted. He has chronic headaches, daily headaches, the pain starts in the back of the neck, pressure headaches around the head. He used to have migraines and the headaches have transformed into what it is  now for the last several years. He wakes up with headaches. No snoring. He likes to sleep all day, wife says there is depression that is not well treated. He is having blurry vision and vision changes.   Patient has had headaches since the 1980s and he was seen at a headache clinic and took medications in the 1980s without benefits. He was also seen by several providers in 2011 here at [facility] and started on multiple medications without benefit for his  headaches.  Reviewed notes, labs and imaging from outside physicians, which showed: Patient referred here by primary care for lightheaded and dizziness over the last month. Patient was in [employer] at 1. felt dizzy and sweaty denied need chest pain. Denied panic attack. Unable to move head without becoming dizzy with some nausea, no vomiting.  Personally reviewed images of MRI of the brain in April 2010 which was normal.  Review previous notes for Dr. Jeneen Rinks love back in 2011. Patient was seen by Dr. love for headaches initially in June 2011. MRI of the brain and intracranial MRA in 2007 were normal. He was seen for headaches in the posterior head regions mostly on the right and then daily for 6 weeks. Headaches were nonstop. Nothing made them better or worse. No change with bending over cough sneeze or Valsalva. There were no associated visual changes. Dr. love noted a past medical history of migraines. He was started on diclofenac and Topiramate. CBC and CMP were unremarkable, [facility] spotted fever IgG was negative, [facility] spotted fever IgM was negative, ESR was normal, Lyme IgG and IgM were both negative at the time. He was later seen by Dr. Cloretta Ned NP for follow-up in August 2011 in our office. Patient had improvement on the diclofenac and Topamax for 2 weeks then the headaches returned. Topamax was increased in a daily muscle relaxant was provided. At the next follow-up in April 2012 he had tried Imitrex which caused chest pain. He was also taking over-the-counter medication approximately 40 month. Apparently he was started on Depakote at that time but stopped it without explanation. At that time he was suggested to restart Depakote and obtain sleep study and stop over-the-counter pain meds.   Review of Systems: Patient complains of symptoms per HPI as well as the following symptoms: No chest pain, no shortness of breath, no fevers, no meningismus. Pertinent negatives and  positives per HPI. All others negative.   Social History   Social History  . Marital status: Married    Spouse name: Jan  . Number of children: 2  . Years of education: 12   Occupational History  . Self-employed    Social History Main Topics  . Smoking status: Current Every Day Smoker    Packs/day: 1.00    Years: 40.00    Types: Cigarettes  . Smokeless tobacco: Never Used  . Alcohol use Yes     Comment: occasional  . Drug use: No  . Sexual activity: Not on file   Other Topics Concern  . Not on file   Social History Narrative   Lives at home with his wife.   Caffeine use: 2-3 cups daily of coffee/soda    Family History  Problem Relation Age of Onset  . Colon cancer Father        pancreatic  . Anesthesia problems Neg Hx   . Hypotension Neg Hx   . Malignant hyperthermia Neg Hx   . Pseudochol deficiency Neg Hx   . Migraines  Neg Hx     Past Medical History:  Diagnosis Date  . COPD (chronic obstructive pulmonary disease) ([place])   . Depression   . GERD (gastroesophageal reflux disease)   . Headache(784.0)    migraine  . Hyperlipidemia   . Hypertension   . Shortness of breath    exertion  . Sleep apnea    stopbang=4  . Vertigo     Past Surgical History:  Procedure Laterality Date  . BRAVO [place] STUDY  11/29/2011   Procedure: BRAVO [place];  Surgeon: Landry Dyke, MD;  Location: [facility];  Service: Endoscopy;  Laterality: N/A;  . COLONOSCOPY  10/03/2012   Procedure: COLONOSCOPY;  Surgeon: Shann Medal, MD;  Location: [facility];  Service: General;  Laterality: N/A;  carol/leone  . HIATAL HERNIA REPAIR  04/16/2012   Procedure: LAPAROSCOPIC REPAIR OF HIATAL HERNIA;  Surgeon: Pedro Earls, MD;  Location: [facility];  Service: General;;  . LAPAROSCOPIC NISSEN FUNDOPLICATION  03/16/6159   Procedure: LAPAROSCOPIC NISSEN FUNDOPLICATION;  Surgeon: Pedro Earls, MD;  Location: [facility];  Service: General;  Laterality: N/A;  . none      Current Outpatient  Prescriptions  Medication Sig Dispense Refill  . aspirin 81 MG tablet Take 160 mg by mouth daily.    Marland Kitchen atorvastatin (LIPITOR) 40 MG tablet Take 40 mg by mouth daily.    . divalproex (DEPAKOTE ER) 500 MG 24 hr tablet Take 3 tablets (1,500 mg total) by mouth daily. 90 tablet 11  . FLUoxetine (PROZAC) 40 MG capsule Take 40 mg by mouth every evening.     . gabapentin (NEURONTIN) 300 MG capsule Take 300 mg by mouth every morning.  6  . nitroGLYCERIN (NITRODUR - DOSED IN MG/24 HR) 0.2 mg/hr patch Place 1 patch (0.2 mg total) onto the skin daily. (Patient taking differently: Place 0.2 mg onto the skin daily as needed. ) 60 patch 12  . Probiotic Product (PROBIOTIC PO) Take 1 tablet by mouth daily.    . valsartan-hydrochlorothiazide (DIOVAN-HCT) 160-12.5 MG tablet Take 0.5 tablets by mouth every morning.   6   No current facility-administered medications for this visit.     Allergies as of 06/04/2017  . (No Known Allergies)    Vitals: BP 129/81   Pulse 83   Ht 6' (1.829 m)   Wt 245 lb 9.6 oz (111.4 kg)   BMI 33.31 kg/m  Last Weight:  Wt Readings from Last 1 Encounters:  06/04/17 245 lb 9.6 oz (111.4 kg)   Last Height:   Ht Readings from Last 1 Encounters:  06/04/17 6' (1.829 m)    Physical exam: Exam: Gen: NAD, conversant, well nourised, obese, well groomed                     Eyes: Conjunctivae clear without exudates or hemorrhage  Neuro: Detailed Neurologic Exam  Speech:    Speech is normal; fluent and spontaneous with normal comprehension.  Cognition:    The patient is oriented to person, place, and time;      Cranial Nerves:    The pupils are equal, round, and reactive to light. The fundi are normal and spontaneous venous pulsations are present. Visual fields are full to finger confrontation. Extraocular movements are intact. Trigeminal sensation is intact and the muscles of mastication are normal. The face is symmetric. The palate elevates in the midline. Hearing intact.  Voice is normal. Shoulder shrug is normal. The tongue has normal motion without fasciculations.  Motor Observation:    No asymmetry, no atrophy, and no involuntary movements noted. Tone:    Normal muscle tone.    Posture:    Posture is normal. normal erect    Strength:    Strength is V/V in the upper and lower limbs.          Assessment/Plan:  This is a 66 year old male with decades of headaches since the 1980s and originally saw Dr. Erling Cruz here in our practice, failed multiple medications, is seen multiple neurologists and headache clinics. Diagnosis is chronic intractable migraines without aura and with status migrainosus. I had a long talk with patient and his wife about his multiple failed medications and continued headache and migraines. He has tried physical therapy as well for musculoskeletal neck pain. At this time he would be an excellent Botox candidate for migraines. We'll place for referral.  Sarina Ill, MD  [facility] [address] [place], [place] [address]  Phone [phone] Fax [phone]  A total of 25 minutes was spent face-to-face with this patient. Over half this time was spent on counseling patient on the intractable chronic migraines diagnosis and different diagnostic and therapeutic options available.

## 2017-06-04 NOTE — Patient Instructions (Addendum)
Depakote: Decrease by one pill every 3-4 days and then stop Topiramate: 25mg  an evening and then increase by 1 pill every 3-4 days until on 100mg  at night Botox for migraines Physical Therapy  Occipital Neuralgia Occipital neuralgia is a type of headache that causes episodes of very bad pain in the back of your head. Pain from occipital neuralgia may spread (radiate) to other parts of your head. The pain is usually brief and often goes away after you rest and relax. These headaches may be caused by irritation of the nerves that leave your spinal cord high up in your neck, just below the base of your skull (occipital nerves). Your occipital nerves transmit sensations from the back of your head, the top of your head, and the areas behind your ears. What are the causes? Occipital neuralgia can occur without any known cause (primary headache syndrome). In other cases, occipital neuralgia is caused by pressure on or irritation of one of the two occipital nerves. Causes of occipital nerve compression or irritation include:  Wear and tear of the vertebrae in the neck (osteoarthritis).  Neck injury.  Disease of the disks that separate the vertebrae.  Tumors.  Gout.  Infections.  Diabetes.  Swollen blood vessels that put pressure on the occipital nerves.  Muscle spasm in the neck.  What are the signs or symptoms? Pain is the main symptom of occipital neuralgia. It usually starts in the back of the head but may also be felt in other areas supplied by the occipital nerves. Pain is usually on one side but may be on both sides. You may have:  Brief episodes of very bad pain that is burning, stabbing, shocking, or shooting.  Pain behind the eye.  Pain triggered by neck movement or hair brushing.  Scalp tenderness.  Aching in the back of the head between episodes of very bad pain.  How is this diagnosed? Your health care provider may diagnose occipital neuralgia based on your symptoms and  a physical exam. During the exam, the health care provider may push on areas supplied by the occipital nerves to see if they are painful. Some tests may also be done to help in making the diagnosis. These may include:  Imaging studies of the upper spinal cord, such as an MRI or CT scan. These may show compression or spinal cord abnormalities.  Nerve block. You will get an injection of numbing medicine (local anesthetic) near the occipital nerve to see if this relieves pain.  How is this treated? Treatment may begin with simple measures, such as:  Rest.  Massage.  Heat.  Over-the-counter pain relievers.  If these measures do not work, you may need other treatments, including:  Medicines such as: ? Prescription-strength anti-inflammatory medicines. ? Muscle relaxants. ? Antiseizure medicines. ? Antidepressants.  Steroid injection. This involves injections of local anesthetic and strong anti-inflammatory drugs (steroids).  Pulsed radiofrequency. Wires are implanted to deliver electrical impulses that block pain signals from the occipital nerve.  Physical therapy.  Surgery to relieve nerve pressure.  Follow these instructions at home:  Take all medicines as directed by your health care provider.  Avoid activities that cause pain.  Rest when you have an attack of pain.  Try gentle massage or a heating pad to relieve pain.  Work with a physical therapist to learn stretching exercises you can do at home.  Try a different pillow or sleeping position.  Practice good posture.  Try to stay active. Get regular exercise that does  not cause pain. Ask your health care provider to suggest safe exercises for you.  Keep all follow-up visits as directed by your health care provider. This is important. Contact a health care provider if:  Your medicine is not working.  You have new or worsening symptoms. Get help right away if:  You have very bad head pain that is not going  away.  You have a sudden change in vision, balance, or speech. This information is not intended to replace advice given to you by your health care provider. Make sure you discuss any questions you have with your health care provider. Document Released: 10/10/2001 Document Revised: 03/23/2016 Document Reviewed: 10/08/2013 Elsevier Interactive Patient Education  2017 [place].   Topiramate extended-release capsules What is this medicine? TOPIRAMATE (toe PYRE a mate) is used to treat seizures in adults or children with epilepsy. It is also used for the prevention of migraine headaches. This medicine may be used for other purposes; ask your health care provider or pharmacist if you have questions. COMMON BRAND NAME(S): Trokendi XR What should I tell my health care provider before I take this medicine? They need to know if you have any of these conditions: -cirrhosis of the liver or liver disease -diarrhea -glaucoma -kidney stones or kidney disease -lung disease like asthma, obstructive pulmonary disease, emphysema -metabolic acidosis -on a ketogenic diet -scheduled for surgery or a procedure -suicidal thoughts, plans, or attempt; a previous suicide attempt by you or a family member -an unusual or allergic reaction to topiramate, other medicines, foods, dyes, or preservatives -pregnant or trying to get pregnant -breast-feeding How should I use this medicine? Take this medicine by mouth with a glass of water. Follow the directions on the prescription label. Trokendi XR capsules must be swallowed whole. Do not sprinkle on food, break, crush, dissolve, or chew. Qudexy XR capsules may be swallowed whole or opened and sprinkled on a small amount of soft food. This mixture must be swallowed immediately. Do not chew or store mixture for later use. You may take this medicine with meals. Take your medicine at regular intervals. Do not take it more often than directed. Talk to your pediatrician  regarding the use of this medicine in children. Special care may be needed. While Trokendi XR may be prescribed for children as young as 6 years and Qudexy XR may be prescribed for children as young as 2 years for selected conditions, precautions do apply. Overdosage: If you think you have taken too much of this medicine contact a poison control center or emergency room at once. NOTE: This medicine is only for you. Do not share this medicine with others. What if I miss a dose? If you miss a dose, take it as soon as you can. If it is almost time for your next dose, take only that dose. Do not take double or extra doses. What may interact with this medicine? Do not take this medicine with any of the following medications: -probenecid This medicine may also interact with the following medications: -acetazolamide -alcohol -amitriptyline -birth control pills -digoxin -hydrochlorothiazide -lithium -medicines for pain, sleep, or muscle relaxation -metformin -methazolamide -other seizure or epilepsy medicines -pioglitazone -risperidone This list may not describe all possible interactions. Give your health care provider a list of all the medicines, herbs, non-prescription drugs, or dietary supplements you use. Also tell them if you smoke, drink alcohol, or use illegal drugs. Some items may interact with your medicine. What should I watch for while using this medicine?  Visit your doctor or health care professional for regular checks on your progress. Do not stop taking this medicine suddenly. This increases the risk of seizures if you are using this medicine to control epilepsy. Wear a medical identification bracelet or chain to say you have epilepsy or seizures, and carry a card that lists all your medicines. This medicine can decrease sweating and increase your body temperature. Watch for signs of deceased sweating or fever, especially in children. Avoid extreme heat, hot baths, and saunas. Be  careful about exercising, especially in hot weather. Contact your health care provider right away if you notice a fever or decrease in sweating. You should drink plenty of fluids while taking this medicine. If you have had kidney stones in the past, this will help to reduce your chances of forming kidney stones. If you have stomach pain, with nausea or vomiting and yellowing of your eyes or skin, call your doctor immediately. You may get drowsy, dizzy, or have blurred vision. Do not drive, use machinery, or do anything that needs mental alertness until you know how this medicine affects you. To reduce dizziness, do not sit or stand up quickly, especially if you are an older patient. Alcohol can increase drowsiness and dizziness. Avoid alcoholic drinks. Do not drink alcohol for 6 hours before or 6 hours after taking Trokendi XR. If you notice blurred vision, eye pain, or other eye problems, seek medical attention at once for an eye exam. The use of this medicine may increase the chance of suicidal thoughts or actions. Pay special attention to how you are responding while on this medicine. Any worsening of mood, or thoughts of suicide or dying should be reported to your health care professional right away. This medicine may increase the chance of developing metabolic acidosis. If left untreated, this can cause kidney stones, bone disease, or slowed growth in children. Symptoms include breathing fast, fatigue, loss of appetite, irregular heartbeat, or loss of consciousness. Call your doctor immediately if you experience any of these side effects. Also, tell your doctor about any surgery you plan on having while taking this medicine since this may increase your risk for metabolic acidosis. Birth control pills may not work properly while you are taking this medicine. Talk to your doctor about using an extra method of birth control. Women who become pregnant while using this medicine may enroll in the [place] Pregnancy Registry by calling [phone]. This registry collects information about the safety of antiepileptic drug use during pregnancy. What side effects may I notice from receiving this medicine? Side effects that you should report to your doctor or health care professional as soon as possible: -allergic reactions like skin rash, itching or hives, swelling of the face, lips, or tongue -decreased sweating and/or rise in body temperature -depression -difficulty breathing, fast or irregular breathing patterns -difficulty speaking -difficulty walking or controlling muscle movements -hearing impairment -redness, blistering, peeling or loosening of the skin, including inside the mouth -tingling, pain or numbness in the hands or feet -unusually weak or tired -worsening of mood, thoughts or actions of suicide or dying Side effects that usually do not require medical attention (report to your doctor or health care professional if they continue or are bothersome): -altered taste -back pain, joint or muscle aches and pains -diarrhea, or constipation -headache -loss of appetite -nausea -stomach upset, indigestion -tremors This list may not describe all possible side effects. Call your doctor for medical advice about side effects. You may report  side effects to FDA at 1-800-FDA-1088. Where should I keep my medicine? Keep out of the reach of children. Store at room temperature between 15 and 30 degrees C (59 and 86 degrees F) in a tightly closed container. Protect from moisture. Throw away any unused medicine after the expiration date. NOTE: This sheet is a summary. It may not cover all possible information. If you have questions about this medicine, talk to your doctor, pharmacist, or health care provider.  2018 Elsevier/Gold Standard (2016-02-04 12:33:11)

## 2017-06-04 NOTE — Telephone Encounter (Signed)
Pt needs botox

## 2017-06-06 ENCOUNTER — Telehealth: Payer: Self-pay | Admitting: Neurology

## 2017-06-06 DIAGNOSIS — G43711 Chronic migraine without aura, intractable, with status migrainosus: Secondary | ICD-10-CM | POA: Insufficient documentation

## 2017-06-06 NOTE — Telephone Encounter (Signed)
Mitchell Baxter, patient would like to go forward with Botox for migraines. G4 3.711. Please fill out a Botox request form.  Danielle: Can you call and discuss with patient the process please  thanks

## 2017-06-07 NOTE — Telephone Encounter (Signed)
I called patient to schedule injection but he was driving down the highway and requested to call me back.

## 2017-06-07 NOTE — Telephone Encounter (Signed)
I called the patient and spoke with him regarding scheduling the botox injections. He requested to call me back because he was driving down the highway.

## 2017-06-12 NOTE — Telephone Encounter (Signed)
Noted, thank you so much Enid Derry!

## 2017-06-12 NOTE — Telephone Encounter (Signed)
I scheduled patient for Botox on 07-09-17 @ 4:00 with Dr. Jaynee Eagles.

## 2017-07-09 ENCOUNTER — Ambulatory Visit (INDEPENDENT_AMBULATORY_CARE_PROVIDER_SITE_OTHER): Payer: Medicare Other | Admitting: Neurology

## 2017-07-09 ENCOUNTER — Encounter (INDEPENDENT_AMBULATORY_CARE_PROVIDER_SITE_OTHER): Payer: Self-pay

## 2017-07-09 ENCOUNTER — Telehealth: Payer: Self-pay | Admitting: Neurology

## 2017-07-09 VITALS — BP 140/87 | HR 64 | Wt 232.2 lb

## 2017-07-09 DIAGNOSIS — G43711 Chronic migraine without aura, intractable, with status migrainosus: Secondary | ICD-10-CM

## 2017-07-09 NOTE — Telephone Encounter (Signed)
Per Dr. Jaynee Eagles, pt needs botox in 12 weeks.

## 2017-07-09 NOTE — Progress Notes (Signed)
Consent Form Botulism Toxin Injection For Chronic Migraine  Botulism toxin has been approved by the Federal drug administration for treatment of chronic migraine. Botulism toxin does not cure chronic migraine and it may not be effective in some patients.  The administration of botulism toxin is accomplished by injecting a small amount of toxin into the muscles of the neck and head. Dosage must be titrated for each individual. Any benefits resulting from botulism toxin tend to wear off after 3 months with a repeat injection required if benefit is to be maintained. Injections are usually done every 3-4 months with maximum effect peak achieved by about 2 or 3 weeks. Botulism toxin is expensive and you should be sure of what costs you will incur resulting from the injection.  The side effects of botulism toxin use for chronic migraine may include:   -Transient, and usually mild, facial weakness with facial injections  -Transient, and usually mild, head or neck weakness with head/neck injections  -Reduction or loss of forehead facial animation due to forehead muscle              weakness  -Eyelid drooping  -Dry eye  -Pain at the site of injection or bruising at the site of injection  -Double vision  -Potential unknown long term risks  Contraindications: You should not have Botox if you are pregnant, nursing, allergic to albumin, have an infection, skin condition, or muscle weakness at the site of the injection, or have myasthenia gravis, Lambert-Eaton syndrome, or ALS.  It is also possible that as with any injection, there may be an allergic reaction or no effect from the medication. Reduced effectiveness after repeated injections is sometimes seen and rarely infection at the injection site may occur. All care will be taken to prevent these side effects. If therapy is given over a long time, atrophy and wasting in the muscle injected may occur. Occasionally the patient's become refractory to  treatment because they develop antibodies to the toxin. In this event, therapy needs to be modified.  I have read the above information and consent to the administration of botulism toxin.    ______________  _____   _________________  Patient signature     Date   Witness signature       BOTOX PROCEDURE NOTE FOR MIGRAINE HEADACHE    Contraindications and precautions discussed with patient(above). Aseptic procedure was observed and patient tolerated procedure. Procedure performed by Dr. Georgia Dom  The condition has existed for more than 6 months, and pt does not have a diagnosis of ALS, Myasthenia Gravis or Lambert-Eaton Syndrome. Risks and benefits of injections discussed and pt agrees to proceed with the procedure. Written consent obtained  These injections are medically necessary.. These injections do not cause sedations or hallucinations which the oral therapies may cause.  Description of procedure:  The patient was placed in a sitting position. The standard protocol was used for Botox as follows, with 5 units of Botox injected at each site:   -Procerus muscle, midline injection  -Corrugator muscle, bilateral injection  -Frontalis muscle, bilateral injection, with 2 sites each side, medial injection was performed in the upper one third of the frontalis muscle, in the region vertical from the medial inferior edge of the superior orbital rim. The lateral injection was again in the upper one third of the forehead vertically above the lateral limbus of the cornea, 1.5 cm lateral to the medial injection site.  -Temporalis muscle injection, 4 sites, bilaterally. The first injection was  3 cm above the tragus of the ear, second injection site was 1.5 cm to 3 cm up from the first injection site in line with the tragus of the ear. The third injection site was 1.5-3 cm forward between the first 2 injection sites. The fourth injection site was 1.5 cm posterior to the second injection  site.  -Occipitalis muscle injection, 3 sites, bilaterally. The first injection was done one half way between the occipital protuberance and the tip of the mastoid process behind the ear. The second injection site was done lateral and superior to the first, 1 fingerbreadth from the first injection. The third injection site was 1 fingerbreadth superiorly and medially from the first injection site.  -Cervical paraspinal muscle injection, 2 sites, bilateral knee first injection site was 1 cm from the midline of the cervical spine, 3 cm inferior to the lower border of the occipital protuberance. The second injection site was 1.5 cm superiorly and laterally to the first injection site.  -Trapezius muscle injection was performed at 3 sites, bilaterally. The first injection site was in the upper trapezius muscle halfway between the inflection point of the neck, and the acromion. The second injection site was one half way between the acromion and the first injection site. The third injection was done between the first injection site and the inflection point of the neck.   Will return for repeat injection in 3 months.   A 200 unit sof Botox was used, 155 units were injected, the rest of the Botox was wasted. The patient tolerated the procedure well, there were no complications of the above procedure.

## 2017-07-09 NOTE — Progress Notes (Signed)
Botox-100unitsx2 vials Lot: [id number] Expiration: 01/2020 NDC: 6384-6659-93 [id number]  0.9% Sodium Chloride- 38mL total Lot: [id number] Expiration: 10/2018 NDC: 3007-6226-33  Dx: H54.562 B/B

## 2017-07-17 ENCOUNTER — Other Ambulatory Visit: Payer: Self-pay | Admitting: Neurology

## 2017-07-17 ENCOUNTER — Ambulatory Visit: Payer: Medicare Other | Attending: Neurology | Admitting: Rehabilitative and Restorative Service Providers"

## 2017-07-17 ENCOUNTER — Telehealth: Payer: Self-pay | Admitting: Rehabilitative and Restorative Service Providers"

## 2017-07-17 DIAGNOSIS — R293 Abnormal posture: Secondary | ICD-10-CM | POA: Diagnosis not present

## 2017-07-17 DIAGNOSIS — M2550 Pain in unspecified joint: Secondary | ICD-10-CM

## 2017-07-17 DIAGNOSIS — M6281 Muscle weakness (generalized): Secondary | ICD-10-CM | POA: Insufficient documentation

## 2017-07-17 DIAGNOSIS — M542 Cervicalgia: Secondary | ICD-10-CM | POA: Diagnosis not present

## 2017-07-17 NOTE — Telephone Encounter (Signed)
Thanks Altha Harm, I placed a new PT order with your recommendations in the comments fields, hope this works I didn't see a specific order out there for joint manipulation thanks

## 2017-07-17 NOTE — Telephone Encounter (Signed)
Botox apt Scheduled For 10/11/2017 With Dr. Jaynee Eagles.

## 2017-07-17 NOTE — Therapy (Signed)
[facility] [address] [place], [place], [address] Phone: [phone]   Fax:  [phone]  Physical Therapy Evaluation  Patient Details  Name: Mitchell Baxter MRN: [id number] Date of Birth: 1951/05/20 Referring Provider: Heide Spark, MD  Encounter Date: 07/17/2017      PT End of Session - 07/17/17 1037    Visit Number 1   Number of Visits 12   Date for PT Re-Evaluation 09/15/17   Authorization Type G code every 10th visit   PT Start Time 0850   PT Stop Time 0930   PT Time Calculation (min) 40 min   Activity Tolerance Patient tolerated treatment well   Behavior During Therapy [facility] for tasks assessed/performed      Past Medical History:  Diagnosis Date  . COPD (chronic obstructive pulmonary disease) ([place])   . Depression   . GERD (gastroesophageal reflux disease)   . Headache(784.0)    migraine  . Hyperlipidemia   . Hypertension   . Shortness of breath    exertion  . Sleep apnea    stopbang=4  . Vertigo     Past Surgical History:  Procedure Laterality Date  . BRAVO [place] STUDY  11/29/2011   Procedure: BRAVO [place];  Surgeon: Landry Dyke, MD;  Location: [facility] ENDOSCOPY;  Service: Endoscopy;  Laterality: N/A;  . COLONOSCOPY  10/03/2012   Procedure: COLONOSCOPY;  Surgeon: Shann Medal, MD;  Location: [facility];  Service: General;  Laterality: N/A;  carol/leone  . HIATAL HERNIA REPAIR  04/16/2012   Procedure: LAPAROSCOPIC REPAIR OF HIATAL HERNIA;  Surgeon: Pedro Earls, MD;  Location: [facility] ORS;  Service: General;;  . LAPAROSCOPIC NISSEN FUNDOPLICATION  0/06/3817   Procedure: LAPAROSCOPIC NISSEN FUNDOPLICATION;  Surgeon: Pedro Earls, MD;  Location: [facility] ORS;  Service: General;  Laterality: N/A;  . none      There were no vitals filed for this visit.       Subjective Assessment - 07/17/17 0850    Subjective The patient describes significant neck pain pointing to right upper trapezius radiating  upward to suboccipital and posterior head pain.  He reports physicians haven't found anything to treat his pain noting "the medication quit working".  He had botox last week and notes he will be going through injections every 12 weeks x 3 times.  Patient notes symtpoms present x years/ migraines his entire life.    Patient Stated Goals Ease muscle tension in right side.    Currently in Pain? Yes   Pain Score 8   typical is 8/10, "not real bad today probably a 4/10"   Pain Location Neck   Pain Orientation Right   Pain Descriptors / Indicators Aching   Pain Type Chronic pain   Pain Radiating Towards up to head in right posterior aspect   Pain Onset More than a month ago   Pain Frequency Constant   Aggravating Factors  no known aggravating factors; spontaneous   Pain Relieving Factors no known relieving factors            [facility] PT Assessment - 07/17/17 0853      Assessment   Medical Diagnosis musculoskeletal neck pain, cervicalgia and occipital headache   Referring Provider Heide Spark, MD   Onset Date/Surgical Date --  chronic x years   Hand Dominance Right   Prior Therapy none     Precautions   Precautions None     Restrictions   Weight Bearing Restrictions No  Balance Screen   Has the patient fallen in the past 6 months No   Has the patient had a decrease in activity level because of a fear of falling?  No   Is the patient reluctant to leave their home because of a fear of falling?  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     Observation/Other Assessments   Focus on Therapeutic Outcomes (FOTO)  59%   Other Surveys  Other Surveys   Neck Disability  Index  42%     Sensation   Light Touch Appears Intact     Posture/Postural Control   Posture/Postural Control Postural limitations   Postural Limitations Increased thoracic kyphosis;Rounded Shoulders;Forward head     ROM / Strength   AROM / PROM / Strength AROM;Strength     AROM   AROM Assessment Site Cervical   Cervical Flexion 38   Cervical Extension 32   Cervical - Right Side Bend 28   Cervical - Left Side Bend 18  "it's stretching that muscle"   Cervical - Right Rotation 70   Cervical - Left Rotation 72     Palpation   Palpation comment Hypomobile lateral glides in mid cervical spine with tenderness over R facet joints due to tenderness with scalene palpation; Hypomobile upper and mid thoracic spine to P>A joint mobilization.  Patient tender R scalenes at origin and into insertion at first rib.  Tender to palpation R medial scapular musculature and R upper trapezius,  Also notes tenderness in suboccipitals.      Special Tests    Special Tests Cervical   Cervical Tests Dictraction;other     Distraction Test   Findngs Negative   Comment No change in symptoms with cervical distraction     other    Comment Overpressure creates a sensation of neck stretching, but not pain in joints.  Neural tension is positive for significant pain in bilateral chest muscles worse on the right and radiates into R elbow region.  Patient has weakness noted in scapular retraction when prone lifting shoulders from mat.            Vestibular Assessment - 07/17/17 0854      Vestibular Assessment   General Observation Patient notes dizzy spells and syncopal episodes when overheated + moving suddenly.  He denies hearing changes, denies vision changes.        Objective measurements completed on examination: See above findings.          [facility] Adult PT Treatment/Exercise - 07/17/17 0853      Exercises   Exercises Other Exercises   Other Exercises  Initiated HEP including: thoracic self  mobilization with towel roll, shoulder rolls, prone scapular retraction.  Also performed (not provided for HEP) seated lateral neck flexion with overpressure to the left, chin tuck supine with tactile cues.      Manual Therapy   Manual Therapy Joint mobilization   Manual therapy comments Prone   Joint Mobilization P>A grade II-III joint mobilizations.  Then assistived P>A movement with patient in prone on elbows.                 PT Education -  07/17/17 1036    Education provided Yes   Education Details HEP: shoulder retraction prone, shoulder circles, thoracic self mobilization, sitting posture in truck   Person(s) Educated Patient   Methods Explanation;Demonstration;Handout   Comprehension Verbalized understanding;Returned demonstration          PT Short Term Goals - 07/17/17 1043      PT SHORT TERM GOAL #1   Title The patient will be indep with HEP for neck stabilization, postural strengthening, and stretching.   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   Time 4   Period Weeks   Target Date 08/16/17     PT SHORT TERM GOAL #3   Title The patient will report pain at worst improved from 8/10 down to 6/10 to demo improving self mgmt of pain   Time 4   Period Weeks   Target Date 08/16/17           PT Long Term Goals - 07/17/17 1044      PT LONG TERM GOAL #1   Title The patient will reduce neck disability index from 42% to < or equal to 30% to demo improving self perception of disability.   Time 8   Period Weeks   Target Date 09/15/17     PT LONG TERM GOAL #2   Title The patient will improve neck AROM from 18 degrees L sidebending to 25 degrees L sidebending to demo improved flexibility R scalenes and upper trap.   Time 8   Period Weeks   Target Date 09/15/17     PT LONG TERM GOAL #3   Title The patient will  report resting neck pain 2/10 or less.   Time 8   Period Weeks   Target Date 09/15/17     PT LONG TERM GOAL #4   Title The patient will be further assessed on neck flexion endurance test and goals to follow as indicated.   Time 8   Period Weeks   Target Date 09/15/17                Plan - 07/17/17 1046    Clinical Impression Statement The patient is a 66 yo male referred to OP rehab due to chronic neck pain R side with occipital HA.  He began our session today at 4/10 pain and left with 1/10 pain responding well to initiation of treatment.  He presented with tightness in R scalenes, R upper trapezius, and suboccipital musculature.  He has limited joint mobility in thoracic spine (P>A direction) limiting posture, and in cervical spine worse in lateral glide and anterior>posterior direction (due to forward head positioning).    The patient has postural weakness with difficulty with scapular retraction and tightness in chest musculature.  PT to address deficits to promote reduction in headaches, self mgmt of chronic pain, and to improve postural stabilization for daily activities.    History and Personal Factors relevant to plan of care: chronic migraines, chronic neck pain, h/o vertigo, daily headaches   Clinical Presentation Evolving   Clinical Presentation due to: recent change in medical mgmt, worsening R shoulder pain   Clinical Decision Making Moderate   Rehab Potential Good   Clinical Impairments Affecting Rehab Potential chronic nature of symptoms, length of time since began, constant nature of symptoms  PT Frequency 2x / week   PT Duration 8 weeks  plan to reduce to 1x/week after 4 weeks   PT Treatment/Interventions ADLs/Self Care Home Management;Neuromuscular re-education;Therapeutic activities;Therapeutic exercise;Manual techniques;Passive range of motion;Patient/family education;Moist Heat;Cryotherapy;Dry needling   PT Next Visit Plan grade V manipulation of thoracic spine  if prescription returned; neural gliding; scalene muscle release; check HEP   Consulted and Agree with Plan of Care Patient      Patient will benefit from skilled therapeutic intervention in order to improve the following deficits and impairments:  Decreased strength, Postural dysfunction, Increased fascial restricitons, Decreased range of motion, Impaired flexibility, Hypomobility, Pain  Visit Diagnosis: Neck pain  Abnormal posture  Muscle weakness (generalized)      G-Codes - Aug 05, 2017 1052    Functional Assessment Tool Used (Outpatient Only) Neck disability index=42%   Functional Limitation Self care   Self Care Current Status (D5686) At least 40 percent but less than 60 percent impaired, limited or restricted   Self Care Goal Status (H6837) At least 20 percent but less than 40 percent impaired, limited or restricted       Problem List Patient Active Problem List   Diagnosis Date Noted  . Intractable chronic migraine without aura and with status migrainosus 06/06/2017  . Plantar fasciitis 09/26/2016  . Altered mental status 08/25/2015  . Tremor of right hand 08/25/2015  . Vision changes 08/25/2015  . Worsening headaches 08/25/2015  . New onset of headaches after age 16 08/25/2015  . Sinusitis, chronic 08/25/2015  . Hearing loss 08/25/2015  . Vertigo 08/25/2015  . Lap [place] June 2013 05/24/2012  . GERD (gastroesophageal reflux disease) 03/28/2012  . Chronic headaches 12/22/2011  . HTN (hypertension) 12/22/2011  . High cholesterol 12/22/2011  . Depression 12/22/2011    Laurencia Roma, PT 08/05/2017, 10:53 AM  [place] [address] [place], [place], [address] Phone: [phone]   Fax:  [phone]  Name: Mitchell Baxter MRN: [id number] Date of Birth: August 26, 1951

## 2017-07-17 NOTE — Telephone Encounter (Signed)
Dr. Jaynee Eagles, Mr. Coles would benefit from grade V joint manipulation in addition to therapeutic exercise, stretching, joint mobilizations, stretching and manual techniques.    Please place order in EPIC for grade V manipulation if you agree.  Thank you, Ripley Bogosian, PT

## 2017-07-17 NOTE — Patient Instructions (Addendum)
Thoracic Self-Mobilization (Supine)    With rolled towel placed lengthwise at lower ribs level, lie back on towel with arms outstretched. Hold _2 minutes. Relax. Repeat __1-2__ times per day as needed for postural stretching.  http://orth.exer.us/1001   Copyright  VHI. All rights reserved.   Healthy Back - Shoulder Roll    Stand straight with arms relaxed at sides. Roll shoulders backward continuously. Do __10__ times.  Can repeat throughout the day, as tolerated.  Copyright  VHI. All rights reserved.    Scapular Retraction (Prone)    Lie with arms at sides. Pinch shoulder blades together and raise arms a few inches from floor. Repeat _10___ times per set. Do __1__ sets per session. Do __2__ sessions per day.  http://orth.exer.us/954   Copyright  VHI. All rights reserved.   Posture - Sitting    Sit upright, head facing forward. Try using a roll to support lower back. Keep shoulders relaxed, and avoid rounded back. Keep hips level with knees. Avoid crossing legs for long periods.   Copyright  VHI. All rights reserved.

## 2017-07-19 ENCOUNTER — Ambulatory Visit: Payer: Medicare Other | Admitting: Rehabilitative and Restorative Service Providers"

## 2017-07-19 DIAGNOSIS — M542 Cervicalgia: Secondary | ICD-10-CM | POA: Diagnosis not present

## 2017-07-19 DIAGNOSIS — M6281 Muscle weakness (generalized): Secondary | ICD-10-CM | POA: Diagnosis not present

## 2017-07-19 DIAGNOSIS — R293 Abnormal posture: Secondary | ICD-10-CM

## 2017-07-19 NOTE — Patient Instructions (Signed)
Thoracic Self-Mobilization (Supine)    With rolled towel placed lengthwise at lower ribs level, lie back on towel with arms outstretched. Hold _2 minutes. Relax. Repeat __1-2__ times per day as needed for postural stretching.  http://orth.exer.us/1001   Copyright  VHI. All rights reserved.   Healthy Back - Shoulder Roll    Stand straight with arms relaxed at sides. Roll shoulders backward continuously. Do __10__ times.  Can repeat throughout the day, as tolerated.  Copyright  VHI. All rights reserved.    Scapular Retraction (Prone)    Lie with arms at sides. Pinch shoulder blades together and raise arms a few inches from floor. Repeat _10___ times per set. Do __1__ sets per session. Do __2__ sessions per day.  http://orth.exer.us/954   Copyright  VHI. All rights reserved.   Posture - Sitting    Sit upright, head facing forward. Try using a roll to support lower back. Keep shoulders relaxed, and avoid rounded back. Keep hips level with knees. Avoid crossing legs for long periods.   Copyright  VHI. All rights reserved.   Neurovascular: Median Nerve Glide With Cervical Bias    Stand with right arm out to side, palm flat against wall, thumb up, elbow straight. Slowly move opposite side ear toward shoulder as far as possible without pain. Hold for 10 seconds and repeat.  *CAN ALSO BEGIN BY GRASPING THE DOOR TRIM AND AND TURNING AWAY FROM YOUR HAND. Repeat __3__ times per set. Do __1__ sets per session. Do _2___ sessions per week.   Copyright  VHI. All rights reserved.

## 2017-07-19 NOTE — Therapy (Signed)
[facility] [address] [place], [place], [address] Phone: [phone]   Fax:  [phone]  Physical Therapy Treatment  Patient Details  Name: Mitchell Baxter MRN: [id number] Date of Birth: 09-25-1951 Referring Provider: Heide Spark, MD  Encounter Date: 07/19/2017      PT End of Session - 07/19/17 2013    Visit Number 2   Number of Visits 12   Date for PT Re-Evaluation 09/15/17   Authorization Type G code every 10th visit   PT Start Time 1105   PT Stop Time 1148   PT Time Calculation (min) 43 min   Activity Tolerance Patient tolerated treatment well   Behavior During Therapy [facility] for tasks assessed/performed      Past Medical History:  Diagnosis Date  . COPD (chronic obstructive pulmonary disease) ([place])   . Depression   . GERD (gastroesophageal reflux disease)   . Headache(784.0)    migraine  . Hyperlipidemia   . Hypertension   . Shortness of breath    exertion  . Sleep apnea    stopbang=4  . Vertigo     Past Surgical History:  Procedure Laterality Date  . BRAVO [place] STUDY  11/29/2011   Procedure: BRAVO [place];  Surgeon: Landry Dyke, MD;  Location: [facility];  Service: Endoscopy;  Laterality: N/A;  . COLONOSCOPY  10/03/2012   Procedure: COLONOSCOPY;  Surgeon: Shann Medal, MD;  Location: [facility];  Service: General;  Laterality: N/A;  carol/leone  . HIATAL HERNIA REPAIR  04/16/2012   Procedure: LAPAROSCOPIC REPAIR OF HIATAL HERNIA;  Surgeon: Pedro Earls, MD;  Location: [facility];  Service: General;;  . LAPAROSCOPIC NISSEN FUNDOPLICATION  7/56/4332   Procedure: LAPAROSCOPIC NISSEN FUNDOPLICATION;  Surgeon: Pedro Earls, MD;  Location: [facility];  Service: General;  Laterality: N/A;  . none      There were no vitals filed for this visit.      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  He rates pain 2/10.   Patient Stated Goals Ease muscle tension in right side.    Currently in Pain? Yes   Pain Score 2    Pain Location Neck   Pain Orientation Right   Pain Descriptors / Indicators Aching   Pain Type Chronic pain   Pain Onset More than a month ago   Pain Frequency Constant   Aggravating Factors  no known   Pain Relieving Factors no known            [facility] PT Assessment - 07/19/17 0001      AROM   AROM Assessment Site Cervical   Cervical Flexion 42   Cervical Extension 30   Cervical - Right Side Bend 32   Cervical - Left Side Bend 28   Cervical - Right Rotation 75   Cervical - Left Rotation 72                     [facility] Adult PT Treatment/Exercise - 07/19/17 1108      Exercises   Exercises Neck;Shoulder     Neck Exercises: Seated   Neck Retraction 5 reps;5 secs   Shoulder Rolls 5 reps;Backwards     Neck Exercises: Supine   Neck Retraction 10 reps;5 secs   Neck Retraction Limitations with  tactile cues   Capital Flexion 5 reps   Capital Flexion Limitations encouraging chin tuck prior to flexion     Shoulder Exercises: Supine   Horizontal ABduction AROM;Right;Left  x 2 minutes   Horizontal ABduction Limitations using towel roll under thoracic spine for thoracic extension and UE stretching/chest stretching.      Shoulder Exercises: Stretch   Other Shoulder Stretches Standing neural glide with UE holding trim at door x 2 reps each side.      Manual Therapy   Manual Therapy Joint mobilization;Soft tissue mobilization;Scapular mobilization;Neural Stretch;Manual Traction;Muscle Energy Technique   Manual therapy comments Joing mobilization/manipulation to gain improved ROM, reduce pain thoracic spine and neck.   Joint Mobilization PRONE:  P>A grade III-IV thoracic T2-T5 mobilization; SUPINE:  Grade V joint manipulation mid thoracic spine using supine technique with trunk  flexion over PT's hand for fulcrum of force.  Lateral glides of mid cervical spine R >L grade III-IV, SIDELYING: R>L lateral glides mid cervical spine Grade II-III   Soft tissue mobilization suboccipital release, right scalenes, right levator, right upper trapezius   Scapular Mobilization left sidelying, passive scapular mobility   Manual Traction supine gentle manual traction   Muscle Energy Technique PROM with overpressure, then performed contract/relax at tolerable range for muscle re-education   Neural Stretch Right UE neural stretch in supine (cervical sidebending not tolerated); then performed on L UE during towel roll stretch for thoracic spine as patient c/o tightness left side.                 PT Education - 07/19/17 1158    Education provided Yes   Education Details continue current HEP and added neural glide in door frame   Person(s) Educated Patient   Methods Explanation;Demonstration;Handout   Comprehension Verbalized understanding;Returned demonstration          PT Short Term Goals - 07/17/17 1043      PT SHORT TERM GOAL #1   Title The patient will be indep with HEP for neck stabilization, postural strengthening, and stretching.   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   Time 4   Period Weeks   Target Date 08/16/17     PT SHORT TERM GOAL #3   Title The patient will report pain at worst improved from 8/10 down to 6/10 to demo improving self mgmt of pain   Time 4   Period Weeks   Target Date 08/16/17           PT Long Term Goals - 07/17/17 1044      PT LONG TERM GOAL #1   Title The patient will reduce neck disability index from 42% to < or equal to 30% to demo improving self perception of disability.   Time 8   Period Weeks   Target Date 09/15/17     PT LONG TERM GOAL #2   Title The patient will  improve neck AROM from 18 degrees L sidebending to 25 degrees L sidebending to demo improved flexibility R scalenes and upper trap.   Time 8   Period Weeks   Target Date 09/15/17     PT LONG TERM GOAL #3   Title The patient will report resting neck pain 2/10 or less.   Time 8   Period Weeks  Target Date 09/15/17     PT LONG TERM GOAL #4   Title The patient will be further assessed on neck flexion endurance test and goals to follow as indicated.   Time 8   Period Weeks   Target Date 09/15/17               Plan - 07/19/17 2016    Clinical Impression Statement The patient presented today to OP rehab with less pain since evaluation/treatment and no headache this morning.  He notes improvement with manipulation/mobilization in session.  PT encouraging compliance with HEP to begin to set up long term exercise as way to self manage chronic pain.    PT Treatment/Interventions ADLs/Self Care Home Management;Neuromuscular re-education;Therapeutic activities;Therapeutic exercise;Manual techniques;Passive range of motion;Patient/family education;Moist Heat;Cryotherapy;Dry needling   PT Next Visit Plan Joint mobilization/manipulation for cervical and thoracic spine; neural gliding, scalene and levator muscle release, scapular depression and stabilizers, neck flexor stabilization   Consulted and Agree with Plan of Care Patient      Patient will benefit from skilled therapeutic intervention in order to improve the following deficits and impairments:  Decreased strength, Postural dysfunction, Increased fascial restricitons, Decreased range of motion, Impaired flexibility, Hypomobility, Pain  Visit Diagnosis: Neck pain  Abnormal posture  Muscle weakness (generalized)     Problem List Patient Active Problem List   Diagnosis Date Noted  . Intractable chronic migraine without aura and with status migrainosus 06/06/2017  . Plantar fasciitis 09/26/2016  . Altered mental status  08/25/2015  . Tremor of right hand 08/25/2015  . Vision changes 08/25/2015  . Worsening headaches 08/25/2015  . New onset of headaches after age 50 08/25/2015  . Sinusitis, chronic 08/25/2015  . Hearing loss 08/25/2015  . Vertigo 08/25/2015  . Lap Nissen June 2013 05/24/2012  . GERD (gastroesophageal reflux disease) 03/28/2012  . Chronic headaches 12/22/2011  . HTN (hypertension) 12/22/2011  . High cholesterol 12/22/2011  . Depression 12/22/2011    Lilyonna Steidle, PT 07/19/2017, 9:17 PM  [place] [address] [place], [place], [address] Phone: [phone]   Fax:  [phone]  Name: Mitchell Baxter MRN: [id number] Date of Birth: [birth year]

## 2017-07-26 ENCOUNTER — Ambulatory Visit: Payer: Medicare Other

## 2017-07-26 DIAGNOSIS — R293 Abnormal posture: Secondary | ICD-10-CM | POA: Diagnosis not present

## 2017-07-26 DIAGNOSIS — M542 Cervicalgia: Secondary | ICD-10-CM

## 2017-07-26 DIAGNOSIS — M6281 Muscle weakness (generalized): Secondary | ICD-10-CM

## 2017-07-26 NOTE — Therapy (Signed)
[facility] [address] [place], [place], [address] Phone: [phone]   Fax:  [phone]  Physical Therapy Treatment  Patient Details  Name: Mitchell Baxter MRN: [id number] Date of Birth: [birth year] Referring Provider: Heide Spark, MD  Encounter Date: 07/26/2017      PT End of Session - 07/26/17 1058    Visit Number 3   Number of Visits 12   Date for PT Re-Evaluation 09/15/17   Authorization Type G code every 10th visit   PT Start Time 0932   PT Stop Time 1013   PT Time Calculation (min) 41 min   Activity Tolerance Patient tolerated treatment well;No increased pain   Behavior During Therapy WFL for tasks assessed/performed      Past Medical History:  Diagnosis Date  . COPD (chronic obstructive pulmonary disease) ([place])   . Depression   . GERD (gastroesophageal reflux disease)   . Headache(784.0)    migraine  . Hyperlipidemia   . Hypertension   . Shortness of breath    exertion  . Sleep apnea    stopbang=4  . Vertigo     Past Surgical History:  Procedure Laterality Date  . BRAVO [place] STUDY  11/29/2011   Procedure: BRAVO [place];  Surgeon: Landry Dyke, MD;  [facility];  Service: Endoscopy;  Laterality: N/A;  . COLONOSCOPY  10/03/2012   Procedure: COLONOSCOPY;  Surgeon: Shann Medal, MD;  Location: [facility];  Service: General;  Laterality: N/A;  carol/leone  . HIATAL HERNIA REPAIR  04/16/2012   Procedure: LAPAROSCOPIC REPAIR OF HIATAL HERNIA;  Surgeon: Pedro Earls, MD;  [facility];  Service: General;;  . LAPAROSCOPIC NISSEN FUNDOPLICATION  4/85/4627   Procedure: LAPAROSCOPIC NISSEN FUNDOPLICATION;  Surgeon: Pedro Earls, MD;  Location[facility];  Service: General;  Laterality: N/A;  . none      There were no vitals filed for this visit.      Subjective Assessment - 07/26/17 0935    Subjective Pt reported no changes since last visit. Pt feels like he's getting  better overall. Pt has a little bit of LBP due to building a door over the weekend.    Patient Stated Goals Ease muscle tension in right side.    Currently in Pain? Yes   Pain Score 2    Pain Location Neck   Pain Orientation Right   Pain Descriptors / Indicators Aching   Pain Type Chronic pain   Pain Onset More than a month ago   Pain Frequency Constant   Aggravating Factors  no known   Pain Relieving Factors no known   Multiple Pain Sites Yes   Pain Score 1   Pain Location Back   Pain Orientation Lower   Pain Descriptors / Indicators Aching   Pain Type Acute pain   Pain Onset In the past 7 days   Pain Frequency Constant   Aggravating Factors  building door over the weekend (garage door)   Pain Relieving Factors no known                         [facility] Adult PT Treatment/Exercise - 07/26/17 1054      Neck Exercises: Supine   Other Supine Exercise Pt performed deep neck flexor activation with BP cuff placed under pt's head, instructed to maintain 79mmHg pressure for 5 sec. x10 reps.      Manual Therapy   Manual Therapy Joint mobilization;Soft  tissue mobilization;Manual Traction;Muscle Energy Technique   Manual therapy comments Joing mobilization/manipulation to gain improved ROM, reduce pain thoracic spine and neck.   Joint Mobilization Prone: P>A grade III-IV T2-T7 joint mobilization with 2 Grade V manipulations at T4-5. PT also performed lateral glides to mid-lower cx spine (grade III-IV), 3 bouts per vertebrae level for 30-45 seconds.   Soft tissue mobilization suboccipital release, right scalenes, right levator, right upper trapezius. PT performed soft tissue mobilization to R neck/shoulder musculature.    Manual Traction supine gentle manual traction   Muscle Energy Technique PROM with overpressure, then performed contract/relax at tolerable range for muscle re-education (UT and levator scap musculature).                  PT Short Term Goals -  07/17/17 1043      PT SHORT TERM GOAL #1   Title The patient will be indep with HEP for neck stabilization, postural strengthening, and stretching.   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   Time 4   Period Weeks   Target Date 08/16/17     PT SHORT TERM GOAL #3   Title The patient will report pain at worst improved from 8/10 down to 6/10 to demo improving self mgmt of pain   Time 4   Period Weeks   Target Date 08/16/17           PT Long Term Goals - 07/17/17 1044      PT LONG TERM GOAL #1   Title The patient will reduce neck disability index from 42% to < or equal to 30% to demo improving self perception of disability.   Time 8   Period Weeks   Target Date 09/15/17     PT LONG TERM GOAL #2   Title The patient will improve neck AROM from 18 degrees L sidebending to 25 degrees L sidebending to demo improved flexibility R scalenes and upper trap.   Time 8   Period Weeks   Target Date 09/15/17     PT LONG TERM GOAL #3   Title The patient will report resting neck pain 2/10 or less.   Time 8   Period Weeks   Target Date 09/15/17     PT LONG TERM GOAL #4   Title The patient will be further assessed on neck flexion endurance test and goals to follow as indicated.   Time 8   Period Weeks   Target Date 09/15/17               Plan - 07/26/17 1058    Clinical Impression Statement Pt experienced compensatory strategies during deep neck flexor exercise, indicating weak deep neck flexors and would benefit from further strengthening to improve posture and decr. pain. Pt demonstrated progress, as he reported 0/10 pain after PT session. Continue with POC.    PT Treatment/Interventions ADLs/Self Care Home Management;Neuromuscular re-education;Therapeutic activities;Therapeutic exercise;Manual techniques;Passive range of  motion;Patient/family education;Moist Heat;Cryotherapy;Dry needling   PT Next Visit Plan Continue Joint mobilization/manipulation for cervical and thoracic spine; neural gliding, scalene and levator muscle release, scapular depression and stabilizers, neck flexor stabilization   Consulted and Agree with Plan of Care Patient      Patient will benefit from skilled therapeutic intervention in order to improve the following deficits and  impairments:  Decreased strength, Postural dysfunction, Increased fascial restricitons, Decreased range of motion, Impaired flexibility, Hypomobility, Pain  Visit Diagnosis: Neck pain  Abnormal posture  Muscle weakness (generalized)     Problem List Patient Active Problem List   Diagnosis Date Noted  . Intractable chronic migraine without aura and with status migrainosus 06/06/2017  . Plantar fasciitis 09/26/2016  . Altered mental status 08/25/2015  . Tremor of right hand 08/25/2015  . Vision changes 08/25/2015  . Worsening headaches 08/25/2015  . New onset of headaches after age 30 08/25/2015  . Sinusitis, chronic 08/25/2015  . Hearing loss 08/25/2015  . Vertigo 08/25/2015  . Lap Nissen June 2013 05/24/2012  . GERD (gastroesophageal reflux disease) 03/28/2012  . Chronic headaches 12/22/2011  . HTN (hypertension) 12/22/2011  . High cholesterol 12/22/2011  . Depression 12/22/2011    Miller,Jennifer L 07/26/2017, 11:00 AM  [facility] [address] [place], [place], [address] Phone: [phone]   Fax:  [phone]  Name: Mitchell Baxter MRN: [id number] Date of Birth: August 14, 1951  Geoffry Paradise, PT,DPT 07/26/17 11:00 AM Phone: [phone] Fax: [phone]

## 2017-07-27 ENCOUNTER — Ambulatory Visit: Payer: Medicare Other

## 2017-07-27 DIAGNOSIS — R293 Abnormal posture: Secondary | ICD-10-CM

## 2017-07-27 DIAGNOSIS — M542 Cervicalgia: Secondary | ICD-10-CM | POA: Diagnosis not present

## 2017-07-27 DIAGNOSIS — M6281 Muscle weakness (generalized): Secondary | ICD-10-CM | POA: Diagnosis not present

## 2017-07-27 NOTE — Therapy (Signed)
[facility] [address] [place], [place], [address] Phone: [phone]   Fax:  [phone]  Physical Therapy Treatment  Patient Details  Name: Mitchell Baxter MRN: [id number] Date of Birth: 1951/07/10 Referring Provider: Heide Spark, MD  Encounter Date: 07/27/2017      PT End of Session - 07/27/17 1028    Visit Number 4   Number of Visits 12   Date for PT Re-Evaluation 09/15/17   Authorization Type G code every 10th visit   PT Start Time 0845   PT Stop Time 0826   PT Time Calculation (min) 1421 min   Activity Tolerance Patient tolerated treatment well;No increased pain   Behavior During Therapy WFL for tasks assessed/performed      Past Medical History:  Diagnosis Date  . COPD (chronic obstructive pulmonary disease) ([place])   . Depression   . GERD (gastroesophageal reflux disease)   . Headache(784.0)    migraine  . Hyperlipidemia   . Hypertension   . Shortness of breath    exertion  . Sleep apnea    stopbang=4  . Vertigo     Past Surgical History:  Procedure Laterality Date  . BRAVO [place] STUDY  11/29/2011   Procedure: BRAVO [place];  Surgeon: Landry Dyke, MD;  [facility];  Service: Endoscopy;  Laterality: N/A;  . COLONOSCOPY  10/03/2012   Procedure: COLONOSCOPY;  Surgeon: Shann Medal, MD;  Location: [facility];  Service: General;  Laterality: N/A;  carol/leone  . HIATAL HERNIA REPAIR  04/16/2012   Procedure: LAPAROSCOPIC REPAIR OF HIATAL HERNIA;  Surgeon: Pedro Earls, MD;  [facility];  Service: General;;  . LAPAROSCOPIC NISSEN FUNDOPLICATION  6/57/9038   Procedure: LAPAROSCOPIC NISSEN FUNDOPLICATION;  Surgeon: Pedro Earls, MD;  [facility];  Service: General;  Laterality: N/A;  . none      There were no vitals filed for this visit.      Subjective Assessment - 07/27/17 0847    Subjective Pt denied changes since last session.    Patient Stated Goals Ease  muscle tension in right side.    Currently in Pain? Yes   Pain Score 5    Pain Location Neck   Pain Orientation Right   Pain Descriptors / Indicators Aching   Pain Type Chronic pain   Pain Onset More than a month ago   Pain Frequency Constant   Aggravating Factors  no known   Pain Relieving Factors no known          Therex: Please see pt instructions for HEP details. No incr. In pain during session. Cues and demo for technique.               [facility] Adult PT Treatment/Exercise - 07/27/17 1025      Manual Therapy   Manual Therapy Joint mobilization;Soft tissue mobilization;Manual Traction;Muscle Energy Technique   Manual therapy comments Pt reported pain reduction to 2-3/10 after manual therapy.   Joint Mobilization Supine: PA and lateral glides to C2-6, grade III-IV, 2-3 bouts of 30-45 seconds. Performed to improve Cx ROM and reduce pain.   Soft tissue mobilization Suboccipital release 3x30sec. PT performed soft tissue mobilization to R upper trap and levator scap. to reduce pain.   Manual Traction supine gentle manual traction x30-60 sec. holdsx2   Muscle Energy Technique PROM with overpressure, then performed contract/relax at tolerable range for muscle re-education (UT and levator scap musculature).  PT Education - 07/27/17 1028    Education provided Yes   Education Details PT reviewed and progressed HEP as tolerated.    Person(s) Educated Patient   Methods Explanation;Demonstration;Verbal cues;Handout   Comprehension Returned demonstration;Verbalized understanding          PT Short Term Goals - 07/17/17 1043      PT SHORT TERM GOAL #1   Title The patient will be indep with HEP for neck stabilization, postural strengthening, and stretching.   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   Time 4    Period Weeks   Target Date 08/16/17     PT SHORT TERM GOAL #3   Title The patient will report pain at worst improved from 8/10 down to 6/10 to demo improving self mgmt of pain   Time 4   Period Weeks   Target Date 08/16/17           PT Long Term Goals - 07/17/17 1044      PT LONG TERM GOAL #1   Title The patient will reduce neck disability index from 42% to < or equal to 30% to demo improving self perception of disability.   Time 8   Period Weeks   Target Date 09/15/17     PT LONG TERM GOAL #2   Title The patient will improve neck AROM from 18 degrees L sidebending to 25 degrees L sidebending to demo improved flexibility R scalenes and upper trap.   Time 8   Period Weeks   Target Date 09/15/17     PT LONG TERM GOAL #3   Title The patient will report resting neck pain 2/10 or less.   Time 8   Period Weeks   Target Date 09/15/17     PT LONG TERM GOAL #4   Title The patient will be further assessed on neck flexion endurance test and goals to follow as indicated.   Time 8   Period Weeks   Target Date 09/15/17               Plan - 07/27/17 1028    Clinical Impression Statement Pt demonstrated progress during session, as pain reduced from 5/10 to 2-3/10 at end of session. PT reiterated the importance of performing HEP in order to carryover reduced pain between PT sessions, as pt reported he has not been performing HEP at home. PT added deep neck flexor and cervical retraction to improve strength and posture. Continue with POC.    PT Treatment/Interventions ADLs/Self Care Home Management;Neuromuscular re-education;Therapeutic activities;Therapeutic exercise;Manual techniques;Passive range of motion;Patient/family education;Moist Heat;Cryotherapy;Dry needling   PT Next Visit Plan Continue Joint mobilization/manipulation for cervical and thoracic spine; neural gliding, scalene and levator muscle release, scapular depression and stabilizers, neck flexor stabilization    Consulted and Agree with Plan of Care Patient      Patient will benefit from skilled therapeutic intervention in order to improve the following deficits and impairments:  Decreased strength, Postural dysfunction, Increased fascial restricitons, Decreased range of motion, Impaired flexibility, Hypomobility, Pain  Visit Diagnosis: Neck pain  Abnormal posture  Muscle weakness (generalized)     Problem List Patient Active Problem List   Diagnosis Date Noted  . Intractable chronic migraine without aura and with status migrainosus 06/06/2017  . Plantar fasciitis 09/26/2016  .  Altered mental status 08/25/2015  . Tremor of right hand 08/25/2015  . Vision changes 08/25/2015  . Worsening headaches 08/25/2015  . New onset of headaches after age 59 08/25/2015  . Sinusitis, chronic 08/25/2015  . Hearing loss 08/25/2015  . Vertigo 08/25/2015  . Lap Nissen June 2013 05/24/2012  . GERD (gastroesophageal reflux disease) 03/28/2012  . Chronic headaches 12/22/2011  . HTN (hypertension) 12/22/2011  . High cholesterol 12/22/2011  . Depression 12/22/2011    Lissa Rowles L 07/27/2017, 10:31 AM  [facility] [address] [place], [place], [address] Phone: [phone]   Fax:  [phone]  Name: Mitchell Baxter MRN: [id number] Date of Birth: [birth year]  Geoffry Paradise, PT,DPT 07/27/17 10:32 AM Phone: [phone] Fax: [phone]

## 2017-07-27 NOTE — Patient Instructions (Signed)
TRAVEL BUDDY: for driving.  https://www.travelbuddyposture.com/info.html   Thoracic Self-Mobilization (Supine)    With rolled towel placed lengthwise at lower ribs level, lie back on towel with arms outstretched. Hold _2 minutes. Relax. Repeat __1-2__ times per day as needed for postural stretching.  http://orth.exer.us/1001   Copyright  VHI. All rights reserved.   Healthy Back - Shoulder Roll    Stand straight with arms relaxed at sides. Roll shoulders backward continuously. Do __10__ times.  Can repeat throughout the day, as tolerated.  Copyright  VHI. All rights reserved.    Scapular Retraction (Prone)    Lie with arms at sides. Pinch shoulder blades together and raise arms a few inches from floor. Repeat _10___ times per set. Do __1__ sets per session. Do __2__ sessions per day.  http://orth.exer.us/954   Copyright  VHI. All rights reserved.   Posture - Sitting    Sit upright, head facing forward. Try using a roll to support lower back. Keep shoulders relaxed, and avoid rounded back. Keep hips level with knees. Avoid crossing legs for long periods.   Copyright  VHI. All rights reserved.    Neurovascular: Median Nerve Glide With Cervical Bias    Stand with right arm out to side, palm flat against wall, thumb up, elbow straight. Slowly move opposite side ear toward shoulder as far as possible without pain. Hold for 10 seconds and repeat.  *CAN ALSO BEGIN BY GRASPING THE DOOR TRIM AND AND TURNING AWAY FROM YOUR HAND. Repeat __3__ times per set. Do __1__ sets per session. Do _2___ sessions per week.    Cervical Retraction: Lie on back, with head on pillow. Tuck chin and press back of head into pillow, hold for 2 seconds. Perform 2 sets of 5 reps. Perform 3 days a week.   Nod: Deep Cervical Flexor Retrain - Supine    Nod head, tipping chin down. Tighten muscles in back of throat. Hold _2__ seconds. Do __10_ times, perform 2 sets. __3_ times  per week.  http://ss.exer.us/192   Copyright  VHI. All rights reserved.

## 2017-07-31 ENCOUNTER — Ambulatory Visit: Payer: Medicare Other | Attending: Neurology | Admitting: Rehabilitative and Restorative Service Providers"

## 2017-07-31 DIAGNOSIS — R293 Abnormal posture: Secondary | ICD-10-CM

## 2017-07-31 DIAGNOSIS — M6281 Muscle weakness (generalized): Secondary | ICD-10-CM | POA: Diagnosis not present

## 2017-07-31 DIAGNOSIS — M542 Cervicalgia: Secondary | ICD-10-CM | POA: Insufficient documentation

## 2017-07-31 NOTE — Therapy (Signed)
[facility] [address] [place], [place], [address] Phone: [phone]   Fax:  [phone]  Physical Therapy Treatment  Patient Details  Name: [facility] MRN: [id number] Date of Birth: January 21, 1951 Referring Provider: Heide Spark, MD  Encounter Date: 07/31/2017      PT End of Session - 07/31/17 0926    Visit Number 5   Number of Visits 12   Date for PT Re-Evaluation 09/15/17   Authorization Type G code every 10th visit   PT Start Time 0848   PT Stop Time 0930   PT Time Calculation (min) 42 min   Activity Tolerance Patient tolerated treatment well;No increased pain   Behavior During Therapy WFL for tasks assessed/performed      Past Medical History:  Diagnosis Date  . COPD (chronic obstructive pulmonary disease) ([place])   . Depression   . GERD (gastroesophageal reflux disease)   . Headache(784.0)    migraine  . Hyperlipidemia   . Hypertension   . Shortness of breath    exertion  . Sleep apnea    stopbang=4  . Vertigo     Past Surgical History:  Procedure Laterality Date  . BRAVO [place] STUDY  11/29/2011   Procedure: BRAVO [place];  Surgeon: Landry Dyke, MD;  Location: [facility];  Service: Endoscopy;  Laterality: N/A;  . COLONOSCOPY  10/03/2012   Procedure: COLONOSCOPY;  Surgeon: Shann Medal, MD;  Location: [facility];  Service: General;  Laterality: N/A;  carol/leone  . HIATAL HERNIA REPAIR  04/16/2012   Procedure: LAPAROSCOPIC REPAIR OF HIATAL HERNIA;  Surgeon: Pedro Earls, MD;  Location: [facility];  Service: General;;  . LAPAROSCOPIC NISSEN FUNDOPLICATION  9/38/1829   Procedure: LAPAROSCOPIC NISSEN FUNDOPLICATION;  Surgeon: Pedro Earls, MD;  Location: [facility];  Service: General;  Laterality: N/A;  . none      There were no vitals filed for this visit.      Subjective Assessment - 07/31/17 0849    Subjective The patient reports 0/10 pain at rest today.  He notes that he gets  gradual increase in pain t/o the day when fatigued that can still increase to 8/10.  Headaches are still occurring every day in the right postero/lateral region.    Patient Stated Goals Ease muscle tension in right side.    Currently in Pain? No/denies            [facility] PT Assessment - 07/31/17 0922      AROM   Cervical Flexion 42   Cervical Extension 60   Cervical - Right Side Bend 35   Cervical - Left Side Bend 30   Cervical - Right Rotation 72   Cervical - Left Rotation 72                     [facility] Adult PT Treatment/Exercise - 07/31/17 0852      Exercises   Exercises Neck;Shoulder     Neck Exercises: Machines for Strengthening   UBE (Upper Arm Bike) 2 minutes forward, 2 minutes backwards level 4     Neck Exercises: Supine   Neck Retraction 10 reps     Neck Exercises: Prone   Axial Exentsion 10 reps   Axial Extension Limitations prone on elbows   W Back 10 reps   Shoulder Extension 5 reps   Other Prone Exercise Quadriped trunk rotation with scapular stretch "thread the needle".     Shoulder Exercises: Supine  Other Supine Exercises thoracic mobilization over towel roll with "V" for UEs.     Manual Therapy   Manual Therapy Joint mobilization;Soft tissue mobilization;Manual Traction;Neural Stretch;Muscle Energy Technique   Manual therapy comments For improving range of motion in cervical and thoracic spine   Joint Mobilization PRONE:  Grade III-IV thoracic mobilization P>A.     Soft tissue mobilization Suboccipital relase   Manual Traction supine gentle manual traction   Muscle Energy Technique PROM with overpressure   Neural Stretch Supine UE nerve gliding x 10 reps.                   PT Short Term Goals - 07/17/17 1043      PT SHORT TERM GOAL #1   Title The patient will be indep with HEP for neck stabilization, postural strengthening, and stretching.   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   Time 4   Period Weeks   Target Date 08/16/17     PT SHORT TERM GOAL #3   Title The patient will report pain at worst improved from 8/10 down to 6/10 to demo improving self mgmt of pain   Time 4   Period Weeks   Target Date 08/16/17           PT Long Term Goals - 07/31/17 1119      PT LONG TERM GOAL #1   Title The patient will reduce neck disability index from 42% to < or equal to 30% to demo improving self perception of disability.   Time 8   Period Weeks     PT LONG TERM GOAL #2   Title The patient will improve neck AROM from 18 degrees L sidebending to 25 degrees L sidebending to demo improved flexibility R scalenes and upper trap.   Baseline L sidebending 30 degrees.   Time 8   Period Weeks   Status Achieved     PT LONG TERM GOAL #3   Title The patient will report resting neck pain 2/10 or less.   Baseline 0/10 at rest in the mornings.  Awaking 2 days in a row without headache (07/31/17)   Time 8   Period Weeks   Status Achieved     PT LONG TERM GOAL #4   Title The patient will be further assessed on neck flexion endurance test and goals to follow as indicated.   Baseline Patient holds 40 seconds.   Time 8   Period Weeks   Status Achieved               Plan - 07/31/17 1120    Clinical Impression Statement The patient met 2 LTGs and d/c LTG for neck endurance test as he scored WNLs.  Patient is improving with resting neck pain, mild change in HA (no HA upon waking x 2 days).  He does note continued pain in afternoon in neck that appears related to fatigue and stress.  PT workin iwht patient to incorporate exercises into daily activities.    PT Treatment/Interventions ADLs/Self Care Home Management;Neuromuscular re-education;Therapeutic activities;Therapeutic exercise;Manual techniques;Passive range of motion;Patient/family education;Moist  Heat;Cryotherapy;Dry needling   PT Next Visit Plan Check STGs, deep neck endurance, scapular depression/stabilizers, neural gliding, joint mobilization/manipulation as able.   Consulted and Agree with Plan of Care Patient  Patient will benefit from skilled therapeutic intervention in order to improve the following deficits and impairments:  Decreased strength, Postural dysfunction, Increased fascial restricitons, Decreased range of motion, Impaired flexibility, Hypomobility, Pain  Visit Diagnosis: Neck pain  Abnormal posture  Muscle weakness (generalized)     Problem List Patient Active Problem List   Diagnosis Date Noted  . Intractable chronic migraine without aura and with status migrainosus 06/06/2017  . Plantar fasciitis 09/26/2016  . Altered mental status 08/25/2015  . Tremor of right hand 08/25/2015  . Vision changes 08/25/2015  . Worsening headaches 08/25/2015  . New onset of headaches after age 50 08/25/2015  . Sinusitis, chronic 08/25/2015  . Hearing loss 08/25/2015  . Vertigo 08/25/2015  . Lap Nissen June 2013 05/24/2012  . GERD (gastroesophageal reflux disease) 03/28/2012  . Chronic headaches 12/22/2011  . HTN (hypertension) 12/22/2011  . High cholesterol 12/22/2011  . Depression 12/22/2011    ,, PT 07/31/2017, 11:22 AM   [facility] [address] [place], [place], [address] Phone: [phone]   Fax:  [phone]  Name: [facility] MRN: [id number] Date of Birth: 12/18/1950   

## 2017-08-02 ENCOUNTER — Ambulatory Visit: Payer: Medicare Other | Admitting: Rehabilitative and Restorative Service Providers"

## 2017-08-02 DIAGNOSIS — M6281 Muscle weakness (generalized): Secondary | ICD-10-CM

## 2017-08-02 DIAGNOSIS — R293 Abnormal posture: Secondary | ICD-10-CM | POA: Diagnosis not present

## 2017-08-02 DIAGNOSIS — M542 Cervicalgia: Secondary | ICD-10-CM

## 2017-08-02 NOTE — Therapy (Signed)
[facility] [address] [place], [place], [address] Phone: [phone]   Fax:  [phone]  Physical Therapy Treatment  Patient Details  Name: [facility] MRN: [id number] Date of Birth: Nov 05, 1950 Referring Provider: Heide Spark, MD  Encounter Date: 08/02/2017      PT End of Session - 08/02/17 1044    Visit Number 6   Number of Visits 12   Date for PT Re-Evaluation 09/15/17   Authorization Type G code every 10th visit   PT Start Time 0940   PT Stop Time 1020   PT Time Calculation (min) 40 min   Activity Tolerance Patient tolerated treatment well;No increased pain   Behavior During Therapy WFL for tasks assessed/performed      Past Medical History:  Diagnosis Date  . COPD (chronic obstructive pulmonary disease) ([place])   . Depression   . GERD (gastroesophageal reflux disease)   . Headache(784.0)    migraine  . Hyperlipidemia   . Hypertension   . Shortness of breath    exertion  . Sleep apnea    stopbang=4  . Vertigo     Past Surgical History:  Procedure Laterality Date  . BRAVO [place] STUDY  11/29/2011   Procedure: BRAVO [place];  Surgeon: Landry Dyke, MD;  Location: [facility];  Service: Endoscopy;  Laterality: N/A;  . COLONOSCOPY  10/03/2012   Procedure: COLONOSCOPY;  Surgeon: Shann Medal, MD;  Location: [facility];  Service: General;  Laterality: N/A;  carol/leone  . HIATAL HERNIA REPAIR  04/16/2012   Procedure: LAPAROSCOPIC REPAIR OF HIATAL HERNIA;  Surgeon: Pedro Earls, MD;  Location: [facility];  Service: General;;  . LAPAROSCOPIC NISSEN FUNDOPLICATION  01/26/5187   Procedure: LAPAROSCOPIC NISSEN FUNDOPLICATION;  Surgeon: Pedro Earls, MD;  Location: [facility];  Service: General;  Laterality: N/A;  . none      There were no vitals filed for this visit.      Subjective Assessment - 08/02/17 0939    Subjective The patient notes soreness and fatigued feeling in his neck.  He has  been doing more exercise at home.  "I may be overdoing it a little bit."  Headache is 2/10.     Patient Stated Goals Ease muscle tension in right side.    Currently in Pain? Yes   Pain Score 2    Pain Location Neck   Pain Orientation Right   Pain Descriptors / Indicators Aching   Pain Type Chronic pain   Pain Onset More than a month ago   Pain Frequency Constant   Aggravating Factors  no known   Pain Relieving Factors no known                         [facility] Adult PT Treatment/Exercise - 08/02/17 0941      Neck Exercises: Seated   Other Seated Exercise Self thoracic extension leaning over back of chair.      Neck Exercises: Supine   Neck Retraction 5 reps   Neck Retraction Limitations With tactile cues; added retraction + cervical rotation.      Neck Exercises: Prone   W Back 10 reps   W Back Limitations Tactile cues for muscle contraction   Other Prone Exercise Prone on elbows with thoracic extension.  Prone press ups x 5 reps.      Manual Therapy   Manual Therapy Joint mobilization;Soft tissue mobilization;Manual Traction;Muscle Energy Technique   Manual therapy  comments Joint mobilization/manipulation for pain reduction, positioning and joint mobility   Joint Mobilization PRONE:  Thoracic P>A grade III-IV upper and mid thoracic spine; SUPINE: lateral glide mid to low cervical spine R >L; upglides on L and down glides on R performing at grade II-III.  Mid-Thoracic grade V manipulation without caveatation today.  Right first rib depression grade II-III.   Soft tissue mobilization Suboccipital relase; Upper trapezius and Right scalene release with deep pressure   Scapular Mobilization Scapular depression prone with patient performing active depression and PT providing overpressure.   Manual Traction supine gentle manual traction   Muscle Energy Technique PROM with overpressure                PT Education - 08/02/17 1044    Education provided Yes    Education Details PT recommended patient decrease repetitions by 50% at this time and he is noting increased fatigue and pain.  He feels muscles are "tired" because he has been working on his HEP more frequnetly.   Person(s) Educated Patient   Methods Explanation;Demonstration;Handout   Comprehension Verbalized understanding;Returned demonstration          PT Short Term Goals - 07/17/17 1043      PT SHORT TERM GOAL #1   Title The patient will be indep with HEP for neck stabilization, postural strengthening, and stretching.   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   Time 4   Period Weeks   Target Date 08/16/17     PT SHORT TERM GOAL #3   Title The patient will report pain at worst improved from 8/10 down to 6/10 to demo improving self mgmt of pain   Time 4   Period Weeks   Target Date 08/16/17           PT Long Term Goals - 07/31/17 1119      PT LONG TERM GOAL #1   Title The patient will reduce neck disability index from 42% to < or equal to 30% to demo improving self perception of disability.   Time 8   Period Weeks     PT LONG TERM GOAL #2   Title The patient will improve neck AROM from 18 degrees L sidebending to 25 degrees L sidebending to demo improved flexibility R scalenes and upper trap.   Baseline L sidebending 30 degrees.   Time 8   Period Weeks   Status Achieved     PT LONG TERM GOAL #3   Title The patient will report resting neck pain 2/10 or less.   Baseline 0/10 at rest in the mornings.  Awaking 2 days in a row without headache (07/31/17)   Time 8   Period Weeks   Status Achieved     PT LONG TERM GOAL #4   Title The patient will be further assessed on neck flexion endurance test and goals to follow as indicated.   Baseline Patient holds 40 seconds.   Time 8   Period Weeks   Status Achieved                Plan - 08/02/17 1050    Clinical Impression Statement The patient is participating in HEP and notes increased fatigue with pain.  He also notes pain increases during day letting PT know that  muscle fatigue/weakness contributing to pain.  Recommended he continue current HEP, however reduce repetition to improve tolerance.    PT Treatment/Interventions ADLs/Self Care Home Management;Neuromuscular re-education;Therapeutic activities;Therapeutic exercise;Manual techniques;Passive range of motion;Patient/family education;Moist Heat;Cryotherapy;Dry needling   PT Next Visit Plan Check STGs, deep neck endurance, scapular depression/stabilizers, neural gliding, joint mobilization/manipulation as able.   Consulted and Agree with Plan of Care Patient      Patient will benefit from skilled therapeutic intervention in order to improve the following deficits and impairments:  Decreased strength, Postural dysfunction, Increased fascial restricitons, Decreased range of motion, Impaired flexibility, Hypomobility, Pain  Visit Diagnosis: Neck pain  Abnormal posture  Muscle weakness (generalized)     Problem List Patient Active Problem List   Diagnosis Date Noted  . Intractable chronic migraine without aura and with status migrainosus 06/06/2017  . Plantar fasciitis 09/26/2016  . Altered mental status 08/25/2015  . Tremor of right hand 08/25/2015  . Vision changes 08/25/2015  . Worsening headaches 08/25/2015  . New onset of headaches after age 57 08/25/2015  . Sinusitis, chronic 08/25/2015  . Hearing loss 08/25/2015  . Vertigo 08/25/2015  . Lap Nissen June 2013 05/24/2012  . GERD (gastroesophageal reflux disease) 03/28/2012  . Chronic headaches 12/22/2011  . HTN (hypertension) 12/22/2011  . High cholesterol 12/22/2011  . Depression 12/22/2011    Lauralye Kinn , PT 08/02/2017, 10:52 AM  [facility] [address]  [place], [place], [address] Phone: [phone]   Fax:  [phone]  Name: [facility] MRN: [id number] Date of Birth: 04/09/1951

## 2017-08-07 ENCOUNTER — Ambulatory Visit: Payer: Medicare Other

## 2017-08-09 ENCOUNTER — Ambulatory Visit: Payer: Medicare Other | Admitting: Rehabilitative and Restorative Service Providers"

## 2017-08-09 DIAGNOSIS — R293 Abnormal posture: Secondary | ICD-10-CM

## 2017-08-09 DIAGNOSIS — M542 Cervicalgia: Secondary | ICD-10-CM | POA: Diagnosis not present

## 2017-08-09 DIAGNOSIS — M6281 Muscle weakness (generalized): Secondary | ICD-10-CM

## 2017-08-09 NOTE — Patient Instructions (Signed)
TRAVEL BUDDY: for driving.  [email or web address].travelbuddyposture.com/info.html   Thoracic Self-Mobilization (Supine)    With rolled towel placed lengthwise at lower ribs level, lie back on towel with arms outstretched. Hold _2 minutes. Relax. Repeat __1-2__ times per day as needed for postural stretching.  http://orth.exer.us/1001  Copyright  VHI. All rights reserved.  Healthy Back - Shoulder Roll    Stand straight with arms relaxed at sides. Roll shoulders backward continuously. Do __10__ times. Can repeat throughout the day, as tolerated.  Copyright  VHI. All rights reserved.   Scapular Retraction (Prone)    Lie with arms at sides. Pinch shoulder blades together and raise arms a few inches from floor. Repeat _10___ times per set. Do __1__ sets per session. Do __2__ sessions per day.  http://orth.exer.us/954  Copyright  VHI. All rights reserved.  Posture - Sitting    Sit upright, head facing forward. Try using a roll to support lower back. Keep shoulders relaxed, and avoid rounded back. Keep hips level with knees. Avoid crossing legs for long periods.   Copyright  VHI. All rights reserved.   Neurovascular: Median Nerve Glide With Cervical Bias    Stand with right arm out to side, palm flat against wall, thumb up, elbow straight. Slowly move opposite side ear toward shoulder as far as possible without pain. Hold for 10 seconds and repeat.  *CAN ALSO BEGIN BY GRASPING THE DOOR TRIM AND AND TURNING AWAY FROM YOUR HAND. Repeat __3__ times per set. Do __1__ sets per session. Do _2___ sessions per week.    Cervical Retraction: Lie on back, with head on pillow. Tuck chin and press back of head into pillow, hold for 2 seconds. Perform 2 sets of 5 reps. Perform 3 days a week.   Nod: Deep Cervical Flexor Retrain - Supine    Nod head, tipping chin down. Tighten muscles in back of throat. Hold _2__ seconds. Do __10_ times, perform 2 sets. __3_  times per week.  http://ss.exer.us/192   Copyright  VHI. All rights reserved.   Scalene Stretch    With hand tucked under hip on side to be stretched, use opposite hand to grasp head and gently tilt it away from that side. Hold __30__ seconds. Repeat on other side. Repeat __3__ times. Do __2__ sessions per day.  http://[email or web address].exer.us/26   Copyright  VHI. All rights reserved.

## 2017-08-10 NOTE — Therapy (Signed)
[facility] [address] [place], [place], [address] Phone: [phone]   Fax:  [phone]  Physical Therapy Treatment  Patient Details  Name: Mitchell Baxter MRN: [id number] Date of Birth: 1951/10/03 Referring Provider: Heide Spark, MD  Encounter Date: 08/09/2017      PT End of Session - 08/09/17 2136    Visit Number 7   Number of Visits 12   Date for PT Re-Evaluation 09/15/17   Authorization Type G code every 10th visit   PT Start Time 0851   PT Stop Time 0930   PT Time Calculation (min) 39 min   Activity Tolerance Patient tolerated treatment well;No increased pain   Behavior During Therapy WFL for tasks assessed/performed      Past Medical History:  Diagnosis Date  . COPD (chronic obstructive pulmonary disease) ([place])   . Depression   . GERD (gastroesophageal reflux disease)   . Headache(784.0)    migraine  . Hyperlipidemia   . Hypertension   . Shortness of breath    exertion  . Sleep apnea    stopbang=4  . Vertigo     Past Surgical History:  Procedure Laterality Date  . BRAVO [place] STUDY  11/29/2011   Procedure: BRAVO [place];  Surgeon: Landry Dyke, MD;  Location: [facility];  Service: Endoscopy;  Laterality: N/A;  . COLONOSCOPY  10/03/2012   Procedure: COLONOSCOPY;  Surgeon: Shann Medal, MD;  Location: [facility];  Service: General;  Laterality: N/A;  carol/leone  . HIATAL HERNIA REPAIR  04/16/2012   Procedure: LAPAROSCOPIC REPAIR OF HIATAL HERNIA;  Surgeon: Pedro Earls, MD;  Location: [facility];  Service: General;;  . LAPAROSCOPIC NISSEN FUNDOPLICATION  06/13/9469   Procedure: LAPAROSCOPIC NISSEN FUNDOPLICATION;  Surgeon: Pedro Earls, MD;  Location: [facility];  Service: General;  Laterality: N/A;  . none      There were no vitals filed for this visit.      Subjective Assessment - 08/09/17 2135    Subjective The patient reports he is in 2/10 neck pain.  He notes HA this morning  on the R side of his head with a dizzy feeling.  He is doing HEP.  He estimates waking up 50% of the time with a headache.   He notes headaches have "reduced drastically".    Patient Stated Goals Ease muscle tension in right side.    Currently in Pain? Yes   Pain Score 2    Pain Location Neck   Pain Orientation Right   Pain Descriptors / Indicators Aching   Pain Type Chronic pain   Pain Onset More than a month ago   Pain Frequency Constant   Aggravating Factors  no known   Pain Relieving Factors no known                         [facility] Adult PT Treatment/Exercise - 08/09/17 2137      Self-Care   Self-Care Other Self-Care Comments   Other Self-Care Comments  Discussed sleeping positions and goal of keeping neck in neutral position.     Neck Exercises: Seated   Other Seated Exercise Seated on physioball performing scapular retraction with red theraband x 10 reps encouraging scapular depression, seated marching with arms abducted to 90 degrees and tactile cues for lengthening through spine, diagonal red theraband shoulder flexion with tactile cues x 5 reps.      Neck Exercises: Prone   Neck Retraction  5 reps   Neck Retraction Limitations prone on elbows   W Back 10 reps   W Back Limitations Tactile cues for muscle contraction   Shoulder Extension 10 reps   Shoulder Extension Limitations tactile cues for scapular depression/retraction.     Manual Therapy   Manual Therapy Joint mobilization;Soft tissue mobilization;Manual Traction   Manual therapy comments Joint mobilization for pain relief  and improved joint mobility.   Joint Mobilization PRONE:  mobilization with movement as patient performing prone push ups.  R>L lateral glide in sidelying and in prone Grade II-III.     Soft tissue mobilization R scalenes and upper trapezius musculature.    Manual Traction supine gentle manual traction                PT Education - 08/10/17 2134    Education provided Yes    Education Details HEP: added scalene/upper trapezius stretch, see printout   Person(s) Educated Patient   Methods Explanation;Demonstration;Handout   Comprehension Verbalized understanding;Returned demonstration          PT Short Term Goals - 08/09/17 2156      PT SHORT TERM GOAL #1   Title The patient will be indep with HEP for neck stabilization, postural strengthening, and stretching.   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   Time 4   Period Weeks   Status Achieved     PT SHORT TERM GOAL #3   Title The patient will report pain at worst improved from 8/10 down to 6/10 to demo improving self mgmt of pain   Baseline Still goes up to 8/10 at night *but not every night*   Time 4   Period Weeks   Status Not Met           PT Long Term Goals - 07/31/17 1119      PT LONG TERM GOAL #1   Title The patient will reduce neck disability index from 42% to < or equal to 30% to demo improving self perception of disability.   Time 8   Period Weeks     PT LONG TERM GOAL #2   Title The patient will improve neck AROM from 18 degrees L sidebending to 25 degrees L sidebending to demo improved flexibility R scalenes and upper trap.   Baseline L sidebending 30 degrees.   Time 8   Period Weeks   Status Achieved     PT LONG TERM GOAL #3   Title The patient will report resting neck pain 2/10 or less.   Baseline 0/10 at rest in the mornings.  Awaking 2 days in a row without headache (07/31/17)   Time 8   Period Weeks   Status Achieved     PT LONG TERM GOAL #4   Title The patient will be further assessed on neck flexion endurance test and goals to follow as indicated.   Baseline Patient holds 40 seconds.   Time 8   Period Weeks   Status Achieved               Plan - 08/09/17 2157    Clinical Impression Statement The patient notes  some pain with sleeping and PT address positioning today.  He notes significant decrease in frequency of HA and had decreased pain today with soft tissue  mobilization, manual techniques and ther ex.   PT Treatment/Interventions ADLs/Self Care Home Management;Neuromuscular re-education;Therapeutic activities;Therapeutic exercise;Manual techniques;Passive range of motion;Patient/family education;Moist Heat;Cryotherapy;Dry needling   PT Next Visit Plan deep neck flexor endurance, neural gliding, scapular depression, joint mobs/manip as indicated   Consulted and Agree with Plan of Care Patient      Patient will benefit from skilled therapeutic intervention in order to improve the following deficits and impairments:  Decreased strength, Postural dysfunction, Increased fascial restricitons, Decreased range of motion, Impaired flexibility, Hypomobility, Pain  Visit Diagnosis: Neck pain  Abnormal posture  Muscle weakness (generalized)     Problem List Patient Active Problem List   Diagnosis Date Noted  . Intractable chronic migraine without aura and with status migrainosus 06/06/2017  . Plantar fasciitis 09/26/2016  . Altered mental status 08/25/2015  . Tremor of right hand 08/25/2015  . Vision changes 08/25/2015  . Worsening headaches 08/25/2015  . New onset of headaches after age 3 08/25/2015  . Sinusitis, chronic 08/25/2015  . Hearing loss 08/25/2015  . Vertigo 08/25/2015  . Lap Nissen June 2013 05/24/2012  . GERD (gastroesophageal reflux disease) 03/28/2012  . Chronic headaches 12/22/2011  . HTN (hypertension) 12/22/2011  . High cholesterol 12/22/2011  . Depression 12/22/2011    Clancey Welton, PT 08/10/2017, 9:59 PM  [facility] [address] [place], [place], [address] Phone: [phone]   Fax:  [phone]  Name: TARUN PATCHELL MRN: [id number] Date of Birth: 21-Nov-1950

## 2017-08-14 ENCOUNTER — Ambulatory Visit: Payer: Medicare Other | Admitting: Rehabilitative and Restorative Service Providers"

## 2017-08-14 DIAGNOSIS — R293 Abnormal posture: Secondary | ICD-10-CM

## 2017-08-14 DIAGNOSIS — M542 Cervicalgia: Secondary | ICD-10-CM | POA: Diagnosis not present

## 2017-08-14 DIAGNOSIS — M6281 Muscle weakness (generalized): Secondary | ICD-10-CM

## 2017-08-15 NOTE — Therapy (Signed)
[facility] [address] [place], [place], [address] Phone: [phone]   Fax:  [phone]  Physical Therapy Treatment  Patient Details  Name: Mitchell Baxter MRN: [id number] Date of Birth: 11-Jul-1951 Referring Provider: Heide Spark, MD  Encounter Date: 08/14/2017      PT End of Session - 08/14/17 1329    Visit Number 8   Number of Visits 12   Date for PT Re-Evaluation 09/15/17   Authorization Type G code every 10th visit   PT Start Time 0848   PT Stop Time 0930   PT Time Calculation (min) 42 min   Activity Tolerance Patient tolerated treatment well;No increased pain   Behavior During Therapy WFL for tasks assessed/performed      Past Medical History:  Diagnosis Date  . COPD (chronic obstructive pulmonary disease) ([place])   . Depression   . GERD (gastroesophageal reflux disease)   . Headache(784.0)    migraine  . Hyperlipidemia   . Hypertension   . Shortness of breath    exertion  . Sleep apnea    stopbang=4  . Vertigo     Past Surgical History:  Procedure Laterality Date  . BRAVO [place] STUDY  11/29/2011   Procedure: BRAVO [place];  Surgeon: Landry Dyke, MD;  [facility];  Service: Endoscopy;  Laterality: N/A;  . COLONOSCOPY  10/03/2012   Procedure: COLONOSCOPY;  Surgeon: Shann Medal, MD;  Location: [facility];  Service: General;  Laterality: N/A;  carol/leone  . HIATAL HERNIA REPAIR  04/16/2012   Procedure: LAPAROSCOPIC REPAIR OF HIATAL HERNIA;  Surgeon: Pedro Earls, MD;  [facility]: [facility];  Service: General;;  . LAPAROSCOPIC NISSEN FUNDOPLICATION  9/75/8832   Procedure: LAPAROSCOPIC NISSEN FUNDOPLICATION;  Surgeon: Pedro Earls, MD;  [facility]: [facility];  Service: General;  Laterality: N/A;  . none      There were no vitals filed for this visit.      Subjective Assessment - 08/14/17 0852    Subjective "I didn't do my exercises last night because I had a spell."  PT  inquired further and he describes a room spinning sensation lasting 3 minutes when sitting still watching television.  He denied HA, reports he used to get these 1x/week, but they have been less frequent now.   He denies headache or neck pain.   Neck pain is occurring later in the day.    Patient Stated Goals Ease muscle tension in right side.    Currently in Pain? No/denies            [facility] PT Assessment - 08/14/17 0859      AROM   Cervical Flexion 40   Cervical Extension 56   Cervical - Right Side Bend 40   Cervical - Left Side Bend 30  tightness in R upper trap noted   Cervical - Right Rotation 75   Cervical - Left Rotation 75            Vestibular Assessment - 08/14/17 0855      Vestibular Assessment   General Observation No dizziness noted today.      Positional Testing   Dix-Hallpike Dix-Hallpike Right;Dix-Hallpike Left   Horizontal Canal Testing Horizontal Canal Right;Horizontal Canal Left     Dix-Hallpike Right   Dix-Hallpike Right Duration 0   Dix-Hallpike Right Symptoms No nystagmus     Dix-Hallpike Left   Dix-Hallpike Left Duration 0   Dix-Hallpike Left Symptoms No nystagmus  Horizontal Canal Right   Horizontal Canal Right Duration 0   Horizontal Canal Right Symptoms Normal     Horizontal Canal Left   Horizontal Canal Left Duration 0   Horizontal Canal Left Symptoms Normal                 [facility] Adult PT Treatment/Exercise - 08/14/17 0904      Exercises   Exercises Other Exercises   Other Exercises  SUPINE:  chin tucks/nods x 10 reps with 5 second holds, SEATED: physioball postural stabilization, seated marching with cues to depress scapulae x 10 reps, "W" on ball x 10 reps, shoulder abduction x 10 reps emphasizing scapular stabilization.  STANDING:  wall push ups x 10 reps, standing door frame stretch.  PRONE:  on elbows with press ups, prone planks x 5 second holds x 10 reps.      Manual Therapy   Manual Therapy Soft tissue  mobilization;Scapular mobilization;Muscle Energy Technique;Joint mobilization   Joint Mobilization PRONE:  mobilization with movement as patient performing prone push ups.   Soft tissue mobilization R scalene trigger point release   Scapular Mobilization Sidelying scapular mobility   Manual Traction supine gentle manual traction                  PT Short Term Goals - 08/09/17 2156      PT SHORT TERM GOAL #1   Title The patient will be indep with HEP for neck stabilization, postural strengthening, and stretching.   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   Time 4   Period Weeks   Status Achieved     PT SHORT TERM GOAL #3   Title The patient will report pain at worst improved from 8/10 down to 6/10 to demo improving self mgmt of pain   Baseline Still goes up to 8/10 at night *but not every night*   Time 4   Period Weeks   Status Not Met           PT Long Term Goals - 07/31/17 1119      PT LONG TERM GOAL #1   Title The patient will reduce neck disability index from 42% to < or equal to 30% to demo improving self perception of disability.   Time 8   Period Weeks     PT LONG TERM GOAL #2   Title The patient will improve neck AROM from 18 degrees L sidebending to 25 degrees L sidebending to demo improved flexibility R scalenes and upper trap.   Baseline L sidebending 30 degrees.   Time 8   Period Weeks   Status Achieved     PT LONG TERM GOAL #3   Title The patient will report resting neck pain 2/10 or less.   Baseline 0/10 at rest in the mornings.  Awaking 2 days in a row without headache (07/31/17)   Time 8   Period Weeks   Status Achieved     PT LONG TERM GOAL #4   Title The patient will be further assessed on neck flexion endurance test and goals to follow as indicated.   Baseline Patient holds 40 seconds.   Time 8    Period Weeks   Status Achieved  Plan - 08/14/17 0902    Clinical Impression Statement The patient notes pain worse with fatigue.  PT recommending he continue working on HEP for strengthening and as muscle strength improves, the pain associated with fatigue may decrease.  We also discussed ongoing self mgmt of pain.   PT Treatment/Interventions ADLs/Self Care Home Management;Neuromuscular re-education;Therapeutic activities;Therapeutic exercise;Manual techniques;Passive range of motion;Patient/family education;Moist Heat;Cryotherapy;Dry needling   PT Next Visit Plan Check LTGs, determine d/c versus continue PT   Consulted and Agree with Plan of Care Patient      Patient will benefit from skilled therapeutic intervention in order to improve the following deficits and impairments:  Decreased strength, Postural dysfunction, Increased fascial restricitons, Decreased range of motion, Impaired flexibility, Hypomobility, Pain  Visit Diagnosis: Neck pain  Abnormal posture  Muscle weakness (generalized)     Problem List Patient Active Problem List   Diagnosis Date Noted  . Intractable chronic migraine without aura and with status migrainosus 06/06/2017  . Plantar fasciitis 09/26/2016  . Altered mental status 08/25/2015  . Tremor of right hand 08/25/2015  . Vision changes 08/25/2015  . Worsening headaches 08/25/2015  . New onset of headaches after age 33 08/25/2015  . Sinusitis, chronic 08/25/2015  . Hearing loss 08/25/2015  . Vertigo 08/25/2015  . Lap [place]issen June 2013 05/24/2012  . GERD (gastroesophageal reflux disease) 03/28/2012  . Chronic headaches 12/22/2011  . HTN (hypertension) 12/22/2011  . High cholesterol 12/22/2011  . Depression 12/22/2011    Jamaia Brum , PT 08/15/2017, 9:04 AM  [facility] [address] [place], [place], [address] Phone: [phone]   Fax:  [phone]  Name:  Mitchell Baxter MRN: [id number] Date of Birth: 1951-08-01

## 2017-08-21 ENCOUNTER — Telehealth: Payer: Self-pay | Admitting: Rehabilitative and Restorative Service Providers"

## 2017-08-21 ENCOUNTER — Ambulatory Visit: Payer: Medicare Other | Admitting: Rehabilitative and Restorative Service Providers"

## 2017-08-21 NOTE — Telephone Encounter (Signed)
PT called patient due to missed appt.  Rescheduled to 10/30 @ 11am. Patient notes 0/10 pain.  [name], PT

## 2017-08-28 ENCOUNTER — Ambulatory Visit: Payer: Medicare Other | Admitting: Rehabilitative and Restorative Service Providers"

## 2017-08-28 DIAGNOSIS — M6281 Muscle weakness (generalized): Secondary | ICD-10-CM | POA: Diagnosis not present

## 2017-08-28 DIAGNOSIS — M542 Cervicalgia: Secondary | ICD-10-CM

## 2017-08-28 DIAGNOSIS — R293 Abnormal posture: Secondary | ICD-10-CM | POA: Diagnosis not present

## 2017-08-28 NOTE — Therapy (Signed)
[facility] [address] [place], [place], [address] Phone: [phone]   Fax:  [phone]  Physical Therapy Treatment and Discharge Summary  Patient Details  Name: Mitchell Baxter MRN: [id number] Date of Birth: 20-Aug-1951 Referring Provider: Heide Spark, MD  Encounter Date: 08/28/2017      PT End of Session - 08/28/17 1128    Visit Number 9   Number of Visits 12   Date for PT Re-Evaluation 09/15/17   Authorization Type G code every 10th visit   PT Start Time 1110   PT Stop Time 1138   PT Time Calculation (min) 28 min   Activity Tolerance Patient tolerated treatment well;No increased pain   Behavior During Therapy WFL for tasks assessed/performed      Past Medical History:  Diagnosis Date  . COPD (chronic obstructive pulmonary disease) ([place])   . Depression   . GERD (gastroesophageal reflux disease)   . Headache(784.0)    migraine  . Hyperlipidemia   . Hypertension   . Shortness of breath    exertion  . Sleep apnea    stopbang=4  . Vertigo     Past Surgical History:  Procedure Laterality Date  . BRAVO [place] STUDY  11/29/2011   Procedure: BRAVO [place];  Surgeon: Landry Dyke, MD;  [facility];  Service: Endoscopy;  Laterality: N/A;  . COLONOSCOPY  10/03/2012   Procedure: COLONOSCOPY;  Surgeon: Shann Medal, MD;  Location: [facility];  Service: General;  Laterality: N/A;  carol/leone  . HIATAL HERNIA REPAIR  04/16/2012   Procedure: LAPAROSCOPIC REPAIR OF HIATAL HERNIA;  Surgeon: Pedro Earls, MD;  [facility];  Service: General;;  . LAPAROSCOPIC NISSEN FUNDOPLICATION  7/37/1062   Procedure: LAPAROSCOPIC NISSEN FUNDOPLICATION;  Surgeon: Pedro Earls, MD;  Location: [facility];  Service: General;  Laterality: N/A;  . none      There were no vitals filed for this visit.      Subjective Assessment - 08/28/17 1112    Subjective The patient notes his neck is feeling better,  although he is not doing exercises often.  He had dental surgery Friday and is still sore -- he notes he has had mouth pain.  He feels his head is not bothering him as much.    Patient Stated Goals Ease muscle tension in right side.    Currently in Pain? Yes   Pain Score 1    Pain Location Neck   Pain Orientation Right   Pain Descriptors / Indicators Aching   Pain Type Chronic pain   Pain Onset More than a month ago   Pain Frequency Constant   Aggravating Factors  no known   Pain Relieving Factors no known            [facility] PT Assessment - 08/28/17 1138      Observation/Other Assessments   Focus on Therapeutic Outcomes (FOTO)  96%   Neck Disability Index  4%                     [facility] Adult PT Treatment/Exercise - 08/28/17 1115      Self-Care   Self-Care Other Self-Care Comments   Other Self-Care Comments  Discussed stress mgmt as patient struggles at times to find time to exercise or walk.  PT discussed benefits of exercise to manage stress.  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  PT encouraged exercise to help with stress mgmt.      Exercises   Exercises Other Exercises   Other Exercises  Reviewed seated stretching exercises, performed seated chin tucks, ROM rotation with flexion/extension.  Discussed using HEP throughout the day to improve mobility.                    PT Short Term Goals - 08/09/17 2156      PT SHORT TERM GOAL #1   Title The patient will be indep with HEP for neck stabilization, postural strengthening, and stretching.   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   Time 4   Period Weeks   Status Achieved     PT SHORT TERM GOAL #3   Title The patient will report pain at worst  improved from 8/10 down to 6/10 to demo improving self mgmt of pain   Baseline Still goes up to 8/10 at night *but not every night*   Time 4   Period Weeks   Status Not Met           PT Long Term Goals - 08/28/17 1133      PT LONG TERM GOAL #1   Title The patient will reduce neck disability index from 42% to < or equal to 30% to demo improving self perception of disability.   Baseline Improved from 42% to 4%.   Time 8   Period Weeks   Status Achieved     PT LONG TERM GOAL #2   Title The patient will improve neck AROM from 18 degrees L sidebending to 25 degrees L sidebending to demo improved flexibility R scalenes and upper trap.   Baseline L sidebending 30 degrees.   Time 8   Period Weeks   Status Achieved     PT LONG TERM GOAL #3   Title The patient will report resting neck pain 2/10 or less.   Baseline 0/10 at rest in the mornings.  Awaking 2 days in a row without headache (07/31/17)   Time 8   Period Weeks   Status Achieved     PT LONG TERM GOAL #4   Title The patient will be further assessed on neck flexion endurance test and goals to follow as indicated.   Baseline Patient holds 40 seconds.   Time 8   Period Weeks   Status Achieved               Plan - 08/28/17 1133    Clinical Impression Statement The patient met all LTGs.  He notes in past 2 weeks 4/10 is the highest pain level he has gotten at night (significantly improved) and that was only 2 nights.   He is not getting headaches at this time and notes 1/10 neck pain.     PT Treatment/Interventions ADLs/Self Care Home Management;Neuromuscular re-education;Therapeutic activities;Therapeutic exercise;Manual techniques;Passive range of motion;Patient/family education;Moist Heat;Cryotherapy;Dry needling   PT Next Visit Plan Discharge.    Consulted and Agree with Plan of Care Patient      Patient will benefit from skilled therapeutic intervention in order to improve the following deficits and impairments:   Decreased strength, Postural dysfunction, Increased fascial restricitons, Decreased range of motion, Impaired flexibility, Hypomobility, Pain  Visit Diagnosis: No diagnosis  found.    PHYSICAL THERAPY DISCHARGE SUMMARY  Visits from Start of Care: 9  Current functional level related to goals / functional outcomes: See above- met all LTGs.   Remaining deficits: Intermittent neck tightness   Education / Equipment: HEP, general wellness.  Plan: Patient agrees to discharge.  Patient goals were met. Patient is being discharged due to meeting the stated rehab goals.  ?????        Thank you for the referral of this patient. Rudell Cobb, MPT  [place], PT 08/28/2017, 11:42 AM  [facility] [address] [place], [place], [address] Phone: [phone]   Fax:  [phone]  Name: Mitchell Baxter MRN: [id number] Date of Birth: August 02, 1951

## 2017-09-14 ENCOUNTER — Other Ambulatory Visit: Payer: Self-pay | Admitting: Internal Medicine

## 2017-09-14 DIAGNOSIS — R109 Unspecified abdominal pain: Secondary | ICD-10-CM | POA: Diagnosis not present

## 2017-09-24 ENCOUNTER — Ambulatory Visit
Admission: RE | Admit: 2017-09-24 | Discharge: 2017-09-24 | Disposition: A | Discharge status: Home / Self Care | Payer: Medicare Other | Source: Ambulatory Visit | Attending: Internal Medicine | Admitting: Internal Medicine

## 2017-09-24 DIAGNOSIS — R109 Unspecified abdominal pain: Secondary | ICD-10-CM

## 2017-09-24 DIAGNOSIS — R1030 Lower abdominal pain, unspecified: Secondary | ICD-10-CM | POA: Diagnosis not present

## 2017-09-24 MED ORDER — IOPAMIDOL (ISOVUE-300) INJECTION 61%
125.0000 mL | Freq: Once | INTRAVENOUS | Status: AC | PRN
  Administered 2017-09-24: 125 mL via INTRAVENOUS

## 2017-09-25 ENCOUNTER — Other Ambulatory Visit: Payer: Self-pay | Admitting: Internal Medicine

## 2017-09-25 DIAGNOSIS — Z23 Encounter for immunization: Secondary | ICD-10-CM | POA: Diagnosis not present

## 2017-09-25 DIAGNOSIS — R1030 Lower abdominal pain, unspecified: Secondary | ICD-10-CM | POA: Diagnosis not present

## 2017-10-03 DIAGNOSIS — R194 Change in bowel habit: Secondary | ICD-10-CM | POA: Diagnosis not present

## 2017-10-03 DIAGNOSIS — R1032 Left lower quadrant pain: Secondary | ICD-10-CM | POA: Diagnosis not present

## 2017-10-03 DIAGNOSIS — K5900 Constipation, unspecified: Secondary | ICD-10-CM | POA: Diagnosis not present

## 2017-10-09 ENCOUNTER — Inpatient Hospital Stay
Admission: RE | Admit: 2017-10-09 | Discharge: 2017-10-09 | Disposition: A | Discharge status: Home / Self Care | Payer: Medicare Other | Source: Ambulatory Visit | Attending: Internal Medicine | Admitting: Internal Medicine

## 2017-10-11 ENCOUNTER — Telehealth: Payer: Self-pay | Admitting: *Deleted

## 2017-10-11 ENCOUNTER — Ambulatory Visit: Payer: Medicare Other | Admitting: Neurology

## 2017-10-11 NOTE — Telephone Encounter (Signed)
Patient no show botox appt on 10/11/2017 @ 4:00.

## 2017-10-12 ENCOUNTER — Telehealth: Payer: Self-pay | Admitting: Neurology

## 2017-10-15 NOTE — Telephone Encounter (Signed)
No additional notes

## 2017-10-16 DIAGNOSIS — R194 Change in bowel habit: Secondary | ICD-10-CM | POA: Diagnosis not present

## 2017-10-16 DIAGNOSIS — D123 Benign neoplasm of transverse colon: Secondary | ICD-10-CM | POA: Diagnosis not present

## 2017-10-16 DIAGNOSIS — K635 Polyp of colon: Secondary | ICD-10-CM | POA: Diagnosis not present

## 2017-10-16 DIAGNOSIS — R1032 Left lower quadrant pain: Secondary | ICD-10-CM | POA: Diagnosis not present

## 2017-10-16 NOTE — Telephone Encounter (Signed)
botox r/s to 1/14.Marland Kitchen FYI

## 2017-10-17 NOTE — Telephone Encounter (Signed)
Noted, thank you

## 2017-10-18 ENCOUNTER — Encounter: Payer: Self-pay | Admitting: Neurology

## 2017-10-24 ENCOUNTER — Telehealth: Payer: Self-pay | Admitting: Neurology

## 2017-10-24 ENCOUNTER — Other Ambulatory Visit: Payer: Self-pay | Admitting: Neurology

## 2017-10-24 NOTE — Telephone Encounter (Signed)
I can't find where qudexy has been prescribed for this pt. It also appears that pt no-showed his last botox appt.

## 2017-10-24 NOTE — Telephone Encounter (Signed)
Call regarding qudexy

## 2017-10-24 NOTE — Telephone Encounter (Signed)
He showed up late for his appointment. I will prescribe Qudexy for him, thanks

## 2017-10-24 NOTE — Telephone Encounter (Signed)
Pt wife(on DPR) has called and would like to know if since pt has run out of the samples for Qudexy can pt get more samples until appointment, please call

## 2017-10-25 ENCOUNTER — Encounter: Payer: Self-pay | Admitting: Neurology

## 2017-10-25 ENCOUNTER — Telehealth: Payer: Self-pay | Admitting: Neurology

## 2017-10-25 ENCOUNTER — Ambulatory Visit (INDEPENDENT_AMBULATORY_CARE_PROVIDER_SITE_OTHER): Payer: Medicare Other | Admitting: Neurology

## 2017-10-25 VITALS — BP 129/76 | HR 82 | Ht 72.0 in | Wt 227.6 lb

## 2017-10-25 DIAGNOSIS — G43711 Chronic migraine without aura, intractable, with status migrainosus: Secondary | ICD-10-CM

## 2017-10-25 MED ORDER — FREMANEZUMAB-VFRM 225 MG/1.5ML ~~LOC~~ SOSY: 225.0000 mg | PREFILLED_SYRINGE | Freq: Once | SUBCUTANEOUS | Status: DC

## 2017-10-25 NOTE — Progress Notes (Signed)

## 2017-10-25 NOTE — Telephone Encounter (Signed)
Pt. Needs 12 wk Botox.

## 2017-10-25 NOTE — Progress Notes (Signed)
Botox-100unitsx2 vials Lot: [id number] Expiration: 03/2020 NDC: 7615-1834-37 [id number]  0.9% Sodium Chloride- 10mL total Lot: [id number] Expiration: 04/2019 NDC: 13887-195-97  Dx: I71.855 B/B

## 2017-10-25 NOTE — Telephone Encounter (Signed)
I called pt, spoke to pt's wife, per DPR. She reports that pt is taking qudexy 100mg  tablets 2 tablets every evening. Pt is doing well on it. I asked pt's wife if they would like to come in to for an appt today at 4pm for botox. Pt's wife is agreeable to this and I will place him on the schedule. Pt's wife verbalized understanding.

## 2017-10-25 NOTE — Telephone Encounter (Signed)
Ask him what dose of Qudexy he is on and I will leave samples. Also he missed his last botox, he is welcome to come in today if he wants it.

## 2017-11-12 ENCOUNTER — Ambulatory Visit: Payer: Medicare Other | Admitting: Neurology

## 2017-11-12 ENCOUNTER — Telehealth: Payer: Self-pay | Admitting: *Deleted

## 2017-11-12 NOTE — Telephone Encounter (Signed)
Error

## 2017-11-14 DIAGNOSIS — K5900 Constipation, unspecified: Secondary | ICD-10-CM | POA: Diagnosis not present

## 2017-11-14 DIAGNOSIS — R1032 Left lower quadrant pain: Secondary | ICD-10-CM | POA: Diagnosis not present

## 2018-01-07 ENCOUNTER — Telehealth: Payer: Self-pay | Admitting: Neurology

## 2018-01-07 NOTE — Telephone Encounter (Signed)
botox appt scheduled for 4/1

## 2018-01-07 NOTE — Telephone Encounter (Signed)
Noted! Thank you

## 2018-01-28 ENCOUNTER — Encounter: Payer: Self-pay | Admitting: Neurology

## 2018-01-28 ENCOUNTER — Encounter (INDEPENDENT_AMBULATORY_CARE_PROVIDER_SITE_OTHER): Payer: Self-pay

## 2018-01-28 ENCOUNTER — Ambulatory Visit (INDEPENDENT_AMBULATORY_CARE_PROVIDER_SITE_OTHER): Payer: Medicare Other | Admitting: Neurology

## 2018-01-28 VITALS — BP 129/82 | HR 74

## 2018-01-28 DIAGNOSIS — G43711 Chronic migraine without aura, intractable, with status migrainosus: Secondary | ICD-10-CM

## 2018-01-28 MED ORDER — KETOROLAC TROMETHAMINE 60 MG/2ML IM SOLN
30.0000 mg | Freq: Once | INTRAMUSCULAR | Status: AC
  Administered 2018-01-28: 30 mg via INTRAMUSCULAR

## 2018-01-28 NOTE — Progress Notes (Signed)
keBotox- 100 units x 2 vials UJW:[id number] Expiration: 06/2020 NDC: 2956-2130-86  Bacteriostatic 0.9% Sodium Chloride- 32mL total Lot: [id number] Expiration: 02/28/2019 NDC: 6295-2841-32  Dx: G40.102 B/B  Toradol 30 mg IM injection given to pt. Pt tolerated well. Bandaid applied.  //BCrn

## 2018-01-28 NOTE — Progress Notes (Signed)
Was having daily headaches and now 25 free headache days, exceptional response. +right occipital area.   Consent Form Botulism Toxin Injection For Chronic Migraine  Botulism toxin has been approved by the Federal drug administration for treatment of chronic migraine. Botulism toxin does not cure chronic migraine and it may not be effective in some patients.  The administration of botulism toxin is accomplished by injecting a small amount of toxin into the muscles of the neck and head. Dosage must be titrated for each individual. Any benefits resulting from botulism toxin tend to wear off after 3 months with a repeat injection required if benefit is to be maintained. Injections are usually done every 3-4 months with maximum effect peak achieved by about 2 or 3 weeks. Botulism toxin is expensive and you should be sure of what costs you will incur resulting from the injection.  The side effects of botulism toxin use for chronic migraine may include:   -Transient, and usually mild, facial weakness with facial injections  -Transient, and usually mild, head or neck weakness with head/neck injections  -Reduction or loss of forehead facial animation due to forehead muscle              weakness  -Eyelid drooping  -Dry eye  -Pain at the site of injection or bruising at the site of injection  -Double vision  -Potential unknown long term risks  Contraindications: You should not have Botox if you are pregnant, nursing, allergic to albumin, have an infection, skin condition, or muscle weakness at the site of the injection, or have myasthenia gravis, Lambert-Eaton syndrome, or ALS.  It is also possible that as with any injection, there may be an allergic reaction or no effect from the medication. Reduced effectiveness after repeated injections is sometimes seen and rarely infection at the injection site may occur. All care will be taken to prevent these side effects. If therapy is given over a long time,  atrophy and wasting in the muscle injected may occur. Occasionally the patient's become refractory to treatment because they develop antibodies to the toxin. In this event, therapy needs to be modified.  I have read the above information and consent to the administration of botulism toxin.    ______________  _____   _________________  Patient signature     Date   Witness signature       BOTOX PROCEDURE NOTE FOR MIGRAINE HEADACHE    Contraindications and precautions discussed with patient(above). Aseptic procedure was observed and patient tolerated procedure. Procedure performed by Dr. Georgia Dom  The condition has existed for more than 6 months, and pt does not have a diagnosis of ALS, Myasthenia Gravis or Lambert-Eaton Syndrome. Risks and benefits of injections discussed and pt agrees to proceed with the procedure. Written consent obtained  These injections are medically necessary. He receives good benefits from these injections. These injections do not cause sedations or hallucinations which the oral therapies may cause.  Indication/Diagnosis: chronic migraine BOTOX(J0585) injection was performed according to protocol by [employer]. 200 units of BOTOX was dissolved into 4 cc NS.  NDC: [address]-24   Buy and bill  100 units x 2  [id number] 03/2020   Description of procedure:  The patient was placed in a sitting position. The standard protocol was used for Botox as follows, with 5 units of Botox injected at each site:   -Procerus muscle, midline injection  -Corrugator muscle, bilateral injection  -Frontalis muscle, bilateral injection, with 2 sites each side, medial injection was  performed in the upper one third of the frontalis muscle, in the region vertical from the medial inferior edge of the superior orbital rim. The lateral injection was again in the upper one third of the forehead vertically above the lateral limbus of the cornea, 1.5 cm lateral to the medial  injection site.  -Temporalis muscle injection, 4 sites, bilaterally. The first injection was 3 cm above the tragus of the ear, second injection site was 1.5 cm to 3 cm up from the first injection site in line with the tragus of the ear. The third injection site was 1.5-3 cm forward between the first 2 injection sites. The fourth injection site was 1.5 cm posterior to the second injection site.  -Occipitalis muscle injection, 3 sites, bilaterally. The first injection was done one half way between the occipital protuberance and the tip of the mastoid process behind the ear. The second injection site was done lateral and superior to the first, 1 fingerbreadth from the first injection. The third injection site was 1 fingerbreadth superiorly and medially from the first injection site.  -Cervical paraspinal muscle injection, 2 sites, bilateral knee first injection site was 1 cm from the midline of the cervical spine, 3 cm inferior to the lower border of the occipital protuberance. The second injection site was 1.5 cm superiorly and laterally to the first injection site.  -Trapezius muscle injection was performed at 3 sites, bilaterally. The first injection site was in the upper trapezius muscle halfway between the inflection point of the neck, and the acromion. The second injection site was one half way between the acromion and the first injection site. The third injection was done between the first injection site and the inflection point of the neck.   Will return for repeat injection in 3 months.   A 200 unit sof Botox was used, 155 units were injected, the rest of the Botox was wasted. The patient tolerated the procedure well, there were no complications of the above procedure.

## 2018-02-05 ENCOUNTER — Telehealth: Payer: Self-pay | Admitting: Neurology

## 2018-02-05 NOTE — Telephone Encounter (Signed)
Pt wife(on DPR) has called to inform that pt rubbed his forehead and now he has a swollen eye for a couple of days.  Please call

## 2018-02-05 NOTE — Telephone Encounter (Addendum)
Per Dr. Jaynee Eagles, this should resolve within a couple of days. However if any concern for infection call office back.    Spoke with pt's wife. She said that in his right eye, the upper eyelid in the corner is swollen and then under his eye it is swollen. She said the corner of his eye is red but she did seem to be concerned of infection. RN informed her that it should go away within a couple of days or so, however if there is concern of infection or any problems with vision, call us back. She verbalized understanding and appreciation.

## 2018-02-08 IMAGING — CT CT ABD-PELV W/ CM
1 of 3 series · 14 of 32 positions shown, 19 images · IV contrast (APPLIED)
Comparison: 06/13/2013

CLINICAL DATA: Constipation, lower abdominal pain

EXAM:
CT ABDOMEN AND PELVIS WITH CONTRAST
TECHNIQUE: Multidetector CT imaging of the abdomen and pelvis was performed
using the standard protocol following bolus administration of
intravenous contrast.
CONTRAST:  125mL AO5WZ8-AAA IOPAMIDOL (AO5WZ8-AAA) INJECTION 61%

[Series 2: abd/pelvis w/cm · axial · 0.86mm/px · z∈[-516,-56]mm · 14 of 104 slices shown, 19 images]
[im 6/104  soft-tissue]
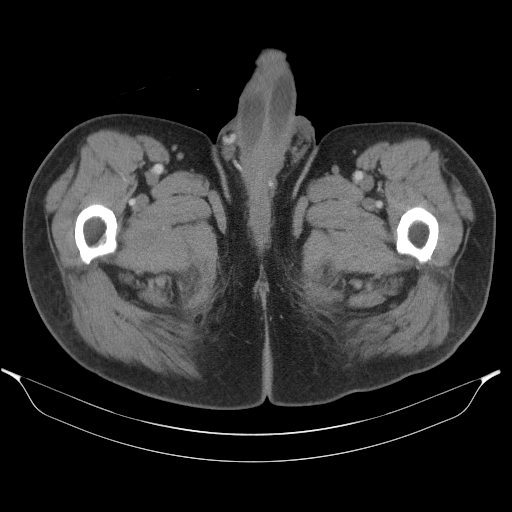
[im 6/104  bone]
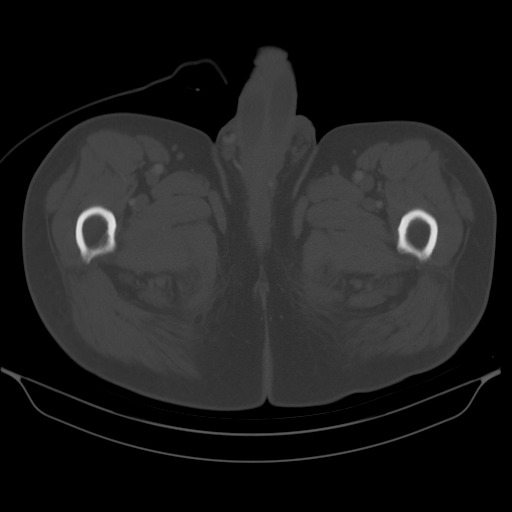
[im 16/104  soft-tissue]
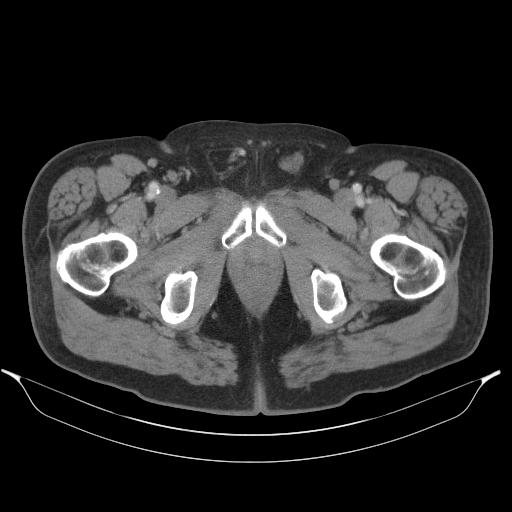
[im 21/104  soft-tissue]
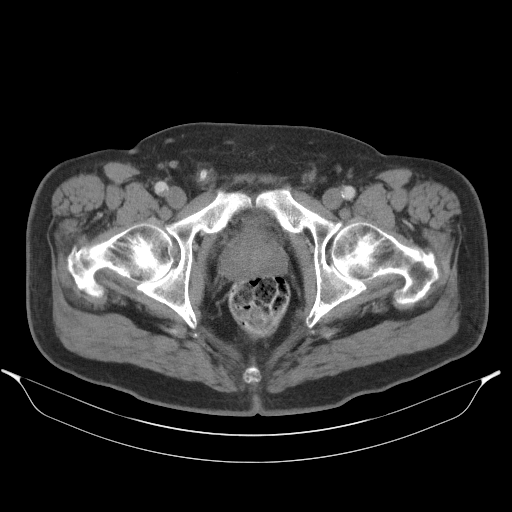
[im 31/104  soft-tissue]
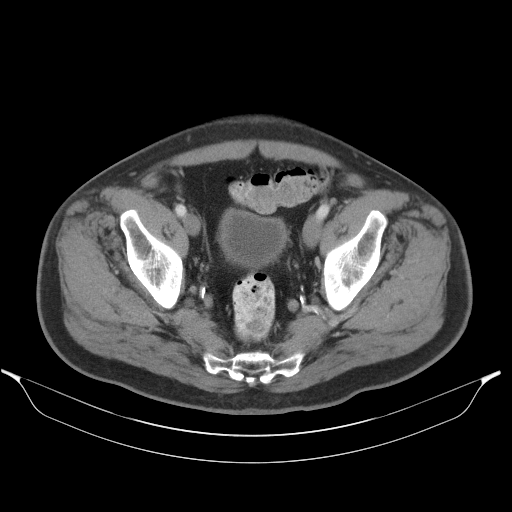
[im 37/104  soft-tissue]
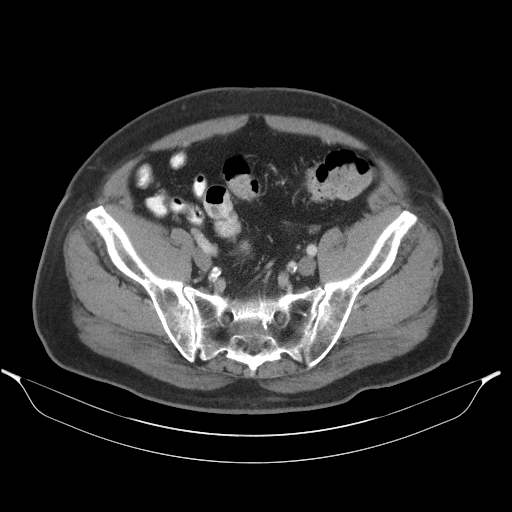
[im 47/104  soft-tissue]
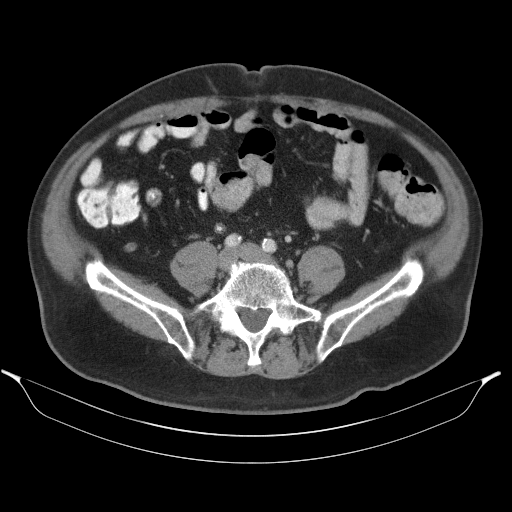
[im 52/104  soft-tissue]
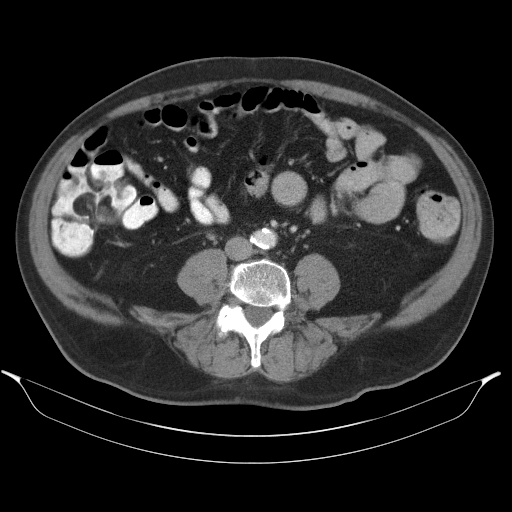
[im 57/104  soft-tissue]
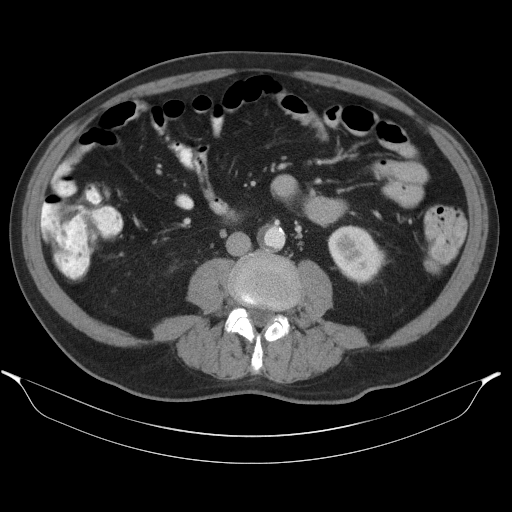
[im 67/104  soft-tissue]
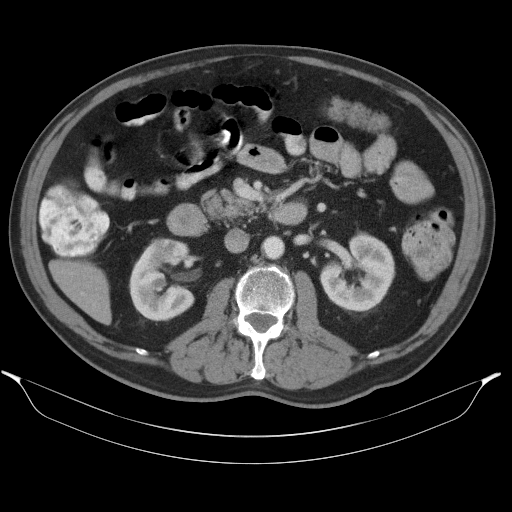
[im 67/104  bone]
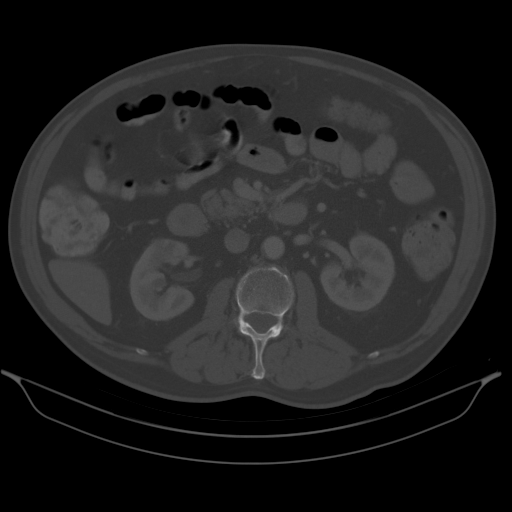
[im 73/104  soft-tissue]
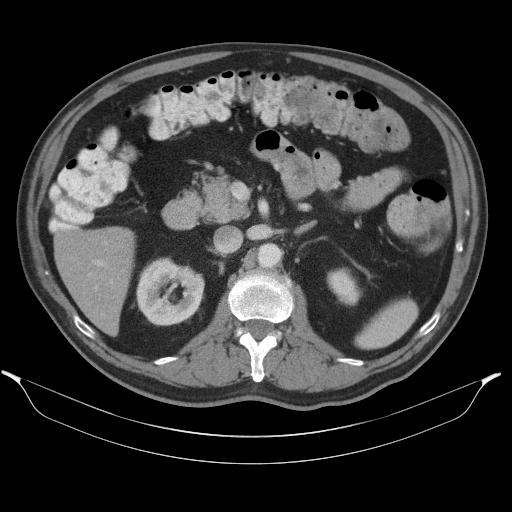
[im 83/104  soft-tissue]
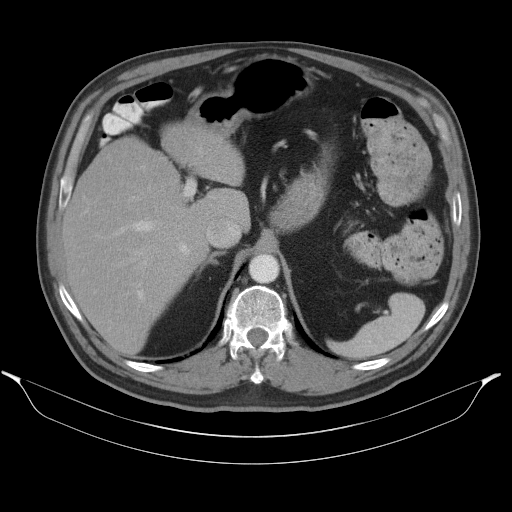
[im 83/104  lung]
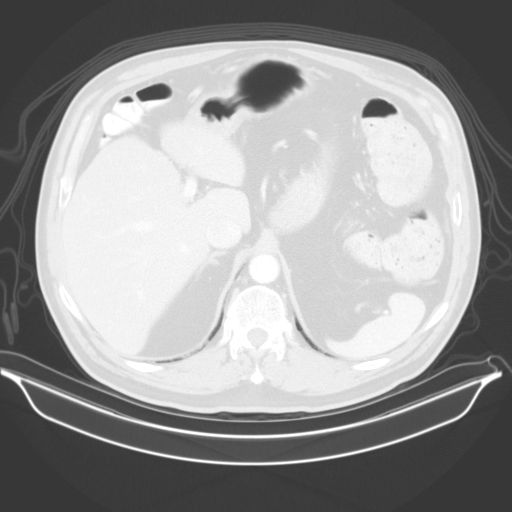
[im 88/104  soft-tissue]
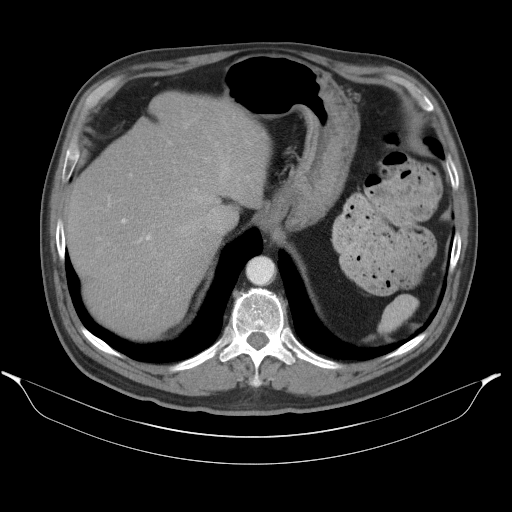
[im 88/104  lung]
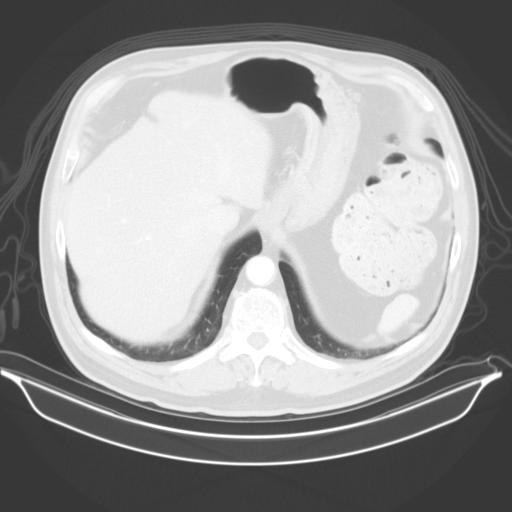
[im 93/104  lung]
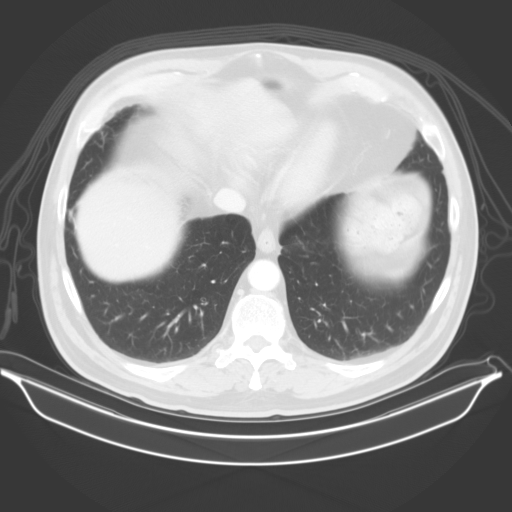
[im 98/104  soft-tissue]
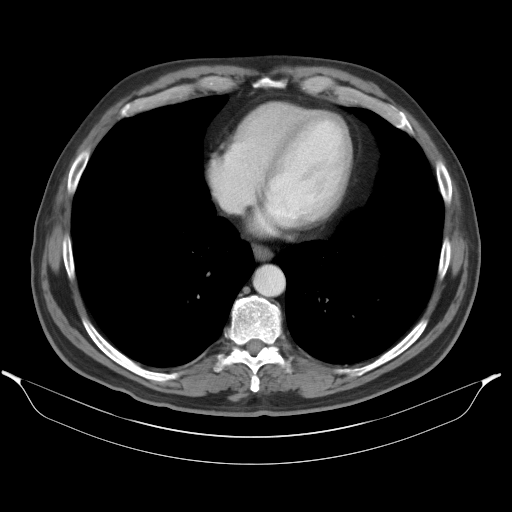
[im 98/104  lung]
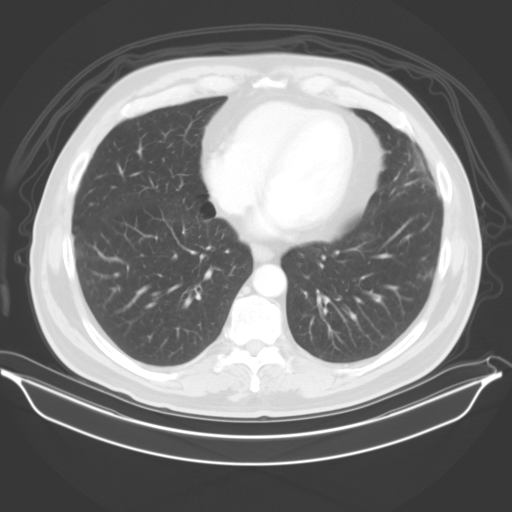

[14 of 32 positions shown; findings below may reference images not displayed]

FINDINGS: Lower chest: Lung bases are clear. No effusions. Heart is normal
size.

Hepatobiliary: No focal hepatic abnormality. Gallbladder
unremarkable.

Pancreas: No focal abnormality or ductal dilatation.

Spleen: No focal abnormality.  Normal size.

Adrenals/Urinary Tract: Small bilateral renal cortical cysts. No
hydronephrosis or suspicious mass. Adrenal glands and urinary
bladder unremarkable.

Stomach/Bowel: Moderate stool burden throughout the colon. Normal
appendix. Stomach, large and small bowel grossly unremarkable.

Vascular/Lymphatic: Diffuse aortic and iliac calcifications. No
aneurysm or adenopathy.

Reproductive: Mild prostate enlargement.

Other: No free fluid or free air.

Musculoskeletal: No acute bony abnormality.
IMPRESSION: No acute findings in the abdomen or pelvis. Moderate stool burden
throughout the colon.

Aortoiliac atherosclerosis.

## 2018-05-06 ENCOUNTER — Ambulatory Visit (INDEPENDENT_AMBULATORY_CARE_PROVIDER_SITE_OTHER): Payer: Medicare Other | Admitting: Neurology

## 2018-05-06 VITALS — BP 124/73 | HR 73 | Ht 72.0 in | Wt 219.8 lb

## 2018-05-06 DIAGNOSIS — G43711 Chronic migraine without aura, intractable, with status migrainosus: Secondary | ICD-10-CM

## 2018-05-06 MED ORDER — ERENUMAB-AOOE 140 MG/ML ~~LOC~~ SOAJ: 140.0000 mg | SUBCUTANEOUS | 11 refills | Status: DC

## 2018-05-06 NOTE — Progress Notes (Signed)
Was having daily headaches and now 15 headache days with 8 migrainous, good response(>50% improvement with botox) but still with chronic migraines will introduce Aimovig feel he will have even better response with cgrp WITh the botox.. +right occipital area.   Consent Form Botulism Toxin Injection For Chronic Migraine  Botulism toxin has been approved by the Federal drug administration for treatment of chronic migraine. Botulism toxin does not cure chronic migraine and it may not be effective in some patients.  The administration of botulism toxin is accomplished by injecting a small amount of toxin into the muscles of the neck and head. Dosage must be titrated for each individual. Any benefits resulting from botulism toxin tend to wear off after 3 months with a repeat injection required if benefit is to be maintained. Injections are usually done every 3-4 months with maximum effect peak achieved by about 2 or 3 weeks. Botulism toxin is expensive and you should be sure of what costs you will incur resulting from the injection.  The side effects of botulism toxin use for chronic migraine may include:   -Transient, and usually mild, facial weakness with facial injections  -Transient, and usually mild, head or neck weakness with head/neck injections  -Reduction or loss of forehead facial animation due to forehead muscle              weakness  -Eyelid drooping  -Dry eye  -Pain at the site of injection or bruising at the site of injection  -Double vision  -Potential unknown long term risks  Contraindications: You should not have Botox if you are pregnant, nursing, allergic to albumin, have an infection, skin condition, or muscle weakness at the site of the injection, or have myasthenia gravis, Lambert-Eaton syndrome, or ALS.  It is also possible that as with any injection, there may be an allergic reaction or no effect from the medication. Reduced effectiveness after repeated injections is  sometimes seen and rarely infection at the injection site may occur. All care will be taken to prevent these side effects. If therapy is given over a long time, atrophy and wasting in the muscle injected may occur. Occasionally the patient's become refractory to treatment because they develop antibodies to the toxin. In this event, therapy needs to be modified.  I have read the above information and consent to the administration of botulism toxin.    ______________  _____   _________________  Patient signature     Date   Witness signature       BOTOX PROCEDURE NOTE FOR MIGRAINE HEADACHE    Contraindications and precautions discussed with patient(above). Aseptic procedure was observed and patient tolerated procedure. Procedure performed by Dr. Georgia Dom  The condition has existed for more than 6 months, and pt does not have a diagnosis of ALS, Myasthenia Gravis or Lambert-Eaton Syndrome. Risks and benefits of injections discussed and pt agrees to proceed with the procedure. Written consent obtained  These injections are medically necessary. He receives good benefits from these injections. These injections do not cause sedations or hallucinations which the oral therapies may cause.  Indication/Diagnosis: chronic migraine BOTOX(J0585) injection was performed according to protocol by [facility]. 200 units of BOTOX was dissolved into 4 cc NS.  NDC: [address]-87   Buy and bill  100 units x 2  [id number] 03/2020   Description of procedure:  The patient was placed in a sitting position. The standard protocol was used for Botox as follows, with 5 units of Botox injected at each  site:   -Procerus muscle, midline injection  -Corrugator muscle, bilateral injection  -Frontalis muscle, bilateral injection, with 2 sites each side, medial injection was performed in the upper one third of the frontalis muscle, in the region vertical from the medial inferior edge of the superior orbital  rim. The lateral injection was again in the upper one third of the forehead vertically above the lateral limbus of the cornea, 1.5 cm lateral to the medial injection site.  -Temporalis muscle injection, 4 sites, bilaterally. The first injection was 3 cm above the tragus of the ear, second injection site was 1.5 cm to 3 cm up from the first injection site in line with the tragus of the ear. The third injection site was 1.5-3 cm forward between the first 2 injection sites. The fourth injection site was 1.5 cm posterior to the second injection site.  -Occipitalis muscle injection, 3 sites, bilaterally. The first injection was done one half way between the occipital protuberance and the tip of the mastoid process behind the ear. The second injection site was done lateral and superior to the first, 1 fingerbreadth from the first injection. The third injection site was 1 fingerbreadth superiorly and medially from the first injection site.  -Cervical paraspinal muscle injection, 2 sites, bilateral knee first injection site was 1 cm from the midline of the cervical spine, 3 cm inferior to the lower border of the occipital protuberance. The second injection site was 1.5 cm superiorly and laterally to the first injection site.  -Trapezius muscle injection was performed at 3 sites, bilaterally. The first injection site was in the upper trapezius muscle halfway between the inflection point of the neck, and the acromion. The second injection site was one half way between the acromion and the first injection site. The third injection was done between the first injection site and the inflection point of the neck.   Will return for repeat injection in 3 months.   A 200 unit sof Botox was used, 155 units were injected, the rest of the Botox was wasted. The patient tolerated the procedure well, there were no complications of the above procedure.

## 2018-05-06 NOTE — Progress Notes (Signed)
Botox-100unitsx2 vials Lot: [id number] Expiration: 10/2020 NDC: 8828-0034-91 [id number]  Dx: Y80.165 B/B

## 2018-05-08 ENCOUNTER — Telehealth: Payer: Self-pay

## 2018-05-08 NOTE — Telephone Encounter (Signed)
Received a prior auth request for Aimovig 140mg /mL. I attempted to complete on cover my meds but it could not find a match for the patient so I called [employer] and this medication is APPROVED through 08/08/2018. Ref number is [id number].   Pt prescription coverage information:  RxBIN: [id number] RxPCN: [id number] RxGrp: PDPIND ID: [id number]

## 2018-05-09 NOTE — Telephone Encounter (Signed)
Received a faxed notification from [place] indicating [place] approved through August 08, 2018. [facility] Received a receipt of confirmation.

## 2018-05-14 ENCOUNTER — Telehealth: Payer: Self-pay | Admitting: Neurology

## 2018-05-14 NOTE — Telephone Encounter (Signed)
Patient's wife (on [place]) calling stating she went to pick up Erenumab-aooe (AIMOVIG) 140 MG/ML SOAJ at [facility] on [address].and was told medication was $400.00. She said pharmacist told her that the rep usually leaves coupons at doctor's office where the medication would be free. Please call and discuss.

## 2018-05-14 NOTE — Telephone Encounter (Signed)
[email or web address]@ Cover My Meds @ [phone] has called  To follow up on Prior Authorization.  Message from RN this morning was shared with Mitchell Baxter.  He is asking if the PA can be submitted on a better form.  If a call back is needed Mitchell Baxter provided Reference LAG:[id number]

## 2018-05-14 NOTE — Telephone Encounter (Signed)
[facility] and confirmed that the $400 Aimovig copay is the price after medicare approval. Retail price is [phone]. Called Thayer Headings an informed her that the copay cards unfortunately will not work with their insurance. She confirmed they have access to the internet. Directed her to [email or web address] where he can check eligibility, apply for assistance with Aimovig cost. She verbalized appreciation and will try to get the form completed and brought to [place] tomorrow so it can be finished and sent in to the foundation.

## 2018-05-15 NOTE — Telephone Encounter (Addendum)
PA was already completed on phone and approved by [facility]. Notified Mickel Baas from cover my meds.

## 2018-05-22 NOTE — Telephone Encounter (Signed)
Environmental manager prescription completed for Aimovig and faxed to [employer]. Received a receipt of confirmation. Called wife & left generic voicemail informing her that things have been completed and faxed. Left office number for call back if she has questions. Will send application to medical records to scan and keep on file.

## 2018-05-28 NOTE — Telephone Encounter (Signed)
Pt's wife Thayer Headings called to f/u on [employer] application. She was made aware that the application was sent on 05/22/18 and we are awaiting a determination. She verbalized appreciation.

## 2018-06-07 ENCOUNTER — Other Ambulatory Visit: Payer: Self-pay | Admitting: Neurology

## 2018-06-07 ENCOUNTER — Telehealth: Payer: Self-pay | Admitting: Neurology

## 2018-06-07 MED ORDER — METHYLPREDNISOLONE 4 MG PO TBPK: ORAL_TABLET | ORAL | 1 refills | Status: DC

## 2018-06-07 MED ORDER — RIZATRIPTAN BENZOATE 10 MG PO TBDP: 10.0000 mg | ORAL_TABLET | ORAL | 11 refills | Status: DC | PRN

## 2018-06-07 NOTE — Telephone Encounter (Signed)
Pts wife Thayer Headings called stating the pt has been experiencing an intense migraine since earlier this week. Pt has taken all medication regularly but nothing has seem to touch the migriane. Please call to advise

## 2018-06-07 NOTE — Telephone Encounter (Signed)
Called and spoke with the patients wife. The patient uses CPAP and is managed by Dr Maudie Mercury through [facility]. The wife states that the patient uses it nightly. Pt has not tried a taper steroid dose pack before nor Maxalt that she can think of. Informed the patient that we would send this medication into the pharmacy [facility] [address] with the hopes that this may break the cycle. Pt's wife verbalized understanding.

## 2018-06-13 NOTE — Telephone Encounter (Signed)
This application was faxed on 05/22/18 with receipt of confirmation. I have faxed it again with request to review ASAP. Received a receipt of confirmation. Will f/u via phone to be sure they receive this.

## 2018-06-13 NOTE — Telephone Encounter (Signed)
Pts wife Thayer Headings called stating that [employer] has not received the application form yet.

## 2018-06-14 NOTE — Telephone Encounter (Signed)
Spoke with [employer] @ [employer]. She stated that they have had an open case since 7/25  and received the application. However they need a copy of the approval letter. RN faxed approval notice to [phone] Case ID # [id number]. Received a receipt of confirmation. Alwyn Ren said she will call back and verbally confirm receipt of application. Called Thayer Headings, pt's wife and LVM informing her that the foundation should have everything they need now. Left office number in message if needed.

## 2018-06-19 NOTE — Telephone Encounter (Signed)
Received notice from [place] that patient has been approved to receive free Aimovig from 06/19/18 through 10/29/2018. Pt will need to re-enroll at the end of the enrollment period. Called pt's wife Thayer Headings and informed her of this good news. She was very Patent attorney.   Copy of notice sent to medical records.

## 2018-08-07 ENCOUNTER — Ambulatory Visit (INDEPENDENT_AMBULATORY_CARE_PROVIDER_SITE_OTHER): Payer: Medicare Other | Admitting: Neurology

## 2018-08-07 DIAGNOSIS — G43711 Chronic migraine without aura, intractable, with status migrainosus: Secondary | ICD-10-CM | POA: Diagnosis not present

## 2018-08-07 NOTE — Progress Notes (Signed)
Botox- 100 units x 2 vials Lot: [id number] Expiration: 01/2021 NDC: 4045-9136-85  Bacteriostatic 0.9% Sodium Chloride- 7mL total Lot: [id number] Expiration: 07/31/2019 NDC: 4436-0165-80  Dx: I63.494 B/B  Bupivocaine 0.5% 2 mL by MD LOT: [id number] EXP: 03/30/2020

## 2018-08-07 NOTE — Progress Notes (Signed)
Was having daily headaches and now 15 headache days with 8 migrainous, good response(>50% improvement with botox) but still with chronic migraines will introduce Aimovig(he has not taken it due to insurance, he is approved now) feel he will have even better response with cgrp WITh the botox.. +right occipital area.   Consent Form Botulism Toxin Injection For Chronic Migraine  Botulism toxin has been approved by the Federal drug administration for treatment of chronic migraine. Botulism toxin does not cure chronic migraine and it may not be effective in some patients.  The administration of botulism toxin is accomplished by injecting a small amount of toxin into the muscles of the neck and head. Dosage must be titrated for each individual. Any benefits resulting from botulism toxin tend to wear off after 3 months with a repeat injection required if benefit is to be maintained. Injections are usually done every 3-4 months with maximum effect peak achieved by about 2 or 3 weeks. Botulism toxin is expensive and you should be sure of what costs you will incur resulting from the injection.  The side effects of botulism toxin use for chronic migraine may include:   -Transient, and usually mild, facial weakness with facial injections  -Transient, and usually mild, head or neck weakness with head/neck injections  -Reduction or loss of forehead facial animation due to forehead muscle              weakness  -Eyelid drooping  -Dry eye  -Pain at the site of injection or bruising at the site of injection  -Double vision  -Potential unknown long term risks  Contraindications: You should not have Botox if you are pregnant, nursing, allergic to albumin, have an infection, skin condition, or muscle weakness at the site of the injection, or have myasthenia gravis, Lambert-Eaton syndrome, or ALS.  It is also possible that as with any injection, there may be an allergic reaction or no effect from the  medication. Reduced effectiveness after repeated injections is sometimes seen and rarely infection at the injection site may occur. All care will be taken to prevent these side effects. If therapy is given over a long time, atrophy and wasting in the muscle injected may occur. Occasionally the patient's become refractory to treatment because they develop antibodies to the toxin. In this event, therapy needs to be modified.  I have read the above information and consent to the administration of botulism toxin.    ______________  _____   _________________  Patient signature     Date   Witness signature       BOTOX PROCEDURE NOTE FOR MIGRAINE HEADACHE    Contraindications and precautions discussed with patient(above). Aseptic procedure was observed and patient tolerated procedure. Procedure performed by Dr. Georgia Dom  The condition has existed for more than 6 months, and pt does not have a diagnosis of ALS, Myasthenia Gravis or Lambert-Eaton Syndrome. Risks and benefits of injections discussed and pt agrees to proceed with the procedure. Written consent obtained  These injections are medically necessary. He receives good benefits from these injections. These injections do not cause sedations or hallucinations which the oral therapies may cause.  Indication/Diagnosis: chronic migraine BOTOX(J0585) injection was performed according to protocol by [employer]. 200 units of BOTOX was dissolved into 4 cc NS.  NDC: [address]-47   Buy and bill  100 units x 2  [id number] 03/2020   Description of procedure:  The patient was placed in a sitting position. The standard protocol was used  for Botox as follows, with 5 units of Botox injected at each site:   -Procerus muscle, midline injection  -Corrugator muscle, bilateral injection  -Frontalis muscle, bilateral injection, with 2 sites each side, medial injection was performed in the upper one third of the frontalis muscle, in the region  vertical from the medial inferior edge of the superior orbital rim. The lateral injection was again in the upper one third of the forehead vertically above the lateral limbus of the cornea, 1.5 cm lateral to the medial injection site.  -Temporalis muscle injection, 4 sites, bilaterally. The first injection was 3 cm above the tragus of the ear, second injection site was 1.5 cm to 3 cm up from the first injection site in line with the tragus of the ear. The third injection site was 1.5-3 cm forward between the first 2 injection sites. The fourth injection site was 1.5 cm posterior to the second injection site.  -Occipitalis muscle injection, 3 sites, bilaterally. The first injection was done one half way between the occipital protuberance and the tip of the mastoid process behind the ear. The second injection site was done lateral and superior to the first, 1 fingerbreadth from the first injection. The third injection site was 1 fingerbreadth superiorly and medially from the first injection site.  -Cervical paraspinal muscle injection, 2 sites, bilateral knee first injection site was 1 cm from the midline of the cervical spine, 3 cm inferior to the lower border of the occipital protuberance. The second injection site was 1.5 cm superiorly and laterally to the first injection site.  -Trapezius muscle injection was performed at 3 sites, bilaterally. The first injection site was in the upper trapezius muscle halfway between the inflection point of the neck, and the acromion. The second injection site was one half way between the acromion and the first injection site. The third injection was done between the first injection site and the inflection point of the neck.   Will return for repeat injection in 3 months.   A 200 unit sof Botox was used, 155 units were injected, the rest of the Botox was wasted. The patient tolerated the procedure well, there were no complications of the above procedure.

## 2018-09-17 ENCOUNTER — Other Ambulatory Visit: Payer: Self-pay

## 2018-10-08 ENCOUNTER — Other Ambulatory Visit: Payer: Self-pay | Admitting: *Deleted

## 2018-10-08 MED ORDER — TOPIRAMATE ER 200 MG PO SPRINKLE CAP24: 200.0000 mg | EXTENDED_RELEASE_CAPSULE | Freq: Every day | ORAL | 5 refills | Status: DC

## 2018-10-08 NOTE — Progress Notes (Signed)
Per Dr. Jaynee Eagles, will send pt's Qudexy XR prescription to pharmacy. Pt's wife states pt takes 200 mg QHS (1 capsule). Order placed.

## 2018-10-09 ENCOUNTER — Telehealth: Payer: Self-pay | Admitting: *Deleted

## 2018-10-09 NOTE — Telephone Encounter (Signed)
Completed Aimovig 140 mg PA on Cover My Meds for continuation. Awaiting determination from [facility]. Spoke with Jobe Gibbon on phone from prior authorizations and she said the PAs will be merged and determination is pending.

## 2018-10-09 NOTE — Telephone Encounter (Signed)
Pt's wife aware of approval and verbalized appreciation.

## 2018-10-09 NOTE — Telephone Encounter (Signed)
Pt's wife brought new application for [employer] for year 2020. MD portion completed and signed and faxed to [employer]. Received a receipt of confirmation. Per pt's wife's request, RN called to update her that fax was sent. She verbalized appreciation.

## 2018-10-09 NOTE — Telephone Encounter (Signed)
PA approved. [id number]. QUDEXY XR CAP 200/24HR is approved through 10/30/2019. For further questions, call [phone].   Faxed notice of approval to [facility] at [phone]. Received fax confirmation.

## 2018-10-09 NOTE — Telephone Encounter (Signed)
Tried initiating PA Qudexy XR 200mg  on CMM. Unable to locate pt. I called [facility] and spoke with [place]. She informed me that "JR" needed to be at the end of last name with a space. I re-submitted this way and was able to initiate PA on CMM. Key: [id number]. Submitted PA. Waiting on determination.  Pt has tried/failed: gabapentin, diclofenac, topiramate, sumatriptan, depakote, fluoxetine, indomethacin, reglan, propranolol, botox, aimovig, steroid taper pack, maxalt, wellbutrin, metoprolol, ibuprofen, tylenol. Dx: P71.062. Started Qudexy around 09/2017.

## 2018-10-10 NOTE — Telephone Encounter (Addendum)
Received notification that Aimovig 140 mg has been approved under medicare part D benefit through 10/30/2019. Faxed approval notice to [employer]. Received a receipt of confirmation. Spoke with pt's wife Thayer Headings and notified her of the approval. She verbalized appreciation.  Reference #- [id number]  For any questions/discussions call [phone].

## 2018-11-11 ENCOUNTER — Ambulatory Visit (INDEPENDENT_AMBULATORY_CARE_PROVIDER_SITE_OTHER): Payer: Medicare Other | Admitting: Neurology

## 2018-11-11 DIAGNOSIS — J320 Chronic maxillary sinusitis: Secondary | ICD-10-CM

## 2018-11-11 DIAGNOSIS — G43711 Chronic migraine without aura, intractable, with status migrainosus: Secondary | ICD-10-CM

## 2018-11-11 NOTE — Progress Notes (Signed)
Consent Form Botulism Toxin Injection For Chronic Migraine  Interval history 11/11/2018:  Was having daily headaches and migraines prior to botox with a 50% improvement of 15 headache days with 8 migrainous, good response(>50% improvement with botox) but still had chronic migraines and introduced Aimovig(at last appointment he had not taken it due to insurance, he is approved now through the [employer]) feel he will have even better response with cgrp in combo the botox.. +right occipital area. This is his 5th botox injection. In November he only had 4 migraine days from 66 which is exceptional. He has chronic sinusitis, maxillary pain, coughing need to order CT Maillofacial to see if sinusitis or other complications of sinusitis causing headaches.   Reviewed orally with patient, additionally signature is on file:  Botulism toxin has been approved by the Federal drug administration for treatment of chronic migraine. Botulism toxin does not cure chronic migraine and it may not be effective in some patients.  The administration of botulism toxin is accomplished by injecting a small amount of toxin into the muscles of the neck and head. Dosage must be titrated for each individual. Any benefits resulting from botulism toxin tend to wear off after 3 months with a repeat injection required if benefit is to be maintained. Injections are usually done every 3-4 months with maximum effect peak achieved by about 2 or 3 weeks. Botulism toxin is expensive and you should be sure of what costs you will incur resulting from the injection.  The side effects of botulism toxin use for chronic migraine may include:   -Transient, and usually mild, facial weakness with facial injections  -Transient, and usually mild, head or neck weakness with head/neck injections  -Reduction or loss of forehead facial animation due to forehead muscle weakness  -Eyelid drooping  -Dry eye  -Pain at the site of injection or  bruising at the site of injection  -Double vision  -Potential unknown long term risks  Contraindications: You should not have Botox if you are pregnant, nursing, allergic to albumin, have an infection, skin condition, or muscle weakness at the site of the injection, or have myasthenia gravis, Lambert-Eaton syndrome, or ALS.  It is also possible that as with any injection, there may be an allergic reaction or no effect from the medication. Reduced effectiveness after repeated injections is sometimes seen and rarely infection at the injection site may occur. All care will be taken to prevent these side effects. If therapy is given over a long time, atrophy and wasting in the muscle injected may occur. Occasionally the patient's become refractory to treatment because they develop antibodies to the toxin. In this event, therapy needs to be modified.  I have read the above information and consent to the administration of botulism toxin.    BOTOX PROCEDURE NOTE FOR MIGRAINE HEADACHE    Contraindications and precautions discussed with patient(above). Aseptic procedure was observed and patient tolerated procedure. Procedure performed by Dr. Georgia Dom  The condition has existed for more than 6 months, and pt does not have a diagnosis of ALS, Myasthenia Gravis or Lambert-Eaton Syndrome.  Risks and benefits of injections discussed and pt agrees to proceed with the procedure.  Written consent obtained  These injections are medically necessary. Pt  receives good benefits from these injections. These injections do not cause sedations or hallucinations which the oral therapies may cause.  Description of procedure:  The patient was placed in a sitting position. The standard protocol was used for Botox as  follows, with 5 units of Botox injected at each site:   -Procerus muscle, midline injection  -Corrugator muscle, bilateral injection  -Frontalis muscle, bilateral injection, with 2 sites each side,  medial injection was performed in the upper one third of the frontalis muscle, in the region vertical from the medial inferior edge of the superior orbital rim. The lateral injection was again in the upper one third of the forehead vertically above the lateral limbus of the cornea, 1.5 cm lateral to the medial injection site.  - Levator Scapulae: 5 units bilaterally  -Temporalis muscle injection, 5 sites, bilaterally. The first injection was 3 cm above the tragus of the ear, second injection site was 1.5 cm to 3 cm up from the first injection site in line with the tragus of the ear. The third injection site was 1.5-3 cm forward between the first 2 injection sites. The fourth injection site was 1.5 cm posterior to the second injection site. 5th site laterally in the temporalis  muscleat the level of the outer canthus.  - Patient feels her clenching is a trigger for headaches. +5 units masseter bilaterally   - Patient feels the migraines are centered around the eyes +5 units bilaterally at the outer canthus in the orbicularis occuli  -Occipitalis muscle injection, 3 sites, bilaterally. The first injection was done one half way between the occipital protuberance and the tip of the mastoid process behind the ear. The second injection site was done lateral and superior to the first, 1 fingerbreadth from the first injection. The third injection site was 1 fingerbreadth superiorly and medially from the first injection site.  -Cervical paraspinal muscle injection, 2 sites, bilateral knee first injection site was 1 cm from the midline of the cervical spine, 3 cm inferior to the lower border of the occipital protuberance. The second injection site was 1.5 cm superiorly and laterally to the first injection site.  -Trapezius muscle injection was performed at 3 sites, bilaterally. The first injection site was in the upper trapezius muscle halfway between the inflection point of the neck, and the acromion. The  second injection site was one half way between the acromion and the first injection site. The third injection was done between the first injection site and the inflection point of the neck.   Will return for repeat injection in 3 months.   A 200 unit sof Botox was used, any Botox not injected was wasted. The patient tolerated the procedure well, there were no complications of the above procedure.

## 2018-11-11 NOTE — Progress Notes (Signed)
Botox- 100 units x 2 vials Lot: [id number] Expiration: 02/2021 NDC: 4270-6237-62  Bacteriostatic 0.9% Sodium Chloride- 46mL total Lot: [id number] Expiration: 07/31/2019 NDC: 6160-7371-06  Dx: Y69.485 B/B

## 2018-11-12 ENCOUNTER — Telehealth: Payer: Self-pay | Admitting: Neurology

## 2018-11-12 ENCOUNTER — Ambulatory Visit
Admission: RE | Admit: 2018-11-12 | Discharge: 2018-11-12 | Disposition: A | Discharge status: Home / Self Care | Payer: Medicare Other | Source: Ambulatory Visit | Attending: Neurology | Admitting: Neurology

## 2018-11-12 DIAGNOSIS — R51 Headache: Secondary | ICD-10-CM | POA: Diagnosis not present

## 2018-11-12 DIAGNOSIS — J320 Chronic maxillary sinusitis: Secondary | ICD-10-CM

## 2018-11-12 NOTE — Telephone Encounter (Signed)
Medicare no auth spoke to patient yesterday since it was after 4:30 pm he could not go as a walk in at [place]. He stated he will go sometime today between 8 AM and 4 PM.

## 2018-11-15 ENCOUNTER — Telehealth: Payer: Self-pay | Admitting: *Deleted

## 2018-11-15 ENCOUNTER — Other Ambulatory Visit: Payer: Self-pay | Admitting: *Deleted

## 2018-11-15 DIAGNOSIS — J349 Unspecified disorder of nose and nasal sinuses: Secondary | ICD-10-CM

## 2018-11-15 NOTE — Telephone Encounter (Signed)
-----   Message from Melvenia Beam, MD sent at 11/14/2018  6:06 PM EST ----- Patient has Sinus disease than can be causing sinus headaches and triggering migraines. Is he willing to see an ENT, if he has no relationship already I can refer to Dr. Ernesto Rutherford? Let me know thanks.

## 2018-11-15 NOTE — Telephone Encounter (Signed)
I called the patient and discussed his CT maxillofacial results from Dr. Jaynee Eagles.  The patient verbalized understanding and is agreeable to receiving to a referral to Dr. Ernesto Rutherford.   Order placed per v.o. Dr. Jaynee Eagles.

## 2018-11-25 DIAGNOSIS — K210 Gastro-esophageal reflux disease with esophagitis: Secondary | ICD-10-CM | POA: Diagnosis not present

## 2018-11-25 DIAGNOSIS — J410 Simple chronic bronchitis: Secondary | ICD-10-CM | POA: Diagnosis not present

## 2018-11-25 DIAGNOSIS — R05 Cough: Secondary | ICD-10-CM | POA: Diagnosis not present

## 2018-11-25 DIAGNOSIS — J322 Chronic ethmoidal sinusitis: Secondary | ICD-10-CM | POA: Diagnosis not present

## 2018-11-25 DIAGNOSIS — J342 Deviated nasal septum: Secondary | ICD-10-CM | POA: Diagnosis not present

## 2018-11-25 DIAGNOSIS — J301 Allergic rhinitis due to pollen: Secondary | ICD-10-CM | POA: Diagnosis not present

## 2018-11-25 DIAGNOSIS — J320 Chronic maxillary sinusitis: Secondary | ICD-10-CM | POA: Diagnosis not present

## 2018-12-04 DIAGNOSIS — J320 Chronic maxillary sinusitis: Secondary | ICD-10-CM | POA: Diagnosis not present

## 2018-12-04 DIAGNOSIS — J322 Chronic ethmoidal sinusitis: Secondary | ICD-10-CM | POA: Diagnosis not present

## 2019-02-05 ENCOUNTER — Telehealth: Payer: Self-pay | Admitting: Neurology

## 2019-02-05 NOTE — Telephone Encounter (Signed)
I called and canceled the patient due to COVID-19. Patient was agreeable. DW

## 2019-02-13 ENCOUNTER — Ambulatory Visit: Payer: Medicare Other | Admitting: Neurology

## 2019-02-18 DIAGNOSIS — I10 Essential (primary) hypertension: Secondary | ICD-10-CM | POA: Diagnosis not present

## 2019-02-18 DIAGNOSIS — Z1212 Encounter for screening for malignant neoplasm of rectum: Secondary | ICD-10-CM | POA: Diagnosis not present

## 2019-02-18 DIAGNOSIS — F419 Anxiety disorder, unspecified: Secondary | ICD-10-CM | POA: Diagnosis not present

## 2019-02-18 DIAGNOSIS — E7800 Pure hypercholesterolemia, unspecified: Secondary | ICD-10-CM | POA: Diagnosis not present

## 2019-02-18 DIAGNOSIS — N529 Male erectile dysfunction, unspecified: Secondary | ICD-10-CM | POA: Diagnosis not present

## 2019-02-18 DIAGNOSIS — G43919 Migraine, unspecified, intractable, without status migrainosus: Secondary | ICD-10-CM | POA: Diagnosis not present

## 2019-02-18 DIAGNOSIS — Z125 Encounter for screening for malignant neoplasm of prostate: Secondary | ICD-10-CM | POA: Diagnosis not present

## 2019-02-25 NOTE — Telephone Encounter (Signed)
I called and spoke with the patient regarding changing his apt due to COVID-19. I confirmed that he has not shown any new symptoms, been exposed to the virus nor been running a fever. I also informed him she would need to wear a mask and gloves.

## 2019-03-11 ENCOUNTER — Telehealth: Payer: Self-pay | Admitting: Neurology

## 2019-03-11 ENCOUNTER — Other Ambulatory Visit: Payer: Self-pay

## 2019-03-11 ENCOUNTER — Ambulatory Visit (INDEPENDENT_AMBULATORY_CARE_PROVIDER_SITE_OTHER): Payer: Medicare Other | Admitting: Neurology

## 2019-03-11 DIAGNOSIS — G43711 Chronic migraine without aura, intractable, with status migrainosus: Secondary | ICD-10-CM

## 2019-03-11 MED ORDER — ATENOLOL 25 MG PO TABS: 25.0000 mg | ORAL_TABLET | Freq: Every day | ORAL | 11 refills | Status: DC

## 2019-03-11 NOTE — Telephone Encounter (Signed)
Mitchell Baxter, I prescribed atenolol for patient as we discussed in the botox appointment. Would you give him a ring and just let him know? He can take it at bedtime. I discussed side effects but watch for SOB, CP, lightheadedness, fatigue.

## 2019-03-11 NOTE — Telephone Encounter (Signed)
error 

## 2019-03-11 NOTE — Progress Notes (Addendum)
Consent Form Botulism Toxin Injection For Chronic Migraine  Interval history 03/11/2019:  Was having daily headaches and migraines prior to botox with a 50% improvement of 15 headache days with 8 migrainous, good response(>50% improvement with botox) but still had chronic migraines and introduced Aimovig(at last appointment he had not taken it due to insurance, he is approved now through the [employer]) feel he will have even better response with cgrp in combo the botox.. he has taken 4 aimovig shots and feels it helps the headaches are a lot lighter. We have reduced the migraine frequency with the botox and the severity with the aimovig. +right occipital area. This is his 6th botox injection. In April he had 8 migraine days and at baseline was daily and those 8 migraine days are 50% improved with the aimovig. We completed CT Maillofacial to see if sinusitis or other complications of sinusitis causing headaches and he saw an ENT. He is on Topiramate. He is on Aimovig. The Maxalt helps tremendously acutely, it is the only thing he has ever taken to help. His blood pressures around 130 and pulse 60-80 will start a low dose atenolol for migraine prevention discussed side effects watch BP and pulse especially and stop for any side effects especially CP, SOB, lightheadedness.  Reviewed orally with patient, additionally signature is on file:  Botulism toxin has been approved by the Federal drug administration for treatment of chronic migraine. Botulism toxin does not cure chronic migraine and it may not be effective in some patients.  The administration of botulism toxin is accomplished by injecting a small amount of toxin into the muscles of the neck and head. Dosage must be titrated for each individual. Any benefits resulting from botulism toxin tend to wear off after 3 months with a repeat injection required if benefit is to be maintained. Injections are usually done every 3-4 months with maximum  effect peak achieved by about 2 or 3 weeks. Botulism toxin is expensive and you should be sure of what costs you will incur resulting from the injection.  The side effects of botulism toxin use for chronic migraine may include:   -Transient, and usually mild, facial weakness with facial injections  -Transient, and usually mild, head or neck weakness with head/neck injections  -Reduction or loss of forehead facial animation due to forehead muscle weakness  -Eyelid drooping  -Dry eye  -Pain at the site of injection or bruising at the site of injection  -Double vision  -Potential unknown long term risks  Contraindications: You should not have Botox if you are pregnant, nursing, allergic to albumin, have an infection, skin condition, or muscle weakness at the site of the injection, or have myasthenia gravis, Lambert-Eaton syndrome, or ALS.  It is also possible that as with any injection, there may be an allergic reaction or no effect from the medication. Reduced effectiveness after repeated injections is sometimes seen and rarely infection at the injection site may occur. All care will be taken to prevent these side effects. If therapy is given over a long time, atrophy and wasting in the muscle injected may occur. Occasionally the patient's become refractory to treatment because they develop antibodies to the toxin. In this event, therapy needs to be modified.  I have read the above information and consent to the administration of botulism toxin.    BOTOX PROCEDURE NOTE FOR MIGRAINE HEADACHE    Contraindications and precautions discussed with patient(above). Aseptic procedure was observed and patient tolerated procedure. Procedure performed  by Dr. Georgia Dom  The condition has existed for more than 6 months, and pt does not have a diagnosis of ALS, Myasthenia Gravis or Lambert-Eaton Syndrome.  Risks and benefits of injections discussed and pt agrees to proceed with the procedure.  Written  consent obtained  These injections are medically necessary. Pt  receives good benefits from these injections. These injections do not cause sedations or hallucinations which the oral therapies may cause.  Description of procedure:  The patient was placed in a sitting position. The standard protocol was used for Botox as follows, with 5 units of Botox injected at each site:   -Procerus muscle, midline injection  -Corrugator muscle, bilateral injection  -Frontalis muscle, bilateral injection, with 2 sites each side, medial injection was performed in the upper one third of the frontalis muscle, in the region vertical from the medial inferior edge of the superior orbital rim. The lateral injection was again in the upper one third of the forehead vertically above the lateral limbus of the cornea, 1.5 cm lateral to the medial injection site.  - Levator Scapulae: 5 units bilaterally  -Temporalis muscle injection, 5 sites, bilaterally. The first injection was 3 cm above the tragus of the ear, second injection site was 1.5 cm to 3 cm up from the first injection site in line with the tragus of the ear. The third injection site was 1.5-3 cm forward between the first 2 injection sites. The fourth injection site was 1.5 cm posterior to the second injection site. 5th site laterally in the temporalis  muscleat the level of the outer canthus.  - Patient feels her clenching is a trigger for headaches. +5 units masseter bilaterally   - Patient feels the migraines are centered around the eyes +5 units bilaterally at the outer canthus in the orbicularis occuli  -Occipitalis muscle injection, 3 sites, bilaterally. The first injection was done one half way between the occipital protuberance and the tip of the mastoid process behind the ear. The second injection site was done lateral and superior to the first, 1 fingerbreadth from the first injection. The third injection site was 1 fingerbreadth superiorly and  medially from the first injection site.  -Cervical paraspinal muscle injection, 2 sites, bilateral knee first injection site was 1 cm from the midline of the cervical spine, 3 cm inferior to the lower border of the occipital protuberance. The second injection site was 1.5 cm superiorly and laterally to the first injection site.  -Trapezius muscle injection was performed at 3 sites, bilaterally. The first injection site was in the upper trapezius muscle halfway between the inflection point of the neck, and the acromion. The second injection site was one half way between the acromion and the first injection site. The third injection was done between the first injection site and the inflection point of the neck.   Will return for repeat injection in 3 months.   A 200 unit sof Botox was used, any Botox not injected was wasted. The patient tolerated the procedure well, there were no complications of the above procedure.

## 2019-03-11 NOTE — Progress Notes (Signed)
Botox- 100 units x 2 vials Lot: [id number] Expiration: 07/2021 NDC: 7711-6579-03  Bacteriostatic 0.9% Sodium Chloride- 2mL total Lot: [id number] Expiration: 07/31/2019 NDC: 2919-1660-60  Dx: O45.997 B/B  [facility] [address] and spoke with pharmacy staff. Pt is taking Valsartan-HCTZ 160-12.5 mg (1/2 tablet daily).

## 2019-03-11 NOTE — Telephone Encounter (Signed)
I called the patient and LVM (ok per DPR) advising pt that Dr. Jaynee Eagles prescribed Atenolol as discussed in the office this morning. I advised the dosage is 25 mg oral once daily and he can take this at bedtime. Advised pt to watch for side effects such as SOB, CP, lightheadedness, fatigue. For any serious/emergency concerns, call 911. For any questions, call office. Left office number in message.

## 2019-03-11 NOTE — Patient Instructions (Signed)
Atenolol tablets What is this medicine? ATENOLOL (a TEN oh lole) is a beta-blocker. Beta-blockers reduce the workload on the heart and help it to beat more regularly. This medicine is used to treat high blood pressure and to prevent chest pain. It is also used to protect the heart during a heart attack and to prevent an additional heart attack from occurring. This medicine may be used for other purposes; ask your health care provider or pharmacist if you have questions. COMMON BRAND NAME(S): Tenormin What should I tell my health care provider before I take this medicine? They need to know if you have any of these conditions: -diabetes -heart or vessel disease like slow heart rate, worsening heart failure, heart block, sick sinus syndrome or Raynaud's disease -kidney disease -lung or breathing disease, like asthma or emphysema -pheochromocytoma -thyroid disease -an unusual or allergic reaction to atenolol, other beta-blockers, medicines, foods, dyes, or preservatives -pregnant or trying to get pregnant -breast-feeding How should I use this medicine? Take this medicine by mouth with a drink of water. Follow the directions on the prescription label. This medicine may be taken with or without food. Take your medicine at regular intervals. Do not take more medicine than directed. Do not stop taking this medicine suddenly. This could lead to serious heart-related effects. Talk to your pediatrician regarding the use of this medicine in children. Special care may be needed. Overdosage: If you think you have taken too much of this medicine contact a poison control center or emergency room at once. NOTE: This medicine is only for you. Do not share this medicine with others. What if I miss a dose? If you miss a dose, take it as soon as you can. If it is almost time for your next dose, take only that dose. Do not take double or extra doses. What may interact with this medicine? This medicine may interact  with the following medications: -certain medicines for blood pressure, heart disease, irregular heart beat -clonidine -digoxin -diuretics -dobutamine -epinephrine -isoproterenol -NSAIDs, medicines for pain and inflammation, like ibuprofen or naproxen -reserpine This list may not describe all possible interactions. Give your health care provider a list of all the medicines, herbs, non-prescription drugs, or dietary supplements you use. Also tell them if you smoke, drink alcohol, or use illegal drugs. Some items may interact with your medicine. What should I watch for while using this medicine? Visit your doctor or health care professional for regular check ups. Check your blood pressure and pulse rate regularly. Ask your health care professional what your blood pressure and pulse rate should be, and when you should contact him or her. You may get drowsy or dizzy. Do not drive, use machinery, or do anything that needs mental alertness until you know how this medicine affects you. Do not stand or sit up quickly. Alcohol may interfere with the effect of this medicine. Avoid alcoholic drinks. This medicine can affect blood sugar levels. If you have diabetes, check with your doctor or health care professional before you change your diet or the dose of your diabetic medicine. Do not treat yourself for coughs, colds, or pain while you are taking this medicine without asking your doctor or health care professional for advice. Some ingredients may increase your blood pressure. What side effects may I notice from receiving this medicine? Side effects that you should report to your doctor or health care professional as soon as possible: -allergic reactions like skin rash, itching or hives, swelling of the face, lips,  or tongue -breathing problems -changes in vision -chest pain -cold, tingling, or numb hands or feet -depression -fast, irregular heartbeat -feeling faint or lightheaded, falls -fever with  sore throat -rapid weight gain -swollen ankles, legs Side effects that usually do not require medical attention (report to your doctor or health care professional if they continue or are bothersome): -anxiety, nervous -diarrhea -dry skin -change in sex drive or performance -headache -nightmares or trouble sleeping -short term memory loss -stomach upset -unusually tired This list may not describe all possible side effects. Call your doctor for medical advice about side effects. You may report side effects to FDA at 1-800-FDA-1088. Where should I keep my medicine? Keep out of the reach of children. Store at room temperature between 20 and 25 degrees C (68 and 77 degrees F). Close tightly and protect from light. Throw away any unused medicine after the expiration date. NOTE: This sheet is a summary. It may not cover all possible information. If you have questions about this medicine, talk to your doctor, pharmacist, or health care provider.  2019 [employer]vier/[employer] Standard (2013-06-22 14:21:28)

## 2019-06-18 ENCOUNTER — Telehealth: Payer: Self-pay | Admitting: Neurology

## 2019-06-18 ENCOUNTER — Other Ambulatory Visit: Payer: Self-pay

## 2019-06-18 ENCOUNTER — Ambulatory Visit (INDEPENDENT_AMBULATORY_CARE_PROVIDER_SITE_OTHER): Payer: Medicare Other | Admitting: Neurology

## 2019-06-18 VITALS — Temp 97.3°F

## 2019-06-18 DIAGNOSIS — H5461 Unqualified visual loss, right eye, normal vision left eye: Secondary | ICD-10-CM | POA: Diagnosis not present

## 2019-06-18 DIAGNOSIS — G43709 Chronic migraine without aura, not intractable, without status migrainosus: Secondary | ICD-10-CM | POA: Diagnosis not present

## 2019-06-18 DIAGNOSIS — I639 Cerebral infarction, unspecified: Secondary | ICD-10-CM

## 2019-06-18 DIAGNOSIS — K30 Functional dyspepsia: Secondary | ICD-10-CM | POA: Diagnosis not present

## 2019-06-18 DIAGNOSIS — H539 Unspecified visual disturbance: Secondary | ICD-10-CM | POA: Diagnosis not present

## 2019-06-18 DIAGNOSIS — R42 Dizziness and giddiness: Secondary | ICD-10-CM

## 2019-06-18 DIAGNOSIS — G43711 Chronic migraine without aura, intractable, with status migrainosus: Secondary | ICD-10-CM | POA: Diagnosis not present

## 2019-06-18 DIAGNOSIS — R61 Generalized hyperhidrosis: Secondary | ICD-10-CM | POA: Diagnosis not present

## 2019-06-18 DIAGNOSIS — K117 Disturbances of salivary secretion: Secondary | ICD-10-CM | POA: Diagnosis not present

## 2019-06-18 DIAGNOSIS — R41840 Attention and concentration deficit: Secondary | ICD-10-CM | POA: Diagnosis not present

## 2019-06-18 NOTE — Telephone Encounter (Signed)
Medicare no auth patient scheduled at [place] for 06/18/19.

## 2019-06-18 NOTE — Progress Notes (Signed)
Botox- 100 units x 2 vials Lot: [id number] Expiration: 09/2021 NDC: 0962-8366-29  Bacteriostatic 0.9% Sodium Chloride- 26mL total Lot: [id number] Expiration: 07/31/2019 NDC: 5035-4656-81  Dx: E75.170 B/B

## 2019-06-18 NOTE — Progress Notes (Signed)
Was having daily headaches and now 25 free headache days, exceptional response. +right occipital area.   Consent Form Botulism Toxin Injection For Chronic Migraine  Botulism toxin has been approved by the Federal drug administration for treatment of chronic migraine. Botulism toxin does not cure chronic migraine and it may not be effective in some patients.  The administration of botulism toxin is accomplished by injecting a small amount of toxin into the muscles of the neck and head. Dosage must be titrated for each individual. Any benefits resulting from botulism toxin tend to wear off after 3 months with a repeat injection required if benefit is to be maintained. Injections are usually done every 3-4 months with maximum effect peak achieved by about 2 or 3 weeks. Botulism toxin is expensive and you should be sure of what costs you will incur resulting from the injection.  The side effects of botulism toxin use for chronic migraine may include:   -Transient, and usually mild, facial weakness with facial injections  -Transient, and usually mild, head or neck weakness with head/neck injections  -Reduction or loss of forehead facial animation due to forehead muscle              weakness  -Eyelid drooping  -Dry eye  -Pain at the site of injection or bruising at the site of injection  -Double vision  -Potential unknown long term risks  Contraindications: You should not have Botox if you are pregnant, nursing, allergic to albumin, have an infection, skin condition, or muscle weakness at the site of the injection, or have myasthenia gravis, Lambert-Eaton syndrome, or ALS.  It is also possible that as with any injection, there may be an allergic reaction or no effect from the medication. Reduced effectiveness after repeated injections is sometimes seen and rarely infection at the injection site may occur. All care will be taken to prevent these side effects. If therapy is given over a long time,  atrophy and wasting in the muscle injected may occur. Occasionally the patient's become refractory to treatment because they develop antibodies to the toxin. In this event, therapy needs to be modified.  I have read the above information and consent to the administration of botulism toxin.    ______________  _____   _________________  Patient signature     Date   Witness signature       BOTOX PROCEDURE NOTE FOR MIGRAINE HEADACHE    Contraindications and precautions discussed with patient(above). Aseptic procedure was observed and patient tolerated procedure. Procedure performed by Dr. Georgia Dom  The condition has existed for more than 6 months, and pt does not have a diagnosis of ALS, Myasthenia Gravis or Lambert-Eaton Syndrome. Risks and benefits of injections discussed and pt agrees to proceed with the procedure. Written consent obtained  These injections are medically necessary. He receives good benefits from these injections. These injections do not cause sedations or hallucinations which the oral therapies may cause.  Indication/Diagnosis: chronic migraine BOTOX(J0585) injection was performed according to protocol by [employer]. 200 units of BOTOX was dissolved into 4 cc NS.  NDC: [address]-54   Buy and bill  100 units x 2  [id number] 03/2020   Description of procedure:  The patient was placed in a sitting position. The standard protocol was used for Botox as follows, with 5 units of Botox injected at each site:   -Procerus muscle, midline injection  -Corrugator muscle, bilateral injection  -Frontalis muscle, bilateral injection, with 2 sites each side, medial injection was  performed in the upper one third of the frontalis muscle, in the region vertical from the medial inferior edge of the superior orbital rim. The lateral injection was again in the upper one third of the forehead vertically above the lateral limbus of the cornea, 1.5 cm lateral to the medial  injection site.  -Temporalis muscle injection, 4 sites, bilaterally. The first injection was 3 cm above the tragus of the ear, second injection site was 1.5 cm to 3 cm up from the first injection site in line with the tragus of the ear. The third injection site was 1.5-3 cm forward between the first 2 injection sites. The fourth injection site was 1.5 cm posterior to the second injection site.  -Occipitalis muscle injection, 3 sites, bilaterally. The first injection was done one half way between the occipital protuberance and the tip of the mastoid process behind the ear. The second injection site was done lateral and superior to the first, 1 fingerbreadth from the first injection. The third injection site was 1 fingerbreadth superiorly and medially from the first injection site.  -Cervical paraspinal muscle injection, 2 sites, bilateral knee first injection site was 1 cm from the midline of the cervical spine, 3 cm inferior to the lower border of the occipital protuberance. The second injection site was 1.5 cm superiorly and laterally to the first injection site.  -Trapezius muscle injection was performed at 3 sites, bilaterally. The first injection site was in the upper trapezius muscle halfway between the inflection point of the neck, and the acromion. The second injection site was one half way between the acromion and the first injection site. The third injection was done between the first injection site and the inflection point of the neck.   Will return for repeat injection in 3 months.   A 200 unit sof Botox was used, 155 units were injected, the rest of the Botox was wasted. The patient tolerated the procedure well, there were no complications of the above procedure.

## 2019-06-19 ENCOUNTER — Telehealth: Payer: Self-pay | Admitting: *Deleted

## 2019-06-19 ENCOUNTER — Telehealth: Payer: Self-pay | Admitting: Neurology

## 2019-06-19 ENCOUNTER — Other Ambulatory Visit: Payer: Self-pay | Admitting: Neurology

## 2019-06-19 DIAGNOSIS — R42 Dizziness and giddiness: Secondary | ICD-10-CM

## 2019-06-19 LAB — CBC WITH DIFFERENTIAL/PLATELET
Basophils Absolute: 0.1 10*3/uL (ref 0.0–0.2)
Basos: 1 %
EOS (ABSOLUTE): 0.3 10*3/uL (ref 0.0–0.4)
Eos: 3 %
Hematocrit: 46.5 % (ref 37.5–51.0)
Hemoglobin: 16.2 g/dL (ref 13.0–17.7)
Immature Grans (Abs): 0 10*3/uL (ref 0.0–0.1)
Immature Granulocytes: 0 %
Lymphocytes Absolute: 3 10*3/uL (ref 0.7–3.1)
Lymphs: 38 %
MCH: 32.2 pg (ref 26.6–33.0)
MCHC: 34.8 g/dL (ref 31.5–35.7)
MCV: 92 fL (ref 79–97)
Monocytes Absolute: 1 10*3/uL — ABNORMAL HIGH (ref 0.1–0.9)
Monocytes: 12 %
Neutrophils Absolute: 3.6 10*3/uL (ref 1.4–7.0)
Neutrophils: 46 %
Platelets: 214 10*3/uL (ref 150–450)
RBC: 5.03 x10E6/uL (ref 4.14–5.80)
RDW: 12.9 % (ref 11.6–15.4)
WBC: 7.9 10*3/uL (ref 3.4–10.8)

## 2019-06-19 LAB — COMPREHENSIVE METABOLIC PANEL
ALT: 14 IU/L (ref 0–44)
AST: 17 IU/L (ref 0–40)
Albumin/Globulin Ratio: 2 (ref 1.2–2.2)
Albumin: 4.3 g/dL (ref 3.8–4.8)
Alkaline Phosphatase: 45 IU/L (ref 39–117)
BUN/Creatinine Ratio: 8 — ABNORMAL LOW (ref 10–24)
BUN: 9 mg/dL (ref 8–27)
Bilirubin Total: 0.5 mg/dL (ref 0.0–1.2)
CO2: 23 mmol/L (ref 20–29)
Calcium: 9.5 mg/dL (ref 8.6–10.2)
Chloride: 104 mmol/L (ref 96–106)
Creatinine, Ser: 1.1 mg/dL (ref 0.76–1.27)
GFR calc Af Amer: 79 mL/min/{1.73_m2} (ref >59)
GFR calc non Af Amer: 68 mL/min/{1.73_m2} (ref >59)
Globulin, Total: 2.1 g/dL (ref 1.5–4.5)
Glucose: 87 mg/dL (ref 65–99)
Potassium: 4.4 mmol/L (ref 3.5–5.2)
Sodium: 144 mmol/L (ref 134–144)
Total Protein: 6.4 g/dL (ref 6.0–8.5)

## 2019-06-19 NOTE — Telephone Encounter (Signed)
Pt's wife (on [place]) aware labs normal. She got the message of MRI results. She stated pt saw Dr. Ernesto Rutherford 3-4 months ago, retention cyst known. She stated he told pt to take some medication, cyst didn't need tx at that time. She asked where pt should go from here.

## 2019-06-19 NOTE — Telephone Encounter (Addendum)
Per Dr. Jaynee Eagles, I called the pt. Main # on chart is his wife Janice's cell (on [place]). I LVM (ok per DPR) and advised her of the MRI results indicating pt did NOT have a stroke. MRA normal, MRI brain normal for age, retention cyst found which per Dr. Jaynee Eagles may be causing pt's headaches. I advised Dr. Jaynee Eagles would review the images and then we will be back in touch regarding possible ENT referral.  Imaging not received yet.

## 2019-06-19 NOTE — Telephone Encounter (Signed)
Vestibular therapy for epley maneuvers. I willplace consult thanks

## 2019-06-19 NOTE — Telephone Encounter (Signed)
Pt Cd on [employer].

## 2019-06-19 NOTE — Telephone Encounter (Signed)
Can you get me his MRIs on CD please? THANK YOU!

## 2019-06-19 NOTE — Telephone Encounter (Signed)
-----   Message from Melvenia Beam, MD sent at 06/19/2019 10:05 AM EDT ----- Labs normal thanks

## 2019-06-19 NOTE — Telephone Encounter (Signed)
Pt results on your desk.

## 2019-06-19 NOTE — Telephone Encounter (Signed)
Results

## 2019-06-19 NOTE — Progress Notes (Signed)
[facility]    Provider:  Dr Jaynee Eagles  CC:  Dizziness, vertigo and intractable headache.  Interval history 06/19/2019: Patient here for botox procedure today however concerning new symptoms of acute onset vertigo and vision loss of the right eye. We performed the botox procedure and also had a brief appointment to address concerning symptoms. Episode of dizziness Monday night. Abrupt onset vertigo not due to head movement. Not on aspirin. With nausea, sweating, this is a new incident, severe, lasted 5 minutes and still feels bad, he felt bad all day yesterday. Also vision changes acute, his vision has changed, right eye is blurry and can;t see out of it, acutely with the vertigo. I'm concerned about stoke with severe acute onset vertigo and vision loss in the right eye. He has multiple risk factors, long term current smoker, not on ASA. I am concerned for embolic right facial numbness. I spoke to his wife as well who was in the office and provides much information.  Interval history June 04 2017: This is a 68 year old male with decades of headaches since the 1980s and originally saw Dr. Erling Cruz here in our practice, failed multiple medications, is seen multiple neurologists and headache clinics. He is here for follow-up on headache. He has a past medical history of chronic headache, migraine, depression, anxiety, high cholesterol, hypertension, vertigo, COPD, sleep apnea. There has been a component of medication overuse in the past but non in the last 6 months. Patient was also seen for tremor and reported falls, routine eeg and 3 day EEG were both normal. He is on depakote. No OTC using his cpap. Here for neck pain in the right side of the neck, stays on the right occipital areas sometimes it radiates to the neck and to the top of the head. He has sensitivity.It hurts all the time. No decreased ROM of the neck. Daily headaches. Constant 24 hours a day.  They are throbbing, associated  nausea, vomiting, photophobia and phonophobia. No aura. Patient has daily migraines that are continuous and last up to 24 hours.No other focal neurologic deficits, associated symptoms, inciting events or modifiable factors.  Tried: gabapentin, depakote, prozac, Indomethacin, reglan, propranolol, He doesn't remember all themedications he has tried in the last 40 years but he has tried them all.    HPI:  ZIGMOND TRELA is a 68 y.o. male here as a referral from Dr. Toy Cookey for dizziness, intractable headache. He has a past medical history of chronic headache, migraine, depression, anxiety, high cholesterol, hypertension, vertigo, COPD, sleep apnea.   He has been having spells of vertigo, started 6 months ago. It happens when he is outside. He happened one time driving on turning head, once when standing in line. It lasted 5 minutes, with associated nausea, no vomiting. If he lays still it goes away. He has sinus congestion and drainage and coughing. He has very bad itching in the ears. He has lost hearing in both ears. No ear fullness. Vertigo happens once a week. Getting worse. His head feels funny and cloudy. He has sinus issues, sinus drainage, he is sneezing.  He was outside 2 weeks ago and he bent over, his arm started shaking, he fell over. He has been having bad shakes. His hands shake.  He did not lose consciousness. He fell over, he was laying on the group, and his arm was shaking very bad. He bent down to look at something and he just kept going, there was no vertigo, landed on the ground,  no urination or defecation, he was on the ground for a minute, his head did "not feel right" his brain feels spongy and fuzzy. His head hurts all the time. His voice is slurred recently.  Several years ago he would cough very hard and black out but that has not happened in a long time. He was seen by r Love in the past and was diagnosed with rebound migraines and also has headaches. Her patient Dr. love was not  able to find anything to help him with his headaches and these headaches are chronic for years. He takes neurontin for neuropathy in the legs. No recent illnesses, no infection, no fever. His right ear was impacted. He has chronic headaches, daily headaches, the pain starts in the back of the neck, pressure headaches around the head. He used to have migraines and the headaches have transformed into what it is now for the last several years. He wakes up with headaches. No snoring. He likes to sleep all day, wife says there is depression that is not well treated. He is having blurry vision and vision changes.   Patient has had headaches since the 1980s and he was seen at a headache clinic and took medications in the 1980s without benefits. He was also seen by several providers in 2011 here at [facility] and started on multiple medications without benefit for his headaches.  Reviewed notes, labs and imaging from outside physicians, which showed: Patient referred here by primary care for lightheaded and dizziness over the last month. Patient was in [employer] at 1. felt dizzy and sweaty denied need chest pain. Denied panic attack. Unable to move head without becoming dizzy with some nausea, no vomiting.  Personally reviewed images of MRI of the brain in April 2010 which was normal.  Review previous notes for Dr. Jeneen Rinks love back in 2011. Patient was seen by Dr. love for headaches initially in June 2011. MRI of the brain and intracranial MRA in 2007 were normal. He was seen for headaches in the posterior head regions mostly on the right and then daily for 6 weeks. Headaches were nonstop. Nothing made them better or worse. No change with bending over cough sneeze or Valsalva. There were no associated visual changes. Dr. love noted a past medical history of migraines. He was started on diclofenac and Topiramate. CBC and CMP were unremarkable, [facility] spotted fever IgG was negative,  [facility] spotted fever IgM was negative, ESR was normal, Lyme IgG and IgM were both negative at the time. He was later seen by Dr. Cloretta Ned NP for follow-up in August 2011 in our office. Patient had improvement on the diclofenac and Topamax for 2 weeks then the headaches returned. Topamax was increased in a daily muscle relaxant was provided. At the next follow-up in April 2012 he had tried Imitrex which caused chest pain. He was also taking over-the-counter medication approximately 40 month. Apparently he was started on Depakote at that time but stopped it without explanation. At that time he was suggested to restart Depakote and obtain sleep study and stop over-the-counter pain meds.   Review of Systems: Patient complains of symptoms per HPI as well as the following symptoms: No chest pain, no shortness of breath, no fevers, no meningismus. Pertinent negatives and positives per HPI. All others negative.   Social History   Socioeconomic History   Marital status: Married    Spouse name: Jan   Number of children: 2   Years of education: 72  Highest education level: Not on file  Occupational History   Occupation: Administrator, Civil Service strain: Not on file   Food insecurity    Worry: Not on file    Inability: Not on file   Transportation needs    Medical: Not on file    Non-medical: Not on file  Tobacco Use   Smoking status: Current Every Day Smoker    Packs/day: 1.00    Years: 40.00    Pack years: 40.00    Types: Cigarettes   Smokeless tobacco: Never Used  Substance and Sexual Activity   Alcohol use: Yes    Comment: occasional   Drug use: No   Sexual activity: Not on file  Lifestyle   Physical activity    Days per week: Not on file    Minutes per session: Not on file   Stress: Not on file  Relationships   Social connections    Talks on phone: Not on file    Gets together: Not on file    Attends religious service: Not on  file    Active member of club or organization: Not on file    Attends meetings of clubs or organizations: Not on file    Relationship status: Not on file   Intimate partner violence    Fear of current or ex partner: Not on file    Emotionally abused: Not on file    Physically abused: Not on file    Forced sexual activity: Not on file  Other Topics Concern   Not on file  Social History Narrative   Lives at home with his wife.   Caffeine use: 2-3 cups daily of coffee/soda    Family History  Problem Relation Age of Onset   Colon cancer Father        pancreatic   Anesthesia problems Neg Hx    Hypotension Neg Hx    Malignant hyperthermia Neg Hx    Pseudochol deficiency Neg Hx    Migraines Neg Hx     Past Medical History:  Diagnosis Date   COPD (chronic obstructive pulmonary disease) (HCC)    Depression    GERD (gastroesophageal reflux disease)    Headache(784.0)    migraine   Hyperlipidemia    Hypertension    Shortness of breath    exertion   Sleep apnea    stopbang=4   Vertigo     Past Surgical History:  Procedure Laterality Date   BRAVO [place] STUDY  11/29/2011   Procedure: BRAVO [place] STUDY;  Surgeon: Landry Dyke, MD;  Location: [facility];  Service: Endoscopy;  Laterality: N/A;   COLONOSCOPY  10/03/2012   Procedure: COLONOSCOPY;  Surgeon: Shann Medal, MD;  Location: [facility];  Service: General;  Laterality: N/A;  carol/leone   HIATAL HERNIA REPAIR  04/16/2012   Procedure: LAPAROSCOPIC REPAIR OF HIATAL HERNIA;  Surgeon: Pedro Earls, MD;  Location: [facility];  Service: General;;   LAPAROSCOPIC NISSEN FUNDOPLICATION  05/24/2034   Procedure: LAPAROSCOPIC NISSEN FUNDOPLICATION;  Surgeon: Pedro Earls, MD;  Location: [facility];  Service: General;  Laterality: N/A;   none      Current Outpatient Medications  Medication Sig Dispense Refill   atenolol (TENORMIN) 25 MG tablet Take 1 tablet (25 mg total) by mouth daily. 30 tablet 11    atorvastatin (LIPITOR) 40 MG tablet Take 40 mg by mouth daily.     Erenumab-aooe (AIMOVIG) 140 MG/ML SOAJ Inject 140 mg into the  skin every 30 (thirty) days. 1 pen 11   FLUoxetine (PROZAC) 40 MG capsule Take 40 mg by mouth every evening.      gabapentin (NEURONTIN) 300 MG capsule Take 300 mg by mouth every morning.  6   methylPREDNISolone (MEDROL DOSEPAK) 4 MG TBPK tablet Take pills in the morning each day with food 21 tablet 1   Probiotic Product (PROBIOTIC PO) Take 1 tablet by mouth daily.     rizatriptan (MAXALT-MLT) 10 MG disintegrating tablet Take 1 tablet (10 mg total) by mouth as needed for migraine. May repeat in 2 hours if needed. Max 2x a day 9 tablet 11   topiramate ER (QUDEXY XR) 200 MG CS24 sprinkle capsule Take 1 capsule (200 mg total) by mouth at bedtime. 30 capsule 5   valsartan-hydrochlorothiazide (DIOVAN-HCT) 160-12.5 MG tablet TAKE 1/2 TABLET BY MOUTH ONCE A DAY     No current facility-administered medications for this visit.     Allergies as of 06/18/2019   (No Known Allergies)    Vitals: Temp (!) 97.3 F (36.3 C)  Last Weight:  Wt Readings from Last 1 Encounters:  05/06/18 219 lb 12.8 oz (99.7 kg)   Last Height:   Ht Readings from Last 1 Encounters:  05/06/18 6' (1.829 m)    Physical exam: Exam: Gen: NAD, conversant, well nourised, obese, well groomed                     CV: RRR, no MRG. No Carotid Bruits. No peripheral edema, warm, nontender Eyes: Conjunctivae clear without exudates or hemorrhage  Neuro: Detailed Neurologic Exam  Speech:    Speech is normal; fluent and spontaneous with normal comprehension.  Cognition:    The patient is oriented to person, place, and time;     recent and remote memory intact;     language fluent;     normal attention, concentration,     fund of knowledge Cranial Nerves:    The pupils are equal, round, and reactive to light. The fundi are normal and spontaneous venous pulsations are present. Visual fields  are full to finger confrontation. Extraocular movements are intact. Trigeminal sensation is intact and the muscles of mastication are normal. The face is symmetric. The palate elevates in the midline. Hearing intact. Voice is normal. Shoulder shrug is normal. The tongue has normal motion without fasciculations.   Coordination:    Normal finger to nose and heel to shin. Normal rapid alternating movements.   Gait:    Heel-toe and tandem gait are normal.   Motor Observation:    No asymmetry, no atrophy, and no involuntary movements noted. Tone:    Normal muscle tone.    Posture:    Posture is normal. normal erect    Strength:    Strength is V/V in the upper and lower limbs.      Sensation: intact to LT     Reflex Exam:  DTR's:    Deep tendon reflexes in the upper and lower extremities are normal bilaterally.   Toes:    The toes are downgoing bilaterally.   Clonus:    Clonus is absent.   Assessment/Plan:  This is a 68 year old male with decades of headaches since the 1980s and originally saw Dr. Erling Cruz here in our practice, failed multiple medications, is seen multiple neurologists and headache clinics. Diagnosis is chronic intractable migraines without aura and with status migrainosus.doing well on botox and CGRP.  - He is here today for concerning acute onset  vertigo with vision loss of the right eye. I am concerned for embolic stroke and will send him for MRIs. I called [facility] as well as [facility] and got him imaging today.   Discussed: To prevent or relieve headaches, try the following: Cool Compress. Lie down and place a cool compress on your head.  Avoid headache triggers. If certain foods or odors seem to have triggered your migraines in the past, avoid them. A headache diary might help you identify triggers.  Include physical activity in your daily routine. Try a daily walk or other moderate aerobic exercise.  Manage stress. Find healthy ways to cope with the  stressors, such as delegating tasks on your to-do list.  Practice relaxation techniques. Try deep breathing, yoga, massage and visualization.  Eat regularly. Eating regularly scheduled meals and maintaining a healthy diet might help prevent headaches. Also, drink plenty of fluids.  Follow a regular sleep schedule. Sleep deprivation might contribute to headaches Consider biofeedback. With this mind-body technique, you learn to control certain bodily functions -- such as muscle tension, heart rate and blood pressure -- to prevent headaches or reduce headache pain.    Proceed to emergency room if you experience new or worsening symptoms or symptoms do not resolve, if you have new neurologic symptoms or if headache is severe, or for any concerning symptom.   Provided education and documentation from American headache Society toolbox including articles on: chronic migraine medication overuse headache, chronic migraines, prevention of migraines, behavioral and other nonpharmacologic treatments for headache.   Sarina Ill, MD  [facility] [address] [place], [place] [address]  Phone [phone] Fax [phone]  A total of 40 minutes was spent face-to-face with this patient. Over half this time was spent on counseling patient on the  1. Cerebrovascular accident (CVA), unspecified mechanism ([place])   2. Chronic migraine without aura, with intractable migraine, so stated, with status migrainosus   3. Vertigo   4. Vision loss, right eye    diagnosis and different diagnostic and therapeutic options, counseling and coordination of care, risks ans benefits of management, compliance, or risk factor reduction and education.  This does not include time spent on botox procedure

## 2019-06-19 NOTE — Telephone Encounter (Signed)
Spoke with Dr. Jaynee Eagles. MRA head is normal. MRI brain is normal for age. Some mild white matter changes not out of proportion to age. Per the report, the pt has a moderately sized mucosal retention cyst in the R maxillary sinus. Dr. Jaynee Eagles will review the images. Pt may need an ENT referral. She will be in touch.

## 2019-06-19 NOTE — Telephone Encounter (Signed)
Mitchell Baxter or Mitchell Baxter - Can I get the results from his MRI yesterday at [facility] asap please? Thank you!

## 2019-06-23 NOTE — Telephone Encounter (Signed)
I called pt's wife Thayer Headings (on [place]) and LVM (ok per DPR) advising the next step is for the pt to start vestibular therapy for epley maneuvers which should help the vertigo. I advised a referal had been sent to the neuro rehab next door and they are welcome to wait for their call or she can call [phone] to schedule. Left our office number in message for call back if she has any questions.

## 2019-08-13 DIAGNOSIS — E7800 Pure hypercholesterolemia, unspecified: Secondary | ICD-10-CM | POA: Diagnosis not present

## 2019-08-13 DIAGNOSIS — E559 Vitamin D deficiency, unspecified: Secondary | ICD-10-CM | POA: Diagnosis not present

## 2019-08-13 DIAGNOSIS — I10 Essential (primary) hypertension: Secondary | ICD-10-CM | POA: Diagnosis not present

## 2019-08-13 DIAGNOSIS — Z125 Encounter for screening for malignant neoplasm of prostate: Secondary | ICD-10-CM | POA: Diagnosis not present

## 2019-08-14 ENCOUNTER — Telehealth: Payer: Self-pay | Admitting: *Deleted

## 2019-08-14 NOTE — Telephone Encounter (Signed)
Called pt & LVM asking for call back. Advised MD out of office on 11/24, need to r/s pt. Offered Thurs 11/19. Left office number in message.   When pt calls back, please r/s his botox appt from 09/23/2019 to 09/18/2019 if he is able. We currently have 1 pm or 2:30 pm available. Please check schedule to be sure this hasn't changed when pt calls back.

## 2019-08-14 NOTE — Telephone Encounter (Signed)
Spoke with pt's wife Thayer Headings (on [place]). She understands Dr. Jaynee Eagles out of office 11/24 and she r/s pt's botox appt to 11/19 @ 2:30 pm. She verbalized appreciation for the call.

## 2019-08-20 DIAGNOSIS — I10 Essential (primary) hypertension: Secondary | ICD-10-CM | POA: Diagnosis not present

## 2019-08-20 DIAGNOSIS — Z0000 Encounter for general adult medical examination without abnormal findings: Secondary | ICD-10-CM | POA: Diagnosis not present

## 2019-08-20 DIAGNOSIS — Z23 Encounter for immunization: Secondary | ICD-10-CM | POA: Diagnosis not present

## 2019-08-20 DIAGNOSIS — E559 Vitamin D deficiency, unspecified: Secondary | ICD-10-CM | POA: Diagnosis not present

## 2019-08-20 DIAGNOSIS — F17219 Nicotine dependence, cigarettes, with unspecified nicotine-induced disorders: Secondary | ICD-10-CM | POA: Diagnosis not present

## 2019-08-20 DIAGNOSIS — E7800 Pure hypercholesterolemia, unspecified: Secondary | ICD-10-CM | POA: Diagnosis not present

## 2019-09-04 ENCOUNTER — Other Ambulatory Visit: Payer: Self-pay | Admitting: Internal Medicine

## 2019-09-04 DIAGNOSIS — F17219 Nicotine dependence, cigarettes, with unspecified nicotine-induced disorders: Secondary | ICD-10-CM

## 2019-09-12 ENCOUNTER — Ambulatory Visit
Admission: RE | Admit: 2019-09-12 | Discharge: 2019-09-12 | Disposition: A | Discharge status: Home / Self Care | Payer: Medicare Other | Source: Ambulatory Visit | Attending: Internal Medicine | Admitting: Internal Medicine

## 2019-09-12 DIAGNOSIS — F17219 Nicotine dependence, cigarettes, with unspecified nicotine-induced disorders: Secondary | ICD-10-CM

## 2019-09-12 DIAGNOSIS — F17210 Nicotine dependence, cigarettes, uncomplicated: Secondary | ICD-10-CM | POA: Diagnosis not present

## 2019-09-18 ENCOUNTER — Other Ambulatory Visit: Payer: Self-pay

## 2019-09-18 ENCOUNTER — Ambulatory Visit (INDEPENDENT_AMBULATORY_CARE_PROVIDER_SITE_OTHER): Payer: Medicare Other | Admitting: Neurology

## 2019-09-18 VITALS — Temp 97.7°F

## 2019-09-18 DIAGNOSIS — R5382 Chronic fatigue, unspecified: Secondary | ICD-10-CM

## 2019-09-18 DIAGNOSIS — R200 Anesthesia of skin: Secondary | ICD-10-CM

## 2019-09-18 DIAGNOSIS — I639 Cerebral infarction, unspecified: Secondary | ICD-10-CM | POA: Diagnosis not present

## 2019-09-18 DIAGNOSIS — R29898 Other symptoms and signs involving the musculoskeletal system: Secondary | ICD-10-CM

## 2019-09-18 DIAGNOSIS — G43711 Chronic migraine without aura, intractable, with status migrainosus: Secondary | ICD-10-CM

## 2019-09-18 DIAGNOSIS — M542 Cervicalgia: Secondary | ICD-10-CM | POA: Diagnosis not present

## 2019-09-18 DIAGNOSIS — M7918 Myalgia, other site: Secondary | ICD-10-CM | POA: Diagnosis not present

## 2019-09-18 DIAGNOSIS — G4733 Obstructive sleep apnea (adult) (pediatric): Secondary | ICD-10-CM | POA: Diagnosis not present

## 2019-09-18 DIAGNOSIS — G8929 Other chronic pain: Secondary | ICD-10-CM

## 2019-09-18 DIAGNOSIS — R413 Other amnesia: Secondary | ICD-10-CM | POA: Diagnosis not present

## 2019-09-18 DIAGNOSIS — R519 Headache, unspecified: Secondary | ICD-10-CM

## 2019-09-18 NOTE — Progress Notes (Signed)
MRI cervical spine   Names mixed up, get disoriented on where he is, he is forgetting conversations and phone calls. He is giving information to people he Consent Form Botulism Toxin Injection For Chronic Migraine  Was having daily headaches and now 25 free headache days, exceptional response. +right occipital area.   Reviewed orally with patient, additionally signature is on file:  Botulism toxin has been approved by the Federal drug administration for treatment of chronic migraine. Botulism toxin does not cure chronic migraine and it may not be effective in some patients.  The administration of botulism toxin is accomplished by injecting a small amount of toxin into the muscles of the neck and head. Dosage must be titrated for each individual. Any benefits resulting from botulism toxin tend to wear off after 3 months with a repeat injection required if benefit is to be maintained. Injections are usually done every 3-4 months with maximum effect peak achieved by about 2 or 3 weeks. Botulism toxin is expensive and you should be sure of what costs you will incur resulting from the injection.  The side effects of botulism toxin use for chronic migraine may include:   -Transient, and usually mild, facial weakness with facial injections  -Transient, and usually mild, head or neck weakness with head/neck injections  -Reduction or loss of forehead facial animation due to forehead muscle weakness  -Eyelid drooping  -Dry eye  -Pain at the site of injection or bruising at the site of injection  -Double vision  -Potential unknown long term risks  Contraindications: You should not have Botox if you are pregnant, nursing, allergic to albumin, have an infection, skin condition, or muscle weakness at the site of the injection, or have myasthenia gravis, Lambert-Eaton syndrome, or ALS.  It is also possible that as with any injection, there may be an allergic reaction or no effect from the medication.  Reduced effectiveness after repeated injections is sometimes seen and rarely infection at the injection site may occur. All care will be taken to prevent these side effects. If therapy is given over a long time, atrophy and wasting in the muscle injected may occur. Occasionally the patient's become refractory to treatment because they develop antibodies to the toxin. In this event, therapy needs to be modified.  I have read the above information and consent to the administration of botulism toxin.    BOTOX PROCEDURE NOTE FOR MIGRAINE HEADACHE    Contraindications and precautions discussed with patient(above). Aseptic procedure was observed and patient tolerated procedure. Procedure performed by Dr. Georgia Dom  The condition has existed for more than 6 months, and pt does not have a diagnosis of ALS, Myasthenia Gravis or Lambert-Eaton Syndrome.  Risks and benefits of injections discussed and pt agrees to proceed with the procedure.  Written consent obtained  These injections are medically necessary. Pt  receives good benefits from these injections. These injections do not cause sedations or hallucinations which the oral therapies may cause.  Description of procedure:  The patient was placed in a sitting position. The standard protocol was used for Botox as follows, with 5 units of Botox injected at each site:   -Procerus muscle, midline injection  -Corrugator muscle, bilateral injection  -Frontalis muscle, bilateral injection, with 2 sites each side, medial injection was performed in the upper one third of the frontalis muscle, in the region vertical from the medial inferior edge of the superior orbital rim. The lateral injection was again in the upper one third of the forehead  vertically above the lateral limbus of the cornea, 1.5 cm lateral to the medial injection site.  -Temporalis muscle injection, 4 sites, bilaterally. The first injection was 3 cm above the tragus of the ear, second  injection site was 1.5 cm to 3 cm up from the first injection site in line with the tragus of the ear. The third injection site was 1.5-3 cm forward between the first 2 injection sites. The fourth injection site was 1.5 cm posterior to the second injection site.   -Occipitalis muscle injection, 3 sites, bilaterally. The first injection was done one half way between the occipital protuberance and the tip of the mastoid process behind the ear. The second injection site was done lateral and superior to the first, 1 fingerbreadth from the first injection. The third injection site was 1 fingerbreadth superiorly and medially from the first injection site.  -Cervical paraspinal muscle injection, 2 sites, bilateral knee first injection site was 1 cm from the midline of the cervical spine, 3 cm inferior to the lower border of the occipital protuberance. The second injection site was 1.5 cm superiorly and laterally to the first injection site.  -Trapezius muscle injection was performed at 3 sites, bilaterally. The first injection site was in the upper trapezius muscle halfway between the inflection point of the neck, and the acromion. The second injection site was one half way between the acromion and the first injection site. The third injection was done between the first injection site and the inflection point of the neck.   Will return for repeat injection in 3 months.   200 units of Botox was used, any Botox not injected was wasted. The patient tolerated the procedure well, there were no complications of the above procedure.

## 2019-09-18 NOTE — Progress Notes (Signed)
Botox- 200 units x 1 vial Lot: [id number] Expiration: 05/2022 NDC: 6725-5001-64  Bacteriostatic 0.9% Sodium Chloride- 79mL total Lot: [id number] Expiration: 01/29/2020 NDC: 5583-1674-25  Dx: L25.894 B/B

## 2019-09-19 NOTE — Progress Notes (Signed)
[facility]    Provider:  Dr Jaynee Eagles  CC:  Dizziness, vertigo and intractable headache. Memory loss, chronic neck pain  This is one of my favorite patients here for Botox.  He has significantly improved.  However he is here today with his wife with other concerning symptoms.  Patient has noticed his memory slowly progressively worsening, wife has as well.  More notable in the last month but ongoing for possibly a few years, more short-term memory loss.  More noticeable in the last month but ongoing for a year or two. More short-term memory. Forgetting conversations. Wife helps him with medications. She also took over the bills. He has taken his wife's pills by mistake. Progressively worsening. Mother has dementia Alzheimer's.  Patient is quite reliable, he has good insight and does believe he has memory changes, I recommended formal memory testing for this lovely patient.  Unclear whether this is due to pseudodementia secondary to chronic pain, lifestyle, chronic smoking, depression.  We will get a baseline memory test.  He has also been under my care for his neck problems for over a year, worsening, right-sided neck pain, right arm weakness and numbness, palpable pain, over-the-counter medications, heat, Botox injections into the muscle have not helped at this time we really need to get an MRI of the cervical spine to look for spondylosis, stenosis, radiculopathy.  We will also refer him for dry needling.  We will also refer him to our sleep team to see if his sleep apnea has something to do with it, he has had a CPAP machine for years and years, very old, no chip, has not been tested in a very long time, we will send him for retesting as untreated sleep apnea can cause lots of memory loss and he is extremely fatigued all the time.   Interval history 06/19/2019: Patient here for botox procedure today however concerning new symptoms of acute onset vertigo and vision loss of the right  eye. We performed the botox procedure and also had a brief appointment to address concerning symptoms. Episode of dizziness Monday night. Abrupt onset vertigo not due to head movement. Not on aspirin. With nausea, sweating, this is a new incident, severe, lasted 5 minutes and still feels bad, he felt bad all day yesterday. Also vision changes acute, his vision has changed, right eye is blurry and can;t see out of it, acutely with the vertigo. I'm concerned about stoke with severe acute onset vertigo and vision loss in the right eye. He has multiple risk factors, long term current smoker, not on ASA. I am concerned for embolic right facial numbness. I spoke to his wife as well who was in the office and provides much information.  Interval history June 04 2017: This is a 68 year old male with decades of headaches since the 1980s and originally saw Dr. Erling Cruz here in our practice, failed multiple medications, is seen multiple neurologists and headache clinics. He is here for follow-up on headache. He has a past medical history of chronic headache, migraine, depression, anxiety, high cholesterol, hypertension, vertigo, COPD, sleep apnea. There has been a component of medication overuse in the past but non in the last 6 months. Patient was also seen for tremor and reported falls, routine eeg and 3 day EEG were both normal. He is on depakote. No OTC using his cpap. Here for neck pain in the right side of the neck, stays on the right occipital areas sometimes it radiates to the neck and to the  top of the head. He has sensitivity.It hurts all the time. No decreased ROM of the neck. Daily headaches. Constant 24 hours a day.  They are throbbing, associated nausea, vomiting, photophobia and phonophobia. No aura. Patient has daily migraines that are continuous and last up to 24 hours.No other focal neurologic deficits, associated symptoms, inciting events or modifiable factors.  Tried: gabapentin, depakote, prozac,  Indomethacin, reglan, propranolol, He doesn't remember all themedications he has tried in the last 40 years but he has tried them all.    HPI:  Mitchell Baxter is a 68 y.o. male here as a referral from Dr. Toy Cookey for dizziness, intractable headache. He has a past medical history of chronic headache, migraine, depression, anxiety, high cholesterol, hypertension, vertigo, COPD, sleep apnea.   He has been having spells of vertigo, started 6 months ago. It happens when he is outside. He happened one time driving on turning head, once when standing in line. It lasted 5 minutes, with associated nausea, no vomiting. If he lays still it goes away. He has sinus congestion and drainage and coughing. He has very bad itching in the ears. He has lost hearing in both ears. No ear fullness. Vertigo happens once a week. Getting worse. His head feels funny and cloudy. He has sinus issues, sinus drainage, he is sneezing.  He was outside 2 weeks ago and he bent over, his arm started shaking, he fell over. He has been having bad shakes. His hands shake.  He did not lose consciousness. He fell over, he was laying on the group, and his arm was shaking very bad. He bent down to look at something and he just kept going, there was no vertigo, landed on the ground, no urination or defecation, he was on the ground for a minute, his head did "not feel right" his brain feels spongy and fuzzy. His head hurts all the time. His voice is slurred recently.  Several years ago he would cough very hard and black out but that has not happened in a long time. He was seen by r Love in the past and was diagnosed with rebound migraines and also has headaches. Her patient Dr. love was not able to find anything to help him with his headaches and these headaches are chronic for years. He takes neurontin for neuropathy in the legs. No recent illnesses, no infection, no fever. His right ear was impacted. He has chronic headaches, daily headaches, the  pain starts in the back of the neck, pressure headaches around the head. He used to have migraines and the headaches have transformed into what it is now for the last several years. He wakes up with headaches. No snoring. He likes to sleep all day, wife says there is depression that is not well treated. He is having blurry vision and vision changes.   Patient has had headaches since the 1980s and he was seen at a headache clinic and took medications in the 1980s without benefits. He was also seen by several providers in 2011 [facility] at [facility] and started on multiple medications without benefit for his headaches.  Reviewed notes, labs and imaging from outside physicians, which showed: Patient referred here by primary care for lightheaded and dizziness over the last month. Patient was in [employer] at 1. felt dizzy and sweaty denied need chest pain. Denied panic attack. Unable to move head without becoming dizzy with some nausea, no vomiting.  Personally reviewed images of MRI of the brain in April  2010 which was normal.  Review previous notes for Dr. Jeneen Rinks love back in 2011. Patient was seen by Dr. love for headaches initially in June 2011. MRI of the brain and intracranial MRA in 2007 were normal. He was seen for headaches in the posterior head regions mostly on the right and then daily for 6 weeks. Headaches were nonstop. Nothing made them better or worse. No change with bending over cough sneeze or Valsalva. There were no associated visual changes. Dr. love noted a past medical history of migraines. He was started on diclofenac and Topiramate. CBC and CMP were unremarkable, [facility] spotted fever IgG was negative, [facility] spotted fever IgM was negative, ESR was normal, Lyme IgG and IgM were both negative at the time. He was later seen by Dr. Cloretta Ned NP for follow-up in August 2011 in our office. Patient had improvement on the diclofenac and Topamax for 2 weeks  then the headaches returned. Topamax was increased in a daily muscle relaxant was provided. At the next follow-up in April 2012 he had tried Imitrex which caused chest pain. He was also taking over-the-counter medication approximately 40 month. Apparently he was started on Depakote at that time but stopped it without explanation. At that time he was suggested to restart Depakote and obtain sleep study and stop over-the-counter pain meds.   Review of Systems: Patient complains of symptoms per HPI as well as the following symptoms: No chest pain, no shortness of breath, no fevers, no meningismus. Pertinent negatives and positives per HPI. All others negative.   Social History   Socioeconomic History   Marital status: Married    Spouse name: Jan   Number of children: 2   Years of education: 12   Highest education level: Not on file  Occupational History   Occupation: Administrator, Civil Service strain: Not on file   Food insecurity    Worry: Not on file    Inability: Not on Lexicographer needs    Medical: Not on file    Non-medical: Not on file  Tobacco Use   Smoking status: Current Every Day Smoker    Packs/day: 1.00    Years: 40.00    Pack years: 40.00    Types: Cigarettes   Smokeless tobacco: Never Used  Substance and Sexual Activity   Alcohol use: Yes    Comment: occasional   Drug use: No   Sexual activity: Not on file  Lifestyle   Physical activity    Days per week: Not on file    Minutes per session: Not on file   Stress: Not on file  Relationships   Social connections    Talks on phone: Not on file    Gets together: Not on file    Attends religious service: Not on file    Active member of club or organization: Not on file    Attends meetings of clubs or organizations: Not on file    Relationship status: Not on file   Intimate partner violence    Fear of current or ex partner: Not on file    Emotionally abused: Not  on file    Physically abused: Not on file    Forced sexual activity: Not on file  Other Topics Concern   Not on file  Social History Narrative   Lives at home with his wife.   Caffeine use: 2-3 cups daily of coffee/soda    Family History  Problem Relation Age of  Onset   Colon cancer Father        pancreatic   Anesthesia problems Neg Hx    Hypotension Neg Hx    Malignant hyperthermia Neg Hx    Pseudochol deficiency Neg Hx    Migraines Neg Hx     Past Medical History:  Diagnosis Date   COPD (chronic obstructive pulmonary disease) (HCC)    Depression    GERD (gastroesophageal reflux disease)    Headache(784.0)    migraine   Hyperlipidemia    Hypertension    Shortness of breath    exertion   Sleep apnea    stopbang=4   Vertigo     Past Surgical History:  Procedure Laterality Date   BRAVO [place] STUDY  11/29/2011   Procedure: BRAVO [place] STUDY;  Surgeon: Landry Dyke, MD;  Location: [facility];  Service: Endoscopy;  Laterality: N/A;   COLONOSCOPY  10/03/2012   Procedure: COLONOSCOPY;  Surgeon: Shann Medal, MD;  Location: [facility];  Service: General;  Laterality: N/A;  carol/leone   HIATAL HERNIA REPAIR  04/16/2012   Procedure: LAPAROSCOPIC REPAIR OF HIATAL HERNIA;  Surgeon: Pedro Earls, MD;  Location: [facility];  Service: General;;   LAPAROSCOPIC NISSEN FUNDOPLICATION  12/28/6008   Procedure: LAPAROSCOPIC NISSEN FUNDOPLICATION;  Surgeon: Pedro Earls, MD;  Location: [facility];  Service: General;  Laterality: N/A;   none      Current Outpatient Medications  Medication Sig Dispense Refill   atenolol (TENORMIN) 25 MG tablet Take 1 tablet (25 mg total) by mouth daily. 30 tablet 11   atorvastatin (LIPITOR) 40 MG tablet Take 40 mg by mouth daily.     Erenumab-aooe (AIMOVIG) 140 MG/ML SOAJ Inject 140 mg into the skin every 30 (thirty) days. 1 pen 11   FLUoxetine (PROZAC) 40 MG capsule Take 40 mg by mouth every evening.      gabapentin  (NEURONTIN) 300 MG capsule Take 300 mg by mouth every morning.  6   methylPREDNISolone (MEDROL DOSEPAK) 4 MG TBPK tablet Take pills in the morning each day with food 21 tablet 1   Probiotic Product (PROBIOTIC PO) Take 1 tablet by mouth daily.     rizatriptan (MAXALT-MLT) 10 MG disintegrating tablet Take 1 tablet (10 mg total) by mouth as needed for migraine. May repeat in 2 hours if needed. Max 2x a day 9 tablet 11   topiramate ER (QUDEXY XR) 200 MG CS24 sprinkle capsule Take 1 capsule (200 mg total) by mouth at bedtime. 30 capsule 5   valsartan-hydrochlorothiazide (DIOVAN-HCT) 160-12.5 MG tablet TAKE 1/2 TABLET BY MOUTH ONCE A DAY     No current facility-administered medications for this visit.     Allergies as of 09/18/2019   (No Known Allergies)    Vitals: Temp 97.7 F (36.5 C) Comment: taken at front. wife 97.3 Last Weight:  Wt Readings from Last 1 Encounters:  05/06/18 219 lb 12.8 oz (99.7 kg)   Last Height:   Ht Readings from Last 1 Encounters:  05/06/18 6' (1.829 m)    Physical exam: Exam: Gen: NAD, conversant, pleasant, affect appears slightly depressed  Neuro: Detailed Neurologic Exam  Speech:    Speech is normal; fluent and spontaneous with normal comprehension.  Cognition:    The patient is oriented to person, place, and time;  Cranial Nerves:    The pupils are equal, round, and reactive to light. Visual fields are full to finger confrontation. Extraocular movements are intact. Trigeminal sensation is intact and the muscles  of mastication are normal. The face is symmetric. The palate elevates in the midline. Hearing intact. Voice is normal. Shoulder shrug is normal. The tongue has normal motion without fasciculations.   Motor Observation:    No asymmetry, no atrophy, and no involuntary movements noted. Tone:    Normal muscle tone.    Posture:    Posture is normal. normal erect    Strength:   Weak grip right hand, prox right arm weakness. Otherwise  strength is V/V in the upper and lower limbs.        Assessment/Plan:  This is a 68 year old male with decades of headaches since the 1980s and originally saw Dr. Erling Cruz here in our practice, failed multiple medications, is seen multiple neurologists and headache clinics. Diagnosis is chronic intractable migraines without aura and with status migrainosus.doing well on botox and CGRP.  - He is here today with his wife with other concerning symptoms.  Patient has noticed his memory slowly progressively worsening, wife has as well.  More notable in the last month but ongoing for possibly a few years, more short-term memory loss.  More noticeable in the last month but ongoing for a year or two. More short-term memory. Forgetting conversations. Wife helps him with medications. She also took over the bills. He has taken his wife's pills by mistake. Progressively worsening. Mother has dementia Alzheimer's.  Patient is quite reliable, he has good insight and does believe he has memory changes, I recommended formal memory testing for this lovely patient.  Unclear whether this is due to pseudodementia secondary to chronic pain, lifestyle, chronic smoking, depression.  We will get a baseline memory test.   - He has also been under my care for his neck problems for over a year, worsening, right-sided neck pain, right arm weakness and numbness, palpable pain, over-the-counter medications, heat, Botox injections into the muscle have not helped at this time we really need to get an MRI of the cervical spine to look for spondylosis, stenosis, radiculopathy.  We will also refer him for dry needling.   -  We will also refer him to our sleep team to see if his sleep apnea has something to do with it, he has had a CPAP machine for years and years, very old, no chip, has not been tested in a very long time, we will send him for retesting as untreated sleep apnea can cause lots of memory loss and he is extremely fatigued all the  time.  Orders Placed This Encounter  Procedures   MR [place]   Ambulatory referral to Neuropsychology   Ambulatory referral to Physical Therapy   Ambulatory referral to Sleep Studies         PRIOR:  - He is here today for concerning acute onset vertigo with vision loss of the right eye. I am concerned for embolic stroke and will send him for MRIs. I called [facility] as well as [facility] and got him imaging today.   Discussed: To prevent or relieve headaches, try the following: Cool Compress. Lie down and place a cool compress on your head.  Avoid headache triggers. If certain foods or odors seem to have triggered your migraines in the past, avoid them. A headache diary might help you identify triggers.  Include physical activity in your daily routine. Try a daily walk or other moderate aerobic exercise.  Manage stress. Find healthy ways to cope with the stressors, such as delegating tasks on your to-do list.  Practice relaxation techniques. Try deep breathing, yoga, massage and visualization.  Eat regularly. Eating regularly scheduled meals and maintaining a healthy diet might help prevent headaches. Also, drink plenty of fluids.  Follow a regular sleep schedule. Sleep deprivation might contribute to headaches Consider biofeedback. With this mind-body technique, you learn to control certain bodily functions -- such as muscle tension, heart rate and blood pressure -- to prevent headaches or reduce headache pain.    Proceed to emergency room if you experience new or worsening symptoms or symptoms do not resolve, if you have new neurologic symptoms or if headache is severe, or for any concerning symptom.   Provided education and documentation from American headache Society toolbox including articles on: chronic migraine medication overuse headache, chronic migraines, prevention of migraines, behavioral and other nonpharmacologic treatments for  headache.   Sarina Ill, MD  [facility] [address] [place], [place] [address]  Phone [phone] Fax [phone]  A total of 25 minutes was spent face-to-face with this patient. Over half this time was spent on counseling patient on the  1. Chronic migraine without aura, with intractable migraine, so stated, with status migrainosus   2. Cervicalgia   3. Chronic neck pain   4. Neck pain on right side   5. Right arm weakness   6. Right arm numbness   7. Memory loss   8. Cervical myofascial pain syndrome   9. OSA (obstructive sleep apnea)   10. Chronic fatigue   11. Morning headache    diagnosis and different diagnostic and therapeutic options, counseling and coordination of care, risks ans benefits of management, compliance, or risk factor reduction and education.  This does not include time spent on botox procedure

## 2019-09-21 ENCOUNTER — Telehealth: Payer: Self-pay

## 2019-09-21 NOTE — Telephone Encounter (Signed)
Order sent to GI to schedule. Patient can call them at [phone] for appointment status.

## 2019-09-23 ENCOUNTER — Telehealth: Payer: Self-pay | Admitting: *Deleted

## 2019-09-23 ENCOUNTER — Ambulatory Visit: Payer: Medicare Other | Admitting: Neurology

## 2019-09-23 ENCOUNTER — Encounter: Payer: Self-pay | Admitting: Psychology

## 2019-09-23 NOTE — Telephone Encounter (Signed)
Received pt's [id number] application for [employer] safety net foundation on 09/16/2019. Physician portion completed and signed by Dr. Jaynee Eagles. I spoke with the wife today and she stated there have been no insurance changes. Insurance info copied on application from previous application. I faxed this to the [place]. Case # [id number]. Received a receipt of confirmation. I also tried to proactively complete a PA on CMM but it was canceled d/t already having PA approval on file through 10/30/2019. Pt's wife aware we will have to wait until insurance accepts 2021 PAs.

## 2019-10-06 ENCOUNTER — Ambulatory Visit (INDEPENDENT_AMBULATORY_CARE_PROVIDER_SITE_OTHER): Payer: Medicare Other | Admitting: Neurology

## 2019-10-06 ENCOUNTER — Encounter: Payer: Self-pay | Admitting: Neurology

## 2019-10-06 ENCOUNTER — Encounter: Payer: Self-pay | Admitting: *Deleted

## 2019-10-06 ENCOUNTER — Other Ambulatory Visit: Payer: Self-pay

## 2019-10-06 VITALS — BP 114/73 | HR 68 | Temp 97.4°F | Ht 72.0 in | Wt 240.0 lb

## 2019-10-06 DIAGNOSIS — I639 Cerebral infarction, unspecified: Secondary | ICD-10-CM

## 2019-10-06 DIAGNOSIS — M7918 Myalgia, other site: Secondary | ICD-10-CM

## 2019-10-06 DIAGNOSIS — H9190 Unspecified hearing loss, unspecified ear: Secondary | ICD-10-CM | POA: Diagnosis not present

## 2019-10-06 DIAGNOSIS — M542 Cervicalgia: Secondary | ICD-10-CM | POA: Diagnosis not present

## 2019-10-06 DIAGNOSIS — R413 Other amnesia: Secondary | ICD-10-CM | POA: Diagnosis not present

## 2019-10-06 DIAGNOSIS — I10 Essential (primary) hypertension: Secondary | ICD-10-CM | POA: Diagnosis not present

## 2019-10-06 DIAGNOSIS — G43711 Chronic migraine without aura, intractable, with status migrainosus: Secondary | ICD-10-CM | POA: Diagnosis not present

## 2019-10-06 DIAGNOSIS — G4733 Obstructive sleep apnea (adult) (pediatric): Secondary | ICD-10-CM | POA: Diagnosis not present

## 2019-10-06 DIAGNOSIS — R519 Headache, unspecified: Secondary | ICD-10-CM

## 2019-10-06 DIAGNOSIS — J449 Chronic obstructive pulmonary disease, unspecified: Secondary | ICD-10-CM

## 2019-10-06 NOTE — Telephone Encounter (Signed)
Completed renewal PA for [employer] on CMM. Key: [id number]. Determination letter will need to be faxed to [place] for their records to support pt's [id number] application.

## 2019-10-06 NOTE — Telephone Encounter (Signed)
Opened in error

## 2019-10-06 NOTE — Progress Notes (Signed)
[place]    Provider:  Larey Seat, MD  Primary Care Physician:  Holland Commons, [address] [place] [address]     Referring Provider: Sarina Ill, MD         Chief Complaint according to patient   Patient presents with:    . New Patient (Initial Visit)     pt states that he has never had any follow up care with the machine he currently has. last sleep study was years ago. current machine was set up through [facility], set up date was 01/2016. Wants to know if the machine is working like it is suppose to.      HISTORY OF PRESENT ILLNESS:  Mitchell Baxter is a 68 y.o. year old  Caucasian male patient seen on 10/06/2019 for a CPAP check up.  The machine was ordered through NP [facility] in office with Dr. Jani Gravel.    Chief concern according to patient :  I have migraines and Dr Jaynee Eagles wonders if my sleep apnea is well treated.    I have the pleasure of seeing Mitchell Baxter today, a right -handed White or Caucasian male with OSA on CPAP since 2017.He  has a past medical history of COPD (chronic obstructive pulmonary disease) ([place]), Depression, GERD (gastroesophageal reflux disease), Headache(784.0), Hyperlipidemia, Hypertension, Shortness of breath, Sleep apnea, and Vertigo.Marland Kitchen He reports subjective memory loss. Chronic cough.    The patient had the first sleep study in the year 74 17 through Dr. Julianne Rice office.     Family medical /sleep history: No other family member on CPAP with OSA, nobody with  insomnia, no sleep walkers.    Social history:  Patient is working as semi- retired from Consulting civil engineer and lives in a household with 2 persons, his spouse and him. The patient currently works part time.Pets are present, a dog. Tobacco use: current  Cigarette smoker.  ETOH use : socially- every other week end. Caffeine intake in form of Coffee(  4 cups ) Soda( yes) Tea ( rare) no energy drinks. Regular exercise- none         Sleep habits are  as follows: The patient's dinner time is between 6 PM. The patient goes to bed at 10 PM and continues to sleep for several hours, wakes rarely for bathroom breaks,but he smokes and drinks coffee a couple of times each night.    The preferred sleep position is sideways, with the support of 1 pillow. Dreams are reportedly frequent/vivid.  7.30 AM is the usual rise time.  The patient wakes up spontaneously.  He reports not feeling refreshed or restored in AM, with symptoms such as dry mouth in spite of CPAP, morning headaches - but is not woken by headaches. Lot's of residual fatigue.  Naps are taken infrequently, lasting 90 minutes and are less refreshing than nocturnal sleep.    Review of Systems: Out of a complete 14 system review, the patient complains of only the following symptoms, and all other reviewed systems are negative.:  Fatigue, sleepiness ,  Snoring- not when on CPAP , fragmented sleep, Insomnia -  He is in chronic pain.    How likely are you to doze in the following situations: 0 = not likely, 1 = slight chance, 2 = moderate chance, 3 = high chance   Sitting and Reading? Watching Television? Sitting inactive in a public place (theater or meeting)? As a passenger in a car for an hour  without a break? Lying down in the afternoon when circumstances permit? Sitting and talking to someone? Sitting quietly after lunch without alcohol? In a car, while stopped for a few minutes in traffic?   Total =9/ 24 points   FSS endorsed at 49/ 63 points. GDS 4/ 15 points    Social History   Socioeconomic History  . Marital status: Married    Spouse name: Jan  . Number of children: 2  . Years of education: 61  . Highest education level: Not on file  Occupational History  . Occupation: Self-employed  Social Needs  . Financial resource strain: Not on file  . Food insecurity    Worry: Not on file    Inability: Not on file  . Transportation needs    Medical: Not on file     Non-medical: Not on file  Tobacco Use  . Smoking status: Current Every Day Smoker    Packs/day: 1.00    Years: 40.00    Pack years: 40.00    Types: Cigarettes  . Smokeless tobacco: Never Used  Substance and Sexual Activity  . Alcohol use: Yes    Comment: occasional  . Drug use: No  . Sexual activity: Not on file  Lifestyle  . Physical activity    Days per week: Not on file    Minutes per session: Not on file  . Stress: Not on file  Relationships  . Social Herbalist on phone: Not on file    Gets together: Not on file    Attends religious service: Not on file    Active member of club or organization: Not on file    Attends meetings of clubs or organizations: Not on file    Relationship status: Not on file  Other Topics Concern  . Not on file  Social History Narrative   Lives at home with his wife.   Caffeine use: 2-3 cups daily of coffee/soda    Family History  Problem Relation Age of Onset  . Colon cancer Father        pancreatic  . Anesthesia problems Neg Hx   . Hypotension Neg Hx   . Malignant hyperthermia Neg Hx   . Pseudochol deficiency Neg Hx   . Migraines Neg Hx     Past Medical History:  Diagnosis Date  . COPD (chronic obstructive pulmonary disease) ([place])   . Depression   . GERD (gastroesophageal reflux disease)   . Headache(784.0)    migraine  . Hyperlipidemia   . Hypertension   . Shortness of breath    exertion  . Sleep apnea    stopbang=4  . Vertigo     Past Surgical History:  Procedure Laterality Date  . BRAVO [place] STUDY  11/29/2011   Procedure: BRAVO [place];  Surgeon: Landry Dyke, MD;  Location: [facility];  Service: Endoscopy;  Laterality: N/A;  . COLONOSCOPY  10/03/2012   Procedure: COLONOSCOPY;  Surgeon: Shann Medal, MD;  Location: [facility];  Service: General;  Laterality: N/A;  carol/leone  . HIATAL HERNIA REPAIR  04/16/2012   Procedure: LAPAROSCOPIC REPAIR OF HIATAL HERNIA;  Surgeon: Pedro Earls, MD;   Location: [facility];  Service: General;;  . LAPAROSCOPIC NISSEN FUNDOPLICATION  3/32/9518   Procedure: LAPAROSCOPIC NISSEN FUNDOPLICATION;  Surgeon: Pedro Earls, MD;  Location: [facility];  Service: General;  Laterality: N/A;  . none       Current Outpatient Medications on File Prior to Visit  Medication  Sig Dispense Refill  . atenolol (TENORMIN) 25 MG tablet Take 1 tablet (25 mg total) by mouth daily. (Patient not taking: Reported on 10/06/2019) 30 tablet 11  . atorvastatin (LIPITOR) 40 MG tablet Take 40 mg by mouth daily.    Eduard Roux (AIMOVIG) 140 MG/ML SOAJ Inject 140 mg into the skin every 30 (thirty) days. (Patient not taking: Reported on 10/06/2019) 1 pen 11  . FLUoxetine (PROZAC) 40 MG capsule Take 40 mg by mouth every evening.     . gabapentin (NEURONTIN) 300 MG capsule Take 300 mg by mouth every morning.  6  . methylPREDNISolone (MEDROL DOSEPAK) 4 MG TBPK tablet Take pills in the morning each day with food (Patient not taking: Reported on 10/06/2019) 21 tablet 1  . Probiotic Product (PROBIOTIC PO) Take 1 tablet by mouth daily.    . rizatriptan (MAXALT-MLT) 10 MG disintegrating tablet Take 1 tablet (10 mg total) by mouth as needed for migraine. May repeat in 2 hours if needed. Max 2x a day (Patient not taking: Reported on 10/06/2019) 9 tablet 11  . topiramate ER (QUDEXY XR) 200 MG CS24 sprinkle capsule Take 1 capsule (200 mg total) by mouth at bedtime. (Patient not taking: Reported on 10/06/2019) 30 capsule 5  . valsartan-hydrochlorothiazide (DIOVAN-HCT) 160-12.5 MG tablet TAKE 1/2 TABLET BY MOUTH ONCE A DAY     No current facility-administered medications on file prior to visit.     No Known Allergies  Physical exam:  Today's Vitals   10/06/19 0855  BP: 114/73  Pulse: 68  Temp: (!) 97.4 F (36.3 C)  Weight: 240 lb (108.9 kg)  Height: 6' (1.829 m)   Body mass index is 32.55 kg/m.   Wt Readings from Last 3 Encounters:  10/06/19 240 lb (108.9 kg)  05/06/18 219 lb 12.8 oz  (99.7 kg)  10/25/17 227 lb 9.6 oz (103.2 kg)     Ht Readings from Last 3 Encounters:  10/06/19 6' (1.829 m)  05/06/18 6' (1.829 m)  10/25/17 6' (1.829 m)      General: The patient is awake, alert and appears not in acute distress. The patient appears older than his numeric age. Head: Normocephalic, atraumatic. Neck is supple. Mallampati 3-4,  neck circumference:17 inches . Nasal airflow is barely patent.  Retrognathia is not seen.   lacquered tongue/ Glossitis ?Marland Kitchen  Dental status:  Cardiovascular:  Regular rate and cardiac rhythm by pulse,  without distended neck veins. Respiratory: Lungs are clear to auscultation.  Skin:  Without evidence of ankle edema, or rash. Trunk: The patient's posture is erect.   Neurologic exam : The patient is awake and alert, oriented to place and time.   Memory subjective described as intact.  Attention span & concentration ability appears normal.  Speech is fluent,  without  dysarthria,mild  dysphonia or aphasia.  Mood and affect are depressed.   Cranial nerves: no loss of smell or taste reported  Pupils are equal and briskly reactive to light. Funduscopic exam deferred.  Extraocular movements in vertical and horizontal planes were intact and without nystagmus. No Diplopia. Visual fields by finger perimetry are intact. Hearing was impaired to soft voice and finger rubbing.    Facial sensation intact to fine touch.  Facial motor strength is symmetric and tongue and uvula move midline.  Neck ROM : rotation, tilt and flexion extension were normal for age and shoulder shrug was symmetrical.    Motor exam:  Symmetric bulk, tone and ROM.   Normal tone without cog wheeling,  asymmetric grip strength- right stronger than left  .   Sensory:  Fine touch, pinprick and vibration were  He reports legs, feet and back are hurting. Left ankle has no longer  Intact vibration sensation.  Proprioception tested in the upper extremities was normal.   Coordination:  Rapid alternating movements in the fingers/hands were of normal speed.  The Finger-to-nose maneuver was intact without evidence of ataxia, dysmetria or tremor.   Gait and station: Patient could rise unassisted from a seated position, walked without assistive device.  He has had no back surgeries.  Stance is of normal width/ base and the patient turned with 3 steps.  Toe and heel walk were deferred.  Deep tendon reflexes: in the upper extremities are symmetric and intact.  I was unable to elicit any patella or achilles tendon reflexes.  Babinski response was deferred.      Review: CPAP download-I have the pleasure of seeing Mr. Mitchell Baxter today a 68 year old Caucasian gentleman who has used his CPAP for over 3 years now.  Average user time for the last 30 days was 8 hours and 8 minutes, use the machine 28 out of 30 days equal to 93% compliance.  The minimum pressure for the machine is set at 5 and a maximum of 15 cmH2O was 2 cm EPR achieving a residual AHI of only 1.5 apneas and hypopneas per hour.  This is very good resolution of apnea also I do not know the baseline.  He does have a 14.4 cm 95th percentile pressure and I think he would benefit from an attempt but more pressure on the upper limits.  Air leaks are moderate.  He is using an air sense 10 AutoSet with a serial #[id number].   After spending a total time of 30 minutes face to face and additional time for physical and neurologic examination, review of laboratory studies,  personal review of imaging studies, reports and results of other testing and review of referral information / records as far as provided in visit, I have established the following assessments:  1)  Mrs. Cuccaro believes this machine is older than 5 years-  I will inquire through [facility].  2) He has been highly compliant and achieved good control of OSA. residual AHI 1.5/h. I will ask [facility] to reset to 17 cm water max. I also order an ONO whil eon CPAP to see if  hypoxia is present.   3) main reason for insomnia is pain - not apnea.    My Plan is to proceed with:  1) ONO and increasing pressure on CPAP. If the device is indeed over 59 years old, will repeat HST/ PSG and order a new one. RV with NP in 2 month. Please address smoking cessation !   I would like to thank Dr Jaynee Eagles for allowing me to meet with and to take care of this pleasant patient.   In short, Mitchell Baxter is presenting with chronic migraine, chronic neck and back and lower extremity pain and OSA on CPAP , also memory loss.   I plan to follow up either personally or through our NP within 2 month.   CC: I will share my notes with PCP  Electronically signed by: Larey Seat, MD 10/06/2019 9:13 AM  [facility] Board certified by The [employer] of Sleep Medicine and Diplomate of the [employer] of Sleep Medicine. Board certified In Neurology through the [place], Fellow of the [employer] of  Neurology. Medical Director of [facility].

## 2019-10-06 NOTE — Telephone Encounter (Signed)
Per CMM: Request Reference Number: [id number]. AIMOVIG INJ 140MG /ML is approved through 10/29/2020. For further questions, call [phone].   I faxed a copy of the notification to [employer]. Received a receipt of confirmation.  I also called the pt's wife and updated her.

## 2019-10-13 NOTE — Progress Notes (Signed)
Patient referred by Holland Commons, FNP for coronary and aortic atherosclerosis  Subjective:   Mitchell Baxter, male    DOB: April 18, 1951, 68 y.o.   MRN: [id number]   Chief Complaint  Patient presents with  . Coronary Artery Disease  . Hypertension  . New Patient (Initial Visit)     HPI  68 y.o. African American male with hypertension, tobacco dependence, coronary and aortic atherosclerosis.  Patient recently underwent lung cancer screening CT scan that showed coronary and aortic atherosclerosis. Patient was last seen by Dr.Vyas in April 2017.  Work-up at that time for syncope included unremarkable echocardiogram normal Lexiscan nuclear stress test.  Patient is here today with his wife.  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  His work involves physical labor.  He does have exertional dyspnea which is relatively unchanged.  He has episodes of retrosternal chest pressure, that occur 3-4 times a week, and last for several hours.  Symptoms are not particularly worse with exertion.  He endorses pain in his bilateral calves on walking, improved with rest. On a separate note, he has episodes of sudden shaking, eyes rolling up, lasting for up to 10 minutes.  During these episodes, he is not very responsive, according to his wife.  Following these episodes, he is fatigued and tired rest of the day.  He denies loss of consciousness, tongue bite, loss of bowel or bladder control.  Patient currently smokes 1-2 packs/day.  He has been given a prescription of Chantix, but he has not used it yet.  Past Medical History:  Diagnosis Date  . COPD (chronic obstructive pulmonary disease) ([place])   . Depression   . GERD (gastroesophageal reflux disease)   . Headache(784.0)    migraine  . Hyperlipidemia   . Hypertension   . Shortness of breath    exertion  . Sleep apnea    stopbang=4  . Vertigo      Past Surgical History:  Procedure Laterality Date    . BRAVO [place] STUDY  11/29/2011   Procedure: BRAVO [place];  Surgeon: Landry Dyke, MD;  Location: [facility];  Service: Endoscopy;  Laterality: N/A;  . COLONOSCOPY  10/03/2012   Procedure: COLONOSCOPY;  Surgeon: Shann Medal, MD;  Location: [facility];  Service: General;  Laterality: N/A;  carol/leone  . HIATAL HERNIA REPAIR  04/16/2012   Procedure: LAPAROSCOPIC REPAIR OF HIATAL HERNIA;  Surgeon: Pedro Earls, MD;  [facility];  Service: General;;  . LAPAROSCOPIC NISSEN FUNDOPLICATION  4/33/2951   Procedure: LAPAROSCOPIC NISSEN FUNDOPLICATION;  Surgeon: Pedro Earls, MD;  Location: [facility];  Service: General;  Laterality: N/A;  . none       Social History   Tobacco Use  Smoking Status Current Every Day Smoker  . Packs/day: 1.00  . Years: 40.00  . Pack years: 40.00  . Types: Cigarettes  Smokeless Tobacco Never Used    Social History   Substance and Sexual Activity  Alcohol Use Yes   Comment: occasional     Family History  Problem Relation Age of Onset  . Colon cancer Father        pancreatic  . Anesthesia problems Neg Hx   . Hypotension Neg Hx   . Malignant hyperthermia Neg Hx   . Pseudochol deficiency Neg Hx   . Migraines Neg Hx      Current Outpatient Medications on File Prior to Visit  Medication Sig Dispense Refill  .  atenolol (TENORMIN) 25 MG tablet Take 1 tablet (25 mg total) by mouth daily. 30 tablet 11  . atorvastatin (LIPITOR) 40 MG tablet Take 40 mg by mouth daily.    Eduard Roux (AIMOVIG) 140 MG/ML SOAJ Inject 140 mg into the skin every 30 (thirty) days. 1 pen 11  . FLUoxetine (PROZAC) 40 MG capsule Take 40 mg by mouth every evening.     . gabapentin (NEURONTIN) 300 MG capsule Take 300 mg by mouth every morning.  6  . MELATONIN PO Take 10 mg by mouth at bedtime.    . rizatriptan (MAXALT-MLT) 10 MG disintegrating tablet Take 1 tablet (10 mg total) by mouth as needed for migraine. May repeat in 2 hours if needed. Max 2x a day 9 tablet  11  . topiramate ER (QUDEXY XR) 200 MG CS24 sprinkle capsule Take 1 capsule (200 mg total) by mouth at bedtime. 30 capsule 5  . valsartan-hydrochlorothiazide (DIOVAN-HCT) 160-12.5 MG tablet TAKE 1/2 TABLET BY MOUTH ONCE A DAY     No current facility-administered medications on file prior to visit.    Cardiovascular and other pertinent studies:  EKG 08/20/2019: Sinus rhythm 61 bpm.  Normal EKG.  Recent labs: 08/13/2019: Glucose 99, BUN/Cr 13/1.2. EGFR 85. Na/K 138/4.2. Rest of the CMP normal H/H 16/447. MCV 95. Platelets 202 Chol 159, TG 91, HDL 45, LDL 96 TSH normal  CT Chest 09/12/2019: 1. Lung-RADS 2, benign appearance or behavior. Continue annual screening with low-dose chest CT without contrast in 12 months. 2. Aortic atherosclerosis (ICD10-I70.0), coronary artery atherosclerosis and emphysema (ICD10-J43.9).      Review of Systems  Constitution: Negative for decreased appetite, malaise/fatigue, weight gain and weight loss.  HENT: Negative for congestion.   Eyes: Negative for visual disturbance.  Cardiovascular: Positive for chest pain, claudication and dyspnea on exertion. Negative for leg swelling, palpitations and syncope.  Respiratory: Positive for shortness of breath. Negative for cough.   Endocrine: Negative for cold intolerance.  Hematologic/Lymphatic: Does not bruise/bleed easily.  Skin: Negative for itching and rash.  Musculoskeletal: Negative for myalgias.  Gastrointestinal: Negative for abdominal pain, nausea and vomiting.  Genitourinary: Negative for dysuria.  Neurological: Negative for dizziness and weakness.  Psychiatric/Behavioral: The patient is not nervous/anxious.   All other systems reviewed and are negative.        Vitals:   10/15/19 1118  BP: 137/78  Pulse: 63  Temp: 97.6 F (36.4 C)  SpO2: 94%     Body mass index is 32.55 kg/m. Filed Weights   10/15/19 1118  Weight: 240 lb (108.9 kg)     Objective:   Physical Exam    Constitutional: He is oriented to person, place, and time. He appears well-developed and well-nourished. No distress.  HENT:  Head: Normocephalic and atraumatic.  Eyes: Pupils are equal, round, and reactive to light. Conjunctivae are normal.  Neck: No JVD present.  Cardiovascular: Normal rate, regular rhythm and intact distal pulses.  No murmur heard. Pulmonary/Chest: Effort normal and breath sounds normal. He has no wheezes. He has no rales.  Abdominal: Soft. Bowel sounds are normal. There is no rebound.  Musculoskeletal:        General: No edema.  Lymphadenopathy:    He has no cervical adenopathy.  Neurological: He is alert and oriented to person, place, and time. No cranial nerve deficit.  Skin: Skin is warm and dry.  Psychiatric: He has a normal mood and affect.  Nursing note and vitals reviewed.  Assessment & Recommendations:   68 y.o. African American male with hypertension, tobacco dependence, coronary and aortic atherosclerosis, with exertional dyspnea, atypical chest pain.  Atypical chest pain: Chest pain is atypical, he has known coronary atherosclerosis seen on CT scan, hypertension, tobacco dependence. Due to his exertional dyspnea and claudication, recommend pharmacological nuclear stress test.  Exertional dyspnea: Mild emphysema most likely etiology, will obtain echocardiogram to rule out cardiac etiology.  Consider Rosanne Gutting function test.  Coronary and aortic atherosclerosis: Tests, as above.  Recommend aspirin 81 mg, continue Lipitor 40 mg daily.  Claudication: Recommend ABI  Episodes of shaking: I am concerned that he may have seizure episodes.  Strongly recommend considering this in the differential.  Consider neurology referral.  Tobacco dependence: Tobacco cessation counseling:   - Currently smoking 1-2 packs/day   - Patient was informed of the dangers of tobacco abuse including stroke, cancer, and MI, as well as benefits of tobacco  cessation. - Patient is willing to quit at this time. - Approximately 5 mins were spent counseling patient cessation techniques. We discussed various methods to help quit smoking, including deciding on a date to quit, joining a support group, pharmacological agents. Patient would like to use chantix, already has a prescription. - I will reassess his progress at the next follow-up visit      Thank you for referring the patient to [place]. Please feel free to contact with any questions.  Nigel Mormon, MD [facility] Pager: [phone] Office: [phone]

## 2019-10-15 ENCOUNTER — Ambulatory Visit: Payer: Medicare Other | Attending: Neurology | Admitting: Physical Therapy

## 2019-10-15 ENCOUNTER — Encounter: Payer: Self-pay | Admitting: Physical Therapy

## 2019-10-15 ENCOUNTER — Ambulatory Visit (INDEPENDENT_AMBULATORY_CARE_PROVIDER_SITE_OTHER): Payer: Medicare Other | Admitting: Cardiology

## 2019-10-15 ENCOUNTER — Other Ambulatory Visit: Payer: Self-pay

## 2019-10-15 ENCOUNTER — Encounter: Payer: Self-pay | Admitting: Cardiology

## 2019-10-15 VITALS — BP 137/78 | HR 63 | Temp 97.6°F | Ht 72.0 in | Wt 240.0 lb

## 2019-10-15 DIAGNOSIS — R293 Abnormal posture: Secondary | ICD-10-CM | POA: Diagnosis present

## 2019-10-15 DIAGNOSIS — R0609 Other forms of dyspnea: Secondary | ICD-10-CM | POA: Diagnosis not present

## 2019-10-15 DIAGNOSIS — I2510 Atherosclerotic heart disease of native coronary artery without angina pectoris: Secondary | ICD-10-CM | POA: Diagnosis not present

## 2019-10-15 DIAGNOSIS — I700 Atherosclerosis of aorta: Secondary | ICD-10-CM | POA: Diagnosis not present

## 2019-10-15 DIAGNOSIS — F17210 Nicotine dependence, cigarettes, uncomplicated: Secondary | ICD-10-CM

## 2019-10-15 DIAGNOSIS — M542 Cervicalgia: Secondary | ICD-10-CM | POA: Diagnosis present

## 2019-10-15 DIAGNOSIS — F17200 Nicotine dependence, unspecified, uncomplicated: Secondary | ICD-10-CM

## 2019-10-15 DIAGNOSIS — I10 Essential (primary) hypertension: Secondary | ICD-10-CM | POA: Diagnosis not present

## 2019-10-15 DIAGNOSIS — R0789 Other chest pain: Secondary | ICD-10-CM | POA: Diagnosis not present

## 2019-10-15 DIAGNOSIS — I25118 Atherosclerotic heart disease of native coronary artery with other forms of angina pectoris: Secondary | ICD-10-CM | POA: Insufficient documentation

## 2019-10-15 DIAGNOSIS — I739 Peripheral vascular disease, unspecified: Secondary | ICD-10-CM | POA: Diagnosis not present

## 2019-10-15 MED ORDER — ASPIRIN EC 81 MG PO TBEC: 81.0000 mg | DELAYED_RELEASE_TABLET | Freq: Every day | ORAL | 3 refills | Status: AC

## 2019-10-15 NOTE — Patient Instructions (Signed)
Access Code: [email or web address]  URL: [email or web address]  Date: 10/15/2019  Prepared by: Hilda Blades   Exercises Seated Cervical Retraction - 10 reps - 1-2 seconds hold - 2x daily - 7x weekly Seated Thoracic Lumbar Extension with Pectoralis Stretch - 10 reps - 2x daily - 7x weekly Banded Row - 10 reps - 2 sets - 1x daily - 7x weekly Shoulder Extension with Band - 10 reps - 2 sets - 1x daily - 7x weekly Shoulder External Rotation and Scapular Retraction with Resistance - 10 reps - 2 sets - 1x daily - 7x weekly Standing Shoulder Horizontal Abduction with Resistance - 10 reps - 2 sets - 1x daily - 7x weekly

## 2019-10-16 NOTE — Therapy (Signed)
[place], [place], [address] Phone: [phone]   Fax:  [phone]  Physical Therapy Evaluation  Patient Details  Name: Mitchell Baxter MRN: [id number] Date of Birth: 07/18/1951 Referring Provider (PT): Melvenia Beam, MD   Encounter Date: 10/15/2019  PT End of Session - 10/16/19 0819    Visit Number  1    Number of Visits  6    Date for PT Re-Evaluation  11/26/19    Authorization Type  MCR A&B    PT Start Time  6659    PT Stop Time  1530    PT Time Calculation (min)  225 min    Activity Tolerance  Patient tolerated treatment well    Behavior During Therapy  [facility] for tasks assessed/performed       Past Medical History:  Diagnosis Date  . Chronic migraine w/o aura, not intractable, w stat migr   . COPD (chronic obstructive pulmonary disease) ([place])   . Depression   . GERD (gastroesophageal reflux disease)   . Headache(784.0)    migraine  . Hyperlipidemia   . Hypertension   . Shortness of breath    exertion  . Sleep apnea    stopbang=4  . Vertigo     Past Surgical History:  Procedure Laterality Date  . BRAVO [place] STUDY  11/29/2011   Procedure: BRAVO [place];  Surgeon: Landry Dyke, MD;  [facility];  Service: Endoscopy;  Laterality: N/A;  . COLONOSCOPY  10/03/2012   Procedure: COLONOSCOPY;  Surgeon: Shann Medal, MD;  Location: [facility];  Service: General;  Laterality: N/A;  carol/leone  . HIATAL HERNIA REPAIR  04/16/2012   Procedure: LAPAROSCOPIC REPAIR OF HIATAL HERNIA;  Surgeon: Pedro Earls, MD;  [facility]: [facility];  Service: General;;  . LAPAROSCOPIC NISSEN FUNDOPLICATION  9/35/7017   Procedure: LAPAROSCOPIC NISSEN FUNDOPLICATION;  Surgeon: Pedro Earls, MD;  Location: [facility];  Service: General;  Laterality: N/A;  . none      There were no vitals filed for this visit.   Subjective Assessment - 10/15/19 1448    Subjective  Patient reports chronic right  sided neck pain over past few years. He wakes up with pain and it is constant throughout the day. He states that his pain remains constantly high without any agg or ease factors. He has had PT in the past without much relief and he feels stretching aggravates his neck pain. He denies any N/T or referred pain.    Pertinent History  Migraines, depression, HTN, COPD    Limitations  Sitting;Lifting;House hold activities    How long can you sit comfortably?  Most sitting is painful    How long can you stand comfortably?  Unlimited    How long can you walk comfortably?  Unlimited    Diagnostic tests  X-ray    Patient Stated Goals  Get rid of right sided neck pain.    Currently in Pain?  Yes    Pain Score  8     Pain Location  Neck    Pain Orientation  Right    Pain Descriptors / Indicators  Aching;Sharp;Tightness    Pain Type  Chronic pain    Pain Radiating Towards  Right sided neck, upper trap    Pain Onset  More than a month ago    Pain Frequency  Constant    Aggravating Factors   Everything    Pain Relieving Factors  Nothing  Effect of Pain on Daily Activities  Patient reports pain brings on migraines and then he doesn't want to do anything         [facility] PT Assessment - 10/16/19 0001      Assessment   Medical Diagnosis  Chronic right sided neck pain    Referring Provider (PT)  Melvenia Beam, MD    Onset Date/Surgical Date  --   patient reports pain for many years   Hand Dominance  Right    Next MD Visit  12/30/2018    Prior Therapy  Yes - in 2018 for neck pain      Precautions   Precautions  None      Restrictions   Weight Bearing Restrictions  No      Balance Screen   Has the patient fallen in the past 6 months  No    Has the patient had a decrease in activity level because of a fear of falling?   No    Is the patient reluctant to leave their home because of a fear of falling?   No      Home Film/video editor residence      Prior Function    Level of Independence  Independent    Vocation  Part time employment    Vocation Requirements  Working in Architect    Leisure  None      Cognition   Overall Cognitive Status  Within Functional Limits for tasks assessed      Observation/Other Assessments   Observations  Patient appears in no apparent distress    Focus on Therapeutic Outcomes (FOTO)   37% limitation      Sensation   Light Touch  Appears Intact      Posture/Postural Control   Posture Comments  Patient exhibits forward head posture, rounded shoulders, slouched      ROM / Strength   AROM / PROM / Strength  AROM;Strength      AROM   Overall AROM Comments  Shoulder AROM grossly WFL    AROM Assessment Site  Shoulder;Cervical    Cervical Flexion  45    Cervical Extension  40    Cervical - Right Side Bend  35    Cervical - Left Side Bend  35    Cervical - Right Rotation  60    Cervical - Left Rotation  60      Strength   Overall Strength Comments  Periscapular strength grossly 4-/5 MMT bilateral    Strength Assessment Site  Shoulder    Right/Left Shoulder  Right;Left    Right Shoulder Flexion  5/5    Right Shoulder Extension  4+/5    Right Shoulder ABduction  4+/5    Right Shoulder Internal Rotation  5/5    Right Shoulder External Rotation  4+/5    Right Shoulder Horizontal ABduction  4/5    Left Shoulder Flexion  5/5    Left Shoulder Extension  4+/5    Left Shoulder ABduction  4+/5    Left Shoulder Internal Rotation  5/5    Left Shoulder External Rotation  4+/5    Left Shoulder Horizontal ABduction  4/5      Flexibility   Soft Tissue Assessment /Muscle Length  --   Bilateral upper trap tightness, levator, suboccipital     Palpation   Palpation comment  TTP along right cerivcal paraspinals, right suboccipital region, right upper trap more along the cervical spine  Transfers   Transfers  Independent with all Transfers                Objective measurements completed on examination: See  above findings.      [facility] Adult PT Treatment/Exercise - 10/16/19 0001      Exercises   Exercises  Neck      Neck Exercises: Theraband   Shoulder Extension  10 reps;Red    Rows  10 reps;Red    Shoulder External Rotation  10 reps;Red    Horizontal ABduction  10 reps;Red      Neck Exercises: Seated   Neck Retraction  10 reps;3 secs    Other Seated Exercise  Thoracic extension with pec stretch hands behind neck x10             PT Education - 10/15/19 1442    Education Details  Exam findings, POC, dry needling, posture, HEP    Person(s) Educated  Patient    Methods  Explanation;Demonstration;Tactile cues;Verbal cues;Handout    Comprehension  Verbalized understanding;Returned demonstration;Verbal cues required;Tactile cues required;Need further instruction       PT Short Term Goals - 10/15/19 1443      PT SHORT TERM GOAL #1   Title  Patient will be independent with HEP to progress in PT    Time  4    Period  Weeks    Status  New    Target Date  11/12/19      PT SHORT TERM GOAL #2   Title  Patient will report resting pain level of < or = 6/10 pain level.    Baseline  8/10    Time  4    Period  Weeks    Status  New    Target Date  11/12/19        PT Long Term Goals - 10/16/19 0814      PT LONG TERM GOAL #1   Title  Patient will report improved functional level of < or = 32% FOTO to reduce limitation in daily activities.    Baseline  37% limitation    Time  6    Period  Weeks    Status  New    Target Date  11/26/19      PT LONG TERM GOAL #2   Title  Patient will exhibit improve side bending to 45 deg to indicate reduce muscle tightness and reduce pain.    Baseline  35 deg    Time  6    Period  Weeks    Status  New    Target Date  11/26/19      PT LONG TERM GOAL #3   Title  Patient will report resting neck pain of < or = 3/10 to allow for improved functional level and less migraines.    Time  6    Period  Weeks    Status  New    Target Date   11/26/19      PT LONG TERM GOAL #4   Title  Patient will exhibit improve periscapular strength of grossly 4+/5 MMT to allow for improved postural control.    Time  6    Period  Weeks    Status  New    Target Date  11/26/19             Plan - 10/15/19 1741    Clinical Impression Statement  Patient presents to PT with chronic right sided neck pain that seems musculoskeletal in nature.  He presents with no radicular symptoms but does report neck pain will result in headaches. He does exhibit good cervical range of motion and reports constant pain with all movement, he exhibits limited periscapular and postural control with postural deviations that are most likely contributing to his pain. He was provided with exercises to promote improved posture and he was not given any stretching because he stated this aggravates his neck. He would benefit from skilled PT to improve his pain with dry needling to improve muscular pain in the neck.    Personal Factors and Comorbidities  Fitness;Past/Current Experience;Social Background;Time since onset of injury/illness/exacerbation    Examination-Activity Limitations  Sit;Carry;Lift    Examination-Participation Restrictions  Cleaning;Laundry;Yard Work;Community Activity    Stability/Clinical Decision Making  Evolving/Moderate complexity    Clinical Decision Making  Moderate    Rehab Potential  Good    PT Frequency  1x / week    PT Duration  6 weeks    PT Treatment/Interventions  Cryotherapy;Electrical Stimulation;Moist Heat;Traction;Therapeutic exercise;Patient/family education;Manual techniques;Dry needling;Passive range of motion;Taping;Spinal Manipulations;Joint Manipulations    PT Next Visit Plan  Dry needling right suboccipital region, upper trap, levator, cervical paraspinals; assess HEP, progress postural exercises    PT Home Exercise Plan  Seated chin tucks, thoracic extension with pec stretch over back of chair, banded row, extension, ER, horiz  abduction with red band    Consulted and Agree with Plan of Care  Patient       Patient will benefit from skilled therapeutic intervention in order to improve the following deficits and impairments:  Postural dysfunction, Decreased strength, Impaired flexibility, Decreased activity tolerance, Pain, Decreased range of motion  Visit Diagnosis: Cervicalgia  Abnormal posture     Problem List Patient Active Problem List   Diagnosis Date Noted  . Coronary artery disease involving native coronary artery of native heart without angina pectoris 10/15/2019  . Aortic atherosclerosis ([place]) 10/15/2019  . Tobacco dependence 10/15/2019  . Exertional dyspnea 10/15/2019  . Atypical chest pain 10/15/2019  . Claudication ([place]) 10/15/2019  . OSA and COPD overlap syndrome ([place]) 10/06/2019  . Intractable chronic migraine without aura and with status migrainosus 06/06/2017  . Plantar fasciitis 09/26/2016  . Altered mental status 08/25/2015  . Tremor of right hand 08/25/2015  . Vision changes 08/25/2015  . Worsening headaches 08/25/2015  . New onset of headaches after age 20 08/25/2015  . Sinusitis, chronic 08/25/2015  . Hearing loss 08/25/2015  . Vertigo 08/25/2015  . Lap [place] June 2013 05/24/2012  . GERD (gastroesophageal reflux disease) 03/28/2012  . Chronic headaches 12/22/2011  . Essential hypertension 12/22/2011  . High cholesterol 12/22/2011  . Depression 12/22/2011    Hilda Blades, PT, DPT, LAT, ATC 10/16/19  8:20 AM Phone: [phone] Fax: [place] [facility] [address] [place], [place], [address] Phone: [phone]   Fax:  [phone]  Name: Mitchell Baxter MRN: [id number] Date of Birth: 1951-08-28

## 2019-10-20 ENCOUNTER — Ambulatory Visit
Admission: RE | Admit: 2019-10-20 | Discharge: 2019-10-20 | Disposition: A | Discharge status: Home / Self Care | Payer: Medicare Other | Source: Ambulatory Visit | Attending: Neurology | Admitting: Neurology

## 2019-10-20 ENCOUNTER — Other Ambulatory Visit: Payer: Self-pay

## 2019-10-20 DIAGNOSIS — R29898 Other symptoms and signs involving the musculoskeletal system: Secondary | ICD-10-CM

## 2019-10-20 DIAGNOSIS — R200 Anesthesia of skin: Secondary | ICD-10-CM

## 2019-10-20 DIAGNOSIS — M542 Cervicalgia: Secondary | ICD-10-CM

## 2019-10-20 DIAGNOSIS — G8929 Other chronic pain: Secondary | ICD-10-CM

## 2019-10-20 MED ORDER — GADOBENATE DIMEGLUMINE 529 MG/ML IV SOLN
20.0000 mL | Freq: Once | INTRAVENOUS | Status: AC | PRN
  Administered 2019-10-20: 20 mL via INTRAVENOUS

## 2019-10-21 ENCOUNTER — Telehealth: Payer: Self-pay | Admitting: *Deleted

## 2019-10-21 NOTE — Telephone Encounter (Signed)
-----   Message from Penni Bombard, MD sent at 10/21/2019 12:26 PM EST ----- Mild degenerative changes. No major pinched nerves. Continue current plan. -VRP

## 2019-10-21 NOTE — Telephone Encounter (Signed)
Spoke with pt's wife Thayer Headings (on [place]). She understands the MRI c-spine results as reviewed by Dr. Leta Baptist. She verbalized appreciation.

## 2019-10-27 ENCOUNTER — Other Ambulatory Visit: Payer: Medicare Other

## 2019-10-29 ENCOUNTER — Ambulatory Visit (INDEPENDENT_AMBULATORY_CARE_PROVIDER_SITE_OTHER): Payer: Medicare Other

## 2019-10-29 ENCOUNTER — Other Ambulatory Visit: Payer: Self-pay

## 2019-10-29 DIAGNOSIS — R0789 Other chest pain: Secondary | ICD-10-CM | POA: Diagnosis not present

## 2019-10-29 DIAGNOSIS — I700 Atherosclerosis of aorta: Secondary | ICD-10-CM

## 2019-10-29 DIAGNOSIS — I739 Peripheral vascular disease, unspecified: Secondary | ICD-10-CM

## 2019-10-29 DIAGNOSIS — I2510 Atherosclerotic heart disease of native coronary artery without angina pectoris: Secondary | ICD-10-CM | POA: Diagnosis not present

## 2019-10-29 DIAGNOSIS — R0609 Other forms of dyspnea: Secondary | ICD-10-CM

## 2019-10-30 ENCOUNTER — Ambulatory Visit: Payer: Medicare Other | Admitting: Physical Therapy

## 2019-11-03 ENCOUNTER — Other Ambulatory Visit: Payer: Self-pay

## 2019-11-03 ENCOUNTER — Ambulatory Visit (INDEPENDENT_AMBULATORY_CARE_PROVIDER_SITE_OTHER): Payer: Medicare HMO

## 2019-11-03 DIAGNOSIS — I2510 Atherosclerotic heart disease of native coronary artery without angina pectoris: Secondary | ICD-10-CM | POA: Diagnosis not present

## 2019-11-03 DIAGNOSIS — R0609 Other forms of dyspnea: Secondary | ICD-10-CM

## 2019-11-03 DIAGNOSIS — R0789 Other chest pain: Secondary | ICD-10-CM | POA: Diagnosis not present

## 2019-11-04 ENCOUNTER — Ambulatory Visit: Payer: Medicare HMO | Attending: Neurology | Admitting: Physical Therapy

## 2019-11-04 ENCOUNTER — Encounter: Payer: Self-pay | Admitting: Physical Therapy

## 2019-11-04 DIAGNOSIS — M542 Cervicalgia: Secondary | ICD-10-CM | POA: Diagnosis not present

## 2019-11-04 DIAGNOSIS — R293 Abnormal posture: Secondary | ICD-10-CM | POA: Diagnosis present

## 2019-11-04 NOTE — Therapy (Signed)
[place], [place], [address] Phone: [phone]   Fax:  [phone]  Physical Therapy Treatment  Patient Details  Name: Mitchell Baxter MRN: [id number] Date of Birth: [birth year] Referring Provider (PT): Melvenia Beam, MD   Encounter Date: 11/04/2019  PT End of Session - 11/04/19 0930    Visit Number  2    Number of Visits  6    Date for PT Re-Evaluation  11/26/19    Authorization Type  MCR A&B    PT Start Time  0930    PT Stop Time  1018    PT Time Calculation (min)  48 min    Activity Tolerance  Patient tolerated treatment well    Behavior During Therapy  [facility] for tasks assessed/performed       Past Medical History:  Diagnosis Date  . Chronic migraine w/o aura, not intractable, w stat migr   . COPD (chronic obstructive pulmonary disease) ([place])   . Depression   . GERD (gastroesophageal reflux disease)   . Headache(784.0)    migraine  . Hyperlipidemia   . Hypertension   . Shortness of breath    exertion  . Sleep apnea    stopbang=4  . Vertigo     Past Surgical History:  Procedure Laterality Date  . BRAVO [place] STUDY  11/29/2011   Procedure: BRAVO [place];  Surgeon: Landry Dyke, MD;  [facility];  Service: Endoscopy;  Laterality: N/A;  . COLONOSCOPY  10/03/2012   Procedure: COLONOSCOPY;  Surgeon: Shann Medal, MD;  Location: [facility];  Service: General;  Laterality: N/A;  carol/leone  . HIATAL HERNIA REPAIR  04/16/2012   Procedure: LAPAROSCOPIC REPAIR OF HIATAL HERNIA;  Surgeon: Pedro Earls, MD;  [facility];  Service: General;;  . LAPAROSCOPIC NISSEN FUNDOPLICATION  06/16/2992   Procedure: LAPAROSCOPIC NISSEN FUNDOPLICATION;  Surgeon: Pedro Earls, MD;  [facility];  Service: General;  Laterality: N/A;  . none      There were no vitals filed for this visit.  Subjective Assessment - 11/04/19 0931    Subjective  "I am doing about the same since the  last session, pain is 4/10, I haven't been doing my exercise"    Patient Stated Goals  Get rid of right sided neck pain.    Currently in Pain?  Yes    Pain Score  4     Pain Orientation  Right    Pain Descriptors / Indicators  Aching    Pain Type  Chronic pain    Pain Onset  More than a month ago    Pain Frequency  Constant    Aggravating Factors   any activity    Pain Relieving Factors  nothing                       [facility] Adult PT Treatment/Exercise - 11/04/19 0001      Neck Exercises: Machines for Strengthening   UBE (Upper Arm Bike)  L1 x 4 min   changing direction at 2 min     Neck Exercises: Prone   Neck Retraction  10 reps;5 secs    Other Prone Exercise  --      Modalities   Modalities  Moist Heat      Moist Heat Therapy   Number Minutes Moist Heat  10 Minutes    Moist Heat Location  Cervical   in supine  Manual Therapy   Manual Therapy  Soft tissue mobilization;Joint mobilization    Manual therapy comments  skilled palpation and monitoring pt throughout    Soft tissue mobilization  C3-C7 PA grade III      Neck Exercises: Stretches   Upper Trapezius Stretch  Right;2 reps;30 seconds   demonstration for form      Trigger Point Dry Needling - 11/04/19 0001    Consent Given?  Yes    Education Handout Provided  Yes    Muscles Treated Head and Neck  Suboccipitals;Upper trapezius    Upper Trapezius Response  Twitch reponse elicited;Palpable increased muscle length   R only   Suboccipitals Response  Twitch response elicited;Palpable increased muscle length   R          PT Education - 11/04/19 0955    Education Details  muscle anatomy and referral patterns. What TPDN is, benefits and what to expect. reviewed HEp and importance of exercise and stretching to promote relief"    Person(s) Educated  Patient    Methods  Explanation;Verbal cues;Handout    Comprehension  Verbalized understanding;Verbal cues required       PT Short Term Goals -  10/15/19 1443      PT SHORT TERM GOAL #1   Title  Patient will be independent with HEP to progress in PT    Time  4    Period  Weeks    Status  New    Target Date  11/12/19      PT SHORT TERM GOAL #2   Title  Patient will report resting pain level of < or = 6/10 pain level.    Baseline  8/10    Time  4    Period  Weeks    Status  New    Target Date  11/12/19        PT Long Term Goals - 10/16/19 0814      PT LONG TERM GOAL #1   Title  Patient will report improved functional level of < or = 32% FOTO to reduce limitation in daily activities.    Baseline  37% limitation    Time  6    Period  Weeks    Status  New    Target Date  11/26/19      PT LONG TERM GOAL #2   Title  Patient will exhibit improve side bending to 45 deg to indicate reduce muscle tightness and reduce pain.    Baseline  35 deg    Time  6    Period  Weeks    Status  New    Target Date  11/26/19      PT LONG TERM GOAL #3   Title  Patient will report resting neck pain of < or = 3/10 to allow for improved functional level and less migraines.    Time  6    Period  Weeks    Status  New    Target Date  11/26/19      PT LONG TERM GOAL #4   Title  Patient will exhibit improve periscapular strength of grossly 4+/5 MMT to allow for improved postural control.    Time  6    Period  Weeks    Status  New    Target Date  11/26/19            Plan - 11/04/19 1010    Clinical Impression Statement  pt returns for his follow up visit noting he  has not been consistent with his HEP and reports continued pain inthe neck/ shoulder. educated and consent was provided for TPDN focusing on the R upper trap/ sub-occipitals followed with IASTM and cervicothoracic mobs. reviewed HEP and discussed importance of stretching to relieve tension. utilized MHP end of session to calm down soreness.    PT Next Visit Plan  response to TPDN, upper trap, levator, cervical paraspinals; assess HEP, progress postural exercises    PT Home  Exercise Plan  Seated chin tucks, thoracic extension with pec stretch over back of chair, banded row, extension, ER, horiz abduction with red band    Consulted and Agree with Plan of Care  Patient       Patient will benefit from skilled therapeutic intervention in order to improve the following deficits and impairments:  Postural dysfunction, Decreased strength, Impaired flexibility, Decreased activity tolerance, Pain, Decreased range of motion  Visit Diagnosis: Cervicalgia  Abnormal posture     Problem List Patient Active Problem List   Diagnosis Date Noted  . Coronary artery disease involving native coronary artery of native heart without angina pectoris 10/15/2019  . Aortic atherosclerosis ([place]) 10/15/2019  . Tobacco dependence 10/15/2019  . Exertional dyspnea 10/15/2019  . Atypical chest pain 10/15/2019  . Claudication ([place]) 10/15/2019  . OSA and COPD overlap syndrome ([place]) 10/06/2019  . Intractable chronic migraine without aura and with status migrainosus 06/06/2017  . Plantar fasciitis 09/26/2016  . Altered mental status 08/25/2015  . Tremor of right hand 08/25/2015  . Vision changes 08/25/2015  . Worsening headaches 08/25/2015  . New onset of headaches after age 65 08/25/2015  . Sinusitis, chronic 08/25/2015  . Hearing loss 08/25/2015  . Vertigo 08/25/2015  . Lap Nissen June 2013 05/24/2012  . GERD (gastroesophageal reflux disease) 03/28/2012  . Chronic headaches 12/22/2011  . Essential hypertension 12/22/2011  . High cholesterol 12/22/2011  . Depression 12/22/2011    Starr Lake PT, DPT, LAT, ATC  11/04/19  10:14 AM      [place] [facility] [address] [place], [place], [address] Phone: [phone]   Fax:  [phone]  Name: Mitchell Baxter MRN: [id number] Date of Birth: 06-19-1951

## 2019-11-05 NOTE — Telephone Encounter (Addendum)
We received a notice from [place] stating pt currently eligible for assistance because he has eligibility for coverage. I called pt's pharmacy to get pharmacy benefit information (see below) and then I completed a PA for [employer] on CMM. Key: [id number]. Per plan: approved 10/31/19-11/05/19.  I also spoke with pt's wife Thayer Headings (on [place]) because we had received a request for a Qudexy XR 200 mg PA. Pt is no longer on Qudexy XR per wife's report. She doesn't recall why it was stopped. PA will not be done at this time. Pt's wife stated she was told by [employer] she had to look into medicare assistance first. Pt's wife is waiting to hear back from the social security office. She will then return to [place] pt not eligible.  [employer] Medicare ID: Lance Morin: [id number] PCN: [id number] GROUP: [id number]

## 2019-11-06 ENCOUNTER — Telehealth: Payer: Self-pay | Admitting: Neurology

## 2019-11-06 NOTE — Telephone Encounter (Signed)
Received a fax that [employer] was approved for the patient through [employer]. 10/31/2019-11/04/2020.

## 2019-11-11 ENCOUNTER — Telehealth (INDEPENDENT_AMBULATORY_CARE_PROVIDER_SITE_OTHER): Payer: Medicare HMO | Admitting: Cardiology

## 2019-11-11 ENCOUNTER — Ambulatory Visit: Payer: Medicare HMO | Admitting: Physical Therapy

## 2019-11-11 ENCOUNTER — Other Ambulatory Visit: Payer: Self-pay

## 2019-11-11 ENCOUNTER — Other Ambulatory Visit (HOSPITAL_COMMUNITY)
Admission: RE | Admit: 2019-11-11 | Discharge: 2019-11-11 | Disposition: A | Discharge status: Home / Self Care | Payer: Medicare HMO | Source: Ambulatory Visit | Attending: Cardiology | Admitting: Cardiology

## 2019-11-11 ENCOUNTER — Encounter: Payer: Self-pay | Admitting: Cardiology

## 2019-11-11 VITALS — Ht 72.0 in | Wt 240.0 lb

## 2019-11-11 DIAGNOSIS — I25118 Atherosclerotic heart disease of native coronary artery with other forms of angina pectoris: Secondary | ICD-10-CM

## 2019-11-11 DIAGNOSIS — Z01812 Encounter for preprocedural laboratory examination: Secondary | ICD-10-CM | POA: Diagnosis present

## 2019-11-11 DIAGNOSIS — R0600 Dyspnea, unspecified: Secondary | ICD-10-CM

## 2019-11-11 DIAGNOSIS — F17200 Nicotine dependence, unspecified, uncomplicated: Secondary | ICD-10-CM | POA: Diagnosis not present

## 2019-11-11 DIAGNOSIS — R0609 Other forms of dyspnea: Secondary | ICD-10-CM

## 2019-11-11 DIAGNOSIS — Z20822 Contact with and (suspected) exposure to covid-19: Secondary | ICD-10-CM | POA: Insufficient documentation

## 2019-11-11 DIAGNOSIS — R9439 Abnormal result of other cardiovascular function study: Secondary | ICD-10-CM

## 2019-11-11 NOTE — H&P (View-Only) (Signed)
Patient referred by Holland Commons, FNP for coronary and aortic atherosclerosis  Subjective:   Mitchell Baxter, male    DOB: 1950/11/30, 69 y.o.   MRN: [id number]   Chief Complaint  Patient presents with  . Coronary Artery Disease    results    HPI  69 y.o. African American male with hypertension, tobacco dependence, coronary and aortic atherosclerosis, with exertional dyspnea, atypical chest pain.  Lexiscan nuclear stress test showed mild diaphragmatic attenuation, but no ischemia or scar. Echocardiogram showed normal LV function, mild aortic stenosis. ABI was normal.  Patient continues to have constant aching in the chest, and exertional dyspnea with minimal activity.  I discussed findings of stress test as well as CTA with the patient.     Current Outpatient Medications on File Prior to Visit  Medication Sig Dispense Refill  . aspirin EC 81 MG tablet Take 1 tablet (81 mg total) by mouth daily. 30 tablet 3  . atenolol (TENORMIN) 25 MG tablet Take 1 tablet (25 mg total) by mouth daily. 30 tablet 11  . atorvastatin (LIPITOR) 40 MG tablet Take 40 mg by mouth daily.    Eduard Roux (AIMOVIG) 140 MG/ML SOAJ Inject 140 mg into the skin every 30 (thirty) days. 1 pen 11  . FLUoxetine (PROZAC) 40 MG capsule Take 40 mg by mouth every evening.     . gabapentin (NEURONTIN) 300 MG capsule Take 300 mg by mouth every morning.  6  . MELATONIN PO Take 10 mg by mouth at bedtime.    . rizatriptan (MAXALT-MLT) 10 MG disintegrating tablet Take 1 tablet (10 mg total) by mouth as needed for migraine. May repeat in 2 hours if needed. Max 2x a day 9 tablet 11  . topiramate ER (QUDEXY XR) 200 MG CS24 sprinkle capsule Take 1 capsule (200 mg total) by mouth at bedtime. 30 capsule 5  . valsartan-hydrochlorothiazide (DIOVAN-HCT) 160-12.5 MG tablet TAKE 1/2 TABLET BY MOUTH ONCE A DAY     No current facility-administered medications on file prior to visit.    Cardiovascular and other pertinent  studies:  Lexiscan Tetrofosmin Stress Test  11/03/2019: Nondiagnostic ECG stress. Mild diaphragmatic attenuation. No ischemia or scar.  Stress LV EF: 63%.  No previous exam available for comparison. Low risk study.   Echocardiogram 10/29/2019: Left ventricle cavity is normal in size. Mild concentric hypertrophy of the left ventricle. Normal global wall motion. Normal LV systolic function with EF 55%. Normal diastolic filling pattern. Aortic valve is probably trileaflet, with mild calcification. Mild aortic stenosis. Aortic valve mean gradient of 10 mmHg, Vmax of 2.1  m/s. Calculated aortic valve area by continuity equation is 1.9 cm. Inadequate TR jet to estimate pulmonary artery systolic pressure. Normal right atrial pressure.  Compared to previous study in 2017, mild aortic stenosis is new.   ABI 10/28/2019: This exam reveals normal perfusion of the right lower extremity (ABI 1.02).  This exam reveals normal perfusion of the left lower extremity (ABI1.02).  Normal triphasic waveform at the ankle.   EKG 08/20/2019: Sinus rhythm 61 bpm.  Normal EKG.  Recent labs: 08/13/2019: Glucose 99, BUN/Cr 13/1.2. EGFR 85. Na/K 138/4.2. Rest of the CMP normal H/H 16/47. MCV 95. Platelets 202 Chol 159, TG 91, HDL 45, LDL 96 TSH normal  CT Chest 09/12/2019: 1. Lung-RADS 2, benign appearance or behavior. Continue annual screening with low-dose chest CT without contrast in 12 months. 2. Aortic atherosclerosis (ICD10-I70.0), coronary artery atherosclerosis and emphysema (ICD10-J43.9).  Review of Systems  Cardiovascular: Positive for chest pain, claudication and dyspnea on exertion.  Respiratory: Positive for shortness of breath.   All other systems reviewed and are negative.       No vitals available.   Objective:   Physical Exam   Not performed. Telephone visit.      Assessment & Recommendations:   69 y.o. African American male with hypertension, tobacco dependence,  coronary and aortic atherosclerosis, with exertional dyspnea, atypical chest pain.  Atypical chest pain: Stress test only showed mild diaphragmatic attenuation. However, I personally reviewed his CT Chest. He does have large amount of calcium in left main coronary artery, as well in LAD/Diag. It is plausible that he could have balanced ischemia limiting accurary of stress test. CTA may have limitations in quantifying CAD due to amount of calcium. Given his ongoing chest pain symptoms, only definitive way to evaluate CAD as etiology of his symptoms, would be performing invasive coronary angiogram.   Schedule for cardiac catheterization, and possible angioplasty. We discussed regarding risks, benefits, alternatives to this including stress testing, CTA and continued medical therapy. Patient wants to proceed. Understands <1-2% risk of death, stroke, MI, urgent CABG, bleeding, infection, renal failure but not limited to these.  Scheduled for 1/15.   Exertional dyspnea: COPD likely etiology, although obstructive CAD cannot be excluded. Defer PFT workup to PCP.  Coronary and aortic atherosclerosis: Plan as above, Continue aspirin 81 mg, Lipitor 40 mg daily.  Claudication: Normal triphasic waveform and ABI. Likely pseudoclaudication.  Episodes of shaking: I am concerned that he may have seizure episodes.  Strongly recommend considering this in the differential.  Consider neurology referral.  Tobacco dependence: Re-emphasized smoking cessation.    Thank you for referring the patient to [place]. Please feel free to contact with any questions.  Nigel Mormon, MD [facility] [place] Pager: [phone] Office: [phone]

## 2019-11-11 NOTE — Progress Notes (Signed)
Patient referred by Holland Commons, FNP for coronary and aortic atherosclerosis  Subjective:   Mitchell Baxter, male    DOB: June 01, 1951, 69 y.o.   MRN: [id number]   Chief Complaint  Patient presents with  . Coronary Artery Disease    results    HPI  69 y.o. African American male with hypertension, tobacco dependence, coronary and aortic atherosclerosis, with exertional dyspnea, atypical chest pain.  Lexiscan nuclear stress test showed mild diaphragmatic attenuation, but no ischemia or scar. Echocardiogram showed normal LV function, mild aortic stenosis. ABI was normal.  Patient continues to have constant aching in the chest, and exertional dyspnea with minimal activity.  I discussed findings of stress test as well as CTA with the patient.     Current Outpatient Medications on File Prior to Visit  Medication Sig Dispense Refill  . aspirin EC 81 MG tablet Take 1 tablet (81 mg total) by mouth daily. 30 tablet 3  . atenolol (TENORMIN) 25 MG tablet Take 1 tablet (25 mg total) by mouth daily. 30 tablet 11  . atorvastatin (LIPITOR) 40 MG tablet Take 40 mg by mouth daily.    Eduard Roux (AIMOVIG) 140 MG/ML SOAJ Inject 140 mg into the skin every 30 (thirty) days. 1 pen 11  . FLUoxetine (PROZAC) 40 MG capsule Take 40 mg by mouth every evening.     . gabapentin (NEURONTIN) 300 MG capsule Take 300 mg by mouth every morning.  6  . MELATONIN PO Take 10 mg by mouth at bedtime.    . rizatriptan (MAXALT-MLT) 10 MG disintegrating tablet Take 1 tablet (10 mg total) by mouth as needed for migraine. May repeat in 2 hours if needed. Max 2x a day 9 tablet 11  . topiramate ER (QUDEXY XR) 200 MG CS24 sprinkle capsule Take 1 capsule (200 mg total) by mouth at bedtime. 30 capsule 5  . valsartan-hydrochlorothiazide (DIOVAN-HCT) 160-12.5 MG tablet TAKE 1/2 TABLET BY MOUTH ONCE A DAY     No current facility-administered medications on file prior to visit.    Cardiovascular and other pertinent  studies:  Lexiscan Tetrofosmin Stress Test  11/03/2019: Nondiagnostic ECG stress. Mild diaphragmatic attenuation. No ischemia or scar.  Stress LV EF: 63%.  No previous exam available for comparison. Low risk study.   Echocardiogram 10/29/2019: Left ventricle cavity is normal in size. Mild concentric hypertrophy of the left ventricle. Normal global wall motion. Normal LV systolic function with EF 55%. Normal diastolic filling pattern. Aortic valve is probably trileaflet, with mild calcification. Mild aortic stenosis. Aortic valve mean gradient of 10 mmHg, Vmax of 2.1  m/s. Calculated aortic valve area by continuity equation is 1.9 cm. Inadequate TR jet to estimate pulmonary artery systolic pressure. Normal right atrial pressure.  Compared to previous study in 2017, mild aortic stenosis is new.   ABI 10/28/2019: This exam reveals normal perfusion of the right lower extremity (ABI 1.02).  This exam reveals normal perfusion of the left lower extremity (ABI1.02).  Normal triphasic waveform at the ankle.   EKG 08/20/2019: Sinus rhythm 61 bpm.  Normal EKG.  Recent labs: 08/13/2019: Glucose 99, BUN/Cr 13/1.2. EGFR 85. Na/K 138/4.2. Rest of the CMP normal H/H 16/47. MCV 95. Platelets 202 Chol 159, TG 91, HDL 45, LDL 96 TSH normal  CT Chest 09/12/2019: 1. Lung-RADS 2, benign appearance or behavior. Continue annual screening with low-dose chest CT without contrast in 12 months. 2. Aortic atherosclerosis (ICD10-I70.0), coronary artery atherosclerosis and emphysema (ICD10-J43.9).  Review of Systems  Cardiovascular: Positive for chest pain, claudication and dyspnea on exertion.  Respiratory: Positive for shortness of breath.   All other systems reviewed and are negative.       No vitals available.   Objective:   Physical Exam   Not performed. Telephone visit.      Assessment & Recommendations:   69 y.o. African American male with hypertension, tobacco dependence,  coronary and aortic atherosclerosis, with exertional dyspnea, atypical chest pain.  Atypical chest pain: Stress test only showed mild diaphragmatic attenuation. However, I personally reviewed his CT Chest. He does have large amount of calcium in left main coronary artery, as well in LAD/Diag. It is plausible that he could have balanced ischemia limiting accurary of stress test. CTA may have limitations in quantifying CAD due to amount of calcium. Given his ongoing chest pain symptoms, only definitive way to evaluate CAD as etiology of his symptoms, would be performing invasive coronary angiogram.   Schedule for cardiac catheterization, and possible angioplasty. We discussed regarding risks, benefits, alternatives to this including stress testing, CTA and continued medical therapy. Patient wants to proceed. Understands <1-2% risk of death, stroke, MI, urgent CABG, bleeding, infection, renal failure but not limited to these.  Scheduled for 1/15.   Exertional dyspnea: COPD likely etiology, although obstructive CAD cannot be excluded. Defer PFT workup to PCP.  Coronary and aortic atherosclerosis: Plan as above, Continue aspirin 81 mg, Lipitor 40 mg daily.  Claudication: Normal triphasic waveform and ABI. Likely pseudoclaudication.  Episodes of shaking: I am concerned that he may have seizure episodes.  Strongly recommend considering this in the differential.  Consider neurology referral.  Tobacco dependence: Re-emphasized smoking cessation.    Thank you for referring the patient to [place]. Please feel free to contact with any questions.  Nigel Mormon, MD [facility] Pager: [phone] Office: [phone]

## 2019-11-12 ENCOUNTER — Telehealth: Payer: Medicare Other | Admitting: Cardiology

## 2019-11-12 LAB — CBC
Hematocrit: 44.9 % (ref 37.5–51.0)
Hemoglobin: 15.6 g/dL (ref 13.0–17.7)
MCH: 33.2 pg — ABNORMAL HIGH (ref 26.6–33.0)
MCHC: 34.7 g/dL (ref 31.5–35.7)
MCV: 96 fL (ref 79–97)
Platelets: 251 10*3/uL (ref 150–450)
RBC: 4.7 x10E6/uL (ref 4.14–5.80)
RDW: 12.3 % (ref 11.6–15.4)
WBC: 8.3 10*3/uL (ref 3.4–10.8)

## 2019-11-13 ENCOUNTER — Telehealth: Payer: Self-pay

## 2019-11-13 LAB — NOVEL CORONAVIRUS, NAA (HOSP ORDER, SEND-OUT TO REF LAB; TAT 18-24 HRS): SARS-CoV-2, NAA: NOT DETECTED

## 2019-11-13 NOTE — Telephone Encounter (Signed)
Okay to continue meds. Don't eat anything after midnight.

## 2019-11-13 NOTE — Telephone Encounter (Signed)
Pt aware.

## 2019-11-13 NOTE — Telephone Encounter (Signed)
Telephone encounter:  Reason for call: Pt wife called and wants to know what medicines does the pt need to stop before tomorrow procedure; or anything that he needs to do   Usual provider: MP  Last office visit: 11/11/2019  Next office visit:    Last hospitalization:    Current Outpatient Medications on File Prior to Visit  Medication Sig Dispense Refill  . acetaminophen (TYLENOL) 500 MG tablet Take 2,000 mg by mouth 2 (two) times daily as needed for headache.    Marland Kitchen aspirin EC 81 MG tablet Take 1 tablet (81 mg total) by mouth daily. (Patient taking differently: Take 81 mg by mouth at bedtime. ) 30 tablet 3  . atenolol (TENORMIN) 25 MG tablet Take 1 tablet (25 mg total) by mouth daily. (Patient not taking: Reported on 11/12/2019) 30 tablet 11  . atorvastatin (LIPITOR) 40 MG tablet Take 40 mg by mouth at bedtime.     Eduard Roux (AIMOVIG) 140 MG/ML SOAJ Inject 140 mg into the skin every 30 (thirty) days. 1 pen 11  . FLUoxetine (PROZAC) 40 MG capsule Take 40 mg by mouth at bedtime.     . gabapentin (NEURONTIN) 300 MG capsule Take 300 mg by mouth at bedtime.   6  . rizatriptan (MAXALT-MLT) 10 MG disintegrating tablet Take 1 tablet (10 mg total) by mouth as needed for migraine. May repeat in 2 hours if needed. Max 2x a day 9 tablet 11  . topiramate ER (QUDEXY XR) 200 MG CS24 sprinkle capsule Take 1 capsule (200 mg total) by mouth at bedtime. (Patient not taking: Reported on 11/12/2019) 30 capsule 5  . valsartan-hydrochlorothiazide (DIOVAN-HCT) 160-12.5 MG tablet Take 0.5 tablets by mouth at bedtime.      No current facility-administered medications on file prior to visit.

## 2019-11-14 ENCOUNTER — Ambulatory Visit (HOSPITAL_COMMUNITY)
Admission: RE | Admit: 2019-11-14 | Discharge: 2019-11-14 | Disposition: A | Discharge status: Home / Self Care | Payer: Medicare HMO | Attending: Cardiology | Admitting: Cardiology

## 2019-11-14 ENCOUNTER — Other Ambulatory Visit: Payer: Self-pay

## 2019-11-14 ENCOUNTER — Encounter (HOSPITAL_COMMUNITY)
Admission: RE | Disposition: A | Discharge status: Home / Self Care | Payer: Self-pay | Source: Home / Self Care | Attending: Cardiology

## 2019-11-14 DIAGNOSIS — R0789 Other chest pain: Secondary | ICD-10-CM | POA: Diagnosis present

## 2019-11-14 DIAGNOSIS — F17200 Nicotine dependence, unspecified, uncomplicated: Secondary | ICD-10-CM | POA: Insufficient documentation

## 2019-11-14 DIAGNOSIS — Z79899 Other long term (current) drug therapy: Secondary | ICD-10-CM | POA: Insufficient documentation

## 2019-11-14 DIAGNOSIS — I700 Atherosclerosis of aorta: Secondary | ICD-10-CM | POA: Insufficient documentation

## 2019-11-14 DIAGNOSIS — I2510 Atherosclerotic heart disease of native coronary artery without angina pectoris: Secondary | ICD-10-CM | POA: Diagnosis not present

## 2019-11-14 DIAGNOSIS — I25118 Atherosclerotic heart disease of native coronary artery with other forms of angina pectoris: Secondary | ICD-10-CM | POA: Diagnosis not present

## 2019-11-14 DIAGNOSIS — Z7982 Long term (current) use of aspirin: Secondary | ICD-10-CM | POA: Insufficient documentation

## 2019-11-14 DIAGNOSIS — J449 Chronic obstructive pulmonary disease, unspecified: Secondary | ICD-10-CM | POA: Diagnosis not present

## 2019-11-14 DIAGNOSIS — I10 Essential (primary) hypertension: Secondary | ICD-10-CM | POA: Diagnosis not present

## 2019-11-14 DIAGNOSIS — I739 Peripheral vascular disease, unspecified: Secondary | ICD-10-CM | POA: Diagnosis not present

## 2019-11-14 DIAGNOSIS — R0609 Other forms of dyspnea: Secondary | ICD-10-CM | POA: Diagnosis present

## 2019-11-14 HISTORY — PX: LEFT HEART CATH AND CORONARY ANGIOGRAPHY: CATH118249

## 2019-11-14 HISTORY — PX: INTRAVASCULAR PRESSURE WIRE/FFR STUDY: CATH118243

## 2019-11-14 LAB — BASIC METABOLIC PANEL
Anion gap: 11 (ref 5–15)
BUN: 15 mg/dL (ref 8–23)
CO2: 24 mmol/L (ref 22–32)
Calcium: 8.9 mg/dL (ref 8.9–10.3)
Chloride: 106 mmol/L (ref 98–111)
Creatinine, Ser: 1.3 mg/dL — ABNORMAL HIGH (ref 0.61–1.24)
GFR calc Af Amer: >60 mL/min (ref >60)
GFR calc non Af Amer: 56 mL/min — ABNORMAL LOW (ref >60)
Glucose, Bld: 117 mg/dL — ABNORMAL HIGH (ref 70–99)
Potassium: 4 mmol/L (ref 3.5–5.1)
Sodium: 141 mmol/L (ref 135–145)

## 2019-11-14 SURGERY — LEFT HEART CATH AND CORONARY ANGIOGRAPHY
Anesthesia: LOCAL

## 2019-11-14 MED ORDER — ASPIRIN 81 MG PO CHEW
81.0000 mg | CHEWABLE_TABLET | ORAL | Status: AC
  Administered 2019-11-14: 81 mg via ORAL
  Filled 2019-11-14: qty 1

## 2019-11-14 MED ORDER — NITROGLYCERIN 1 MG/10 ML FOR IR/CATH LAB
INTRA_ARTERIAL | Status: AC
  Filled 2019-11-14: qty 10

## 2019-11-14 MED ORDER — HEPARIN (PORCINE) IN NACL 1000-0.9 UT/500ML-% IV SOLN
INTRAVENOUS | Status: AC
  Filled 2019-11-14: qty 1000

## 2019-11-14 MED ORDER — SODIUM CHLORIDE 0.9 % IV SOLN: INTRAVENOUS | Status: AC

## 2019-11-14 MED ORDER — SODIUM CHLORIDE 0.9% FLUSH: 3.0000 mL | Freq: Two times a day (BID) | INTRAVENOUS | Status: DC

## 2019-11-14 MED ORDER — LIDOCAINE HCL (PF) 1 % IJ SOLN
INTRAMUSCULAR | Status: DC | PRN
  Administered 2019-11-14: 2 mL

## 2019-11-14 MED ORDER — SODIUM CHLORIDE 0.9 % IV SOLN: 250.0000 mL | INTRAVENOUS | Status: DC | PRN

## 2019-11-14 MED ORDER — SODIUM CHLORIDE 0.9% FLUSH: 3.0000 mL | INTRAVENOUS | Status: DC | PRN

## 2019-11-14 MED ORDER — LABETALOL HCL 5 MG/ML IV SOLN: 10.0000 mg | INTRAVENOUS | Status: DC | PRN

## 2019-11-14 MED ORDER — HEPARIN SODIUM (PORCINE) 1000 UNIT/ML IJ SOLN
INTRAMUSCULAR | Status: DC | PRN
  Administered 2019-11-14 (×2): 5000 [IU] via INTRAVENOUS

## 2019-11-14 MED ORDER — NITROGLYCERIN 1 MG/10 ML FOR IR/CATH LAB
INTRA_ARTERIAL | Status: DC | PRN
  Administered 2019-11-14: 200 ug via INTRACORONARY

## 2019-11-14 MED ORDER — ACETAMINOPHEN 325 MG PO TABS: 650.0000 mg | ORAL_TABLET | ORAL | Status: DC | PRN

## 2019-11-14 MED ORDER — HEPARIN SODIUM (PORCINE) 1000 UNIT/ML IJ SOLN
INTRAMUSCULAR | Status: AC
  Filled 2019-11-14: qty 1

## 2019-11-14 MED ORDER — VERAPAMIL HCL 2.5 MG/ML IV SOLN
INTRAVENOUS | Status: DC | PRN
  Administered 2019-11-14: 10 mL via INTRA_ARTERIAL

## 2019-11-14 MED ORDER — HEPARIN (PORCINE) IN NACL 1000-0.9 UT/500ML-% IV SOLN
INTRAVENOUS | Status: DC | PRN
  Administered 2019-11-14: 500 mL

## 2019-11-14 MED ORDER — HYDRALAZINE HCL 20 MG/ML IJ SOLN: 10.0000 mg | INTRAMUSCULAR | Status: DC | PRN

## 2019-11-14 MED ORDER — FENTANYL CITRATE (PF) 100 MCG/2ML IJ SOLN
INTRAMUSCULAR | Status: AC
  Filled 2019-11-14: qty 2

## 2019-11-14 MED ORDER — IOHEXOL 350 MG/ML SOLN
INTRAVENOUS | Status: DC | PRN
  Administered 2019-11-14: 55 mL

## 2019-11-14 MED ORDER — FENTANYL CITRATE (PF) 100 MCG/2ML IJ SOLN
INTRAMUSCULAR | Status: DC | PRN
  Administered 2019-11-14: 25 ug via INTRAVENOUS

## 2019-11-14 MED ORDER — SODIUM CHLORIDE 0.9 % WEIGHT BASED INFUSION: 1.0000 mL/kg/h | INTRAVENOUS | Status: DC

## 2019-11-14 MED ORDER — ONDANSETRON HCL 4 MG/2ML IJ SOLN: 4.0000 mg | Freq: Four times a day (QID) | INTRAMUSCULAR | Status: DC | PRN

## 2019-11-14 MED ORDER — SODIUM CHLORIDE 0.9 % WEIGHT BASED INFUSION
3.0000 mL/kg/h | INTRAVENOUS | Status: AC
  Administered 2019-11-14: 06:00:00 3 mL/kg/h via INTRAVENOUS

## 2019-11-14 MED ORDER — LIDOCAINE HCL (PF) 1 % IJ SOLN
INTRAMUSCULAR | Status: AC
  Filled 2019-11-14: qty 30

## 2019-11-14 MED ORDER — MIDAZOLAM HCL 2 MG/2ML IJ SOLN
INTRAMUSCULAR | Status: AC
  Filled 2019-11-14: qty 2

## 2019-11-14 MED ORDER — VERAPAMIL HCL 2.5 MG/ML IV SOLN
INTRAVENOUS | Status: AC
  Filled 2019-11-14: qty 2

## 2019-11-14 MED ORDER — MIDAZOLAM HCL 2 MG/2ML IJ SOLN
INTRAMUSCULAR | Status: DC | PRN
  Administered 2019-11-14: 1 mg via INTRAVENOUS

## 2019-11-14 SURGICAL SUPPLY — 13 items
CATH 5FR JL3.5 JR4 ANG PIG MP (CATHETERS) ×1 IMPLANT
CATH LAUNCHER 6FR EBU3.5 (CATHETERS) ×1 IMPLANT
DEVICE RAD COMP TR BAND LRG (VASCULAR PRODUCTS) ×1 IMPLANT
GLIDESHEATH SLEND A-KIT 6F 22G (SHEATH) ×1 IMPLANT
GUIDEWIRE INQWIRE 1.5J.035X260 (WIRE) IMPLANT
GUIDEWIRE PRESSURE COMET II (WIRE) ×1 IMPLANT
INQWIRE 1.5J .035X260CM (WIRE) ×2
KIT HEART LEFT (KITS) ×2 IMPLANT
KIT HEMO VALVE WATCHDOG (MISCELLANEOUS) ×1 IMPLANT
PACK CARDIAC CATHETERIZATION (CUSTOM PROCEDURE TRAY) ×2 IMPLANT
SYR MEDRAD MARK 7 150ML (SYRINGE) ×2 IMPLANT
TRANSDUCER W/STOPCOCK (MISCELLANEOUS) ×2 IMPLANT
TUBING CIL FLEX 10 FLL-RA (TUBING) ×2 IMPLANT

## 2019-11-14 NOTE — Interval H&P Note (Signed)
History and Physical Interval Note:  11/14/2019 7:36 AM  Mitchell Baxter  has presented today for surgery, with the diagnosis of cad.  The various methods of treatment have been discussed with the patient and family. After consideration of risks, benefits and other options for treatment, the patient has consented to  Procedure(s): LEFT HEART CATH AND CORONARY ANGIOGRAPHY (N/A) as a surgical intervention.  The patient's history has been reviewed, patient examined, no change in status, stable for surgery.  I have reviewed the patient's chart and labs.  Questions were answered to the patient's satisfaction.    2016/2017 Appropriate Use Criteria for Coronary Revascularization Clinical Presentation: Diabetes Mellitus? Symptom Status? S/P CABG? Antianginal Therapy (# of long-acting drugs)? Results of Non-invasive testing? FFR/iFR results in all diseased vessels? Patient undergoing renal transplant? Patient undergoing percutaneous valve procedure (TAVR, MitraClip, Others)? Symptom Status:  Ischemic Symptoms  Non-invasive Testing:  High risk  If no or indeterminate stress test, FFR/iFR results in all diseased vessels:  N/A  Diabetes Mellitus:  No  S/P CABG:  No  Antianginal therapy (number of long-acting drugs):  >=2  Patient undergoing renal transplant:  No  Patient undergoing percutaneous valve procedure:  No    newline 1 Vessel Disease PCI CABG  No proximal LAD involvement, No proximal left dominant LCX involvement A (8); Indication 2 M (6); Indication 2   Proximal left dominant LCX involvement A (8); Indication 5 A (8); Indication 5   Proximal LAD involvement A (8); Indication 5 A (8); Indication 5   newline 2 Vessel Disease  No proximal LAD involvement A (8); Indication 8 A (7); Indication 8   Proximal LAD involvement A (8); Indication 11 A (8); Indication 11   newline 3 Vessel Disease  Low disease complexity (e.g., focal stenoses, SYNTAX <=22) A (8); Indication 17 A (8); Indication 17    Intermediate or high disease complexity (e.g., SYNTAX >=23) M (6); Indication 21 A (9); Indication 21   newline Left Main Disease  Isolated LMCA disease: ostial or midshaft A (7); Indication 24 A (9); Indication 24   Isolated LMCA disease: bifurcation involvement M (6); Indication 25 A (9); Indication 25   LMCA ostial or midshaft, concurrent low disease burden multivessel disease (e.g., 1-2 additional focal stenoses, SYNTAX <=22) A (7); Indication 26 A (9); Indication 26   LMCA ostial or midshaft, concurrent intermediate or high disease burden multivessel disease (e.g., 1-2 additional bifurcation stenoses, long stenoses, SYNTAX >=23) M (4); Indication 27 A (9); Indication 27   LMCA bifurcation involvement, concurrent low disease burden multivessel disease (e.g., 1-2 additional focal stenoses, SYNTAX <=22) M (6); Indication 28 A (9); Indication 28   LMCA bifurcation involvement, concurrent intermediate or high disease burden multivessel disease (e.g., 1-2 additional bifurcation stenoses, long stenoses, SYNTAX >=23) R (3); Indication 29 A (9); Indication Iatan

## 2019-11-14 NOTE — Discharge Instructions (Signed)
Radial Site Care  This sheet gives you information about how to care for yourself after your procedure. Your health care provider may also give you more specific instructions. If you have problems or questions, contact your health care provider. What can I expect after the procedure? After the procedure, it is common to have:  Bruising and tenderness at the catheter insertion area. Follow these instructions at home: Medicines  Take over-the-counter and prescription medicines only as told by your health care provider. Insertion site care  Follow instructions from your health care provider about how to take care of your insertion site. Make sure you: ? Wash your hands with soap and water before you change your bandage (dressing). If soap and water are not available, use hand sanitizer. ? Change your dressing as told by your health care provider.  Check your insertion site every day for signs of infection. Check for: ? Redness, swelling, or pain. ? Fluid or blood. ? Pus or a bad smell. ? Warmth.  Do not take baths, swim, or use a hot tub until your health care provider approves.  You may shower 24-48 hours after the procedure. ? Remove the dressing and gently wash the site with plain soap and water. ? Pat the area dry with a clean towel. ? Do not rub the site. That could cause bleeding.  Do not apply powder or lotion to the site. Activity   For 24 hours after the procedure, or as directed by your health care provider: ? Do not flex or bend the affected arm. ? Do not push or pull heavy objects with the affected arm. ? Do not drive yourself home from the hospital or clinic. You may drive 24 hours after the procedure . ? Do not operate machinery or power tools.  Do not lift anything that is heavier than 10 lb for 5 days.  Ask your health care provider when it is okay to: ? Return to work or school. ? Resume usual physical activities or sports. ? Resume sexual activity. General  instructions  If the catheter site starts to bleed, raise your arm and put firm pressure on the site. If the bleeding does not stop, get help right away. This is a medical emergency.  If you went home on the same day as your procedure, a responsible adult should be with you for the first 24 hours after you arrive home.  Keep all follow-up visits as told by your health care provider. This is important. Contact a health care provider if:  You have a fever.  You have redness, swelling, or yellow drainage around your insertion site. Get help right away if:  You have unusual pain at the radial site.  The catheter insertion area swells very fast.  The insertion area is bleeding, and the bleeding does not stop when you hold steady pressure on the area.  Your arm or hand becomes pale, cool, tingly, or numb. These symptoms may represent a serious problem that is an emergency. Do not wait to see if the symptoms will go away. Get medical help right away. Call your local emergency services (911 in the [place] Do not drive yourself to the hospital. Summary  After the procedure, it is common to have bruising and tenderness at the site.  Follow instructions from your health care provider about how to take care of your radial site wound. Check the wound every day for signs of infection.  Do not lift anything that is heavier than 10  lb for 5 days.  This information is not intended to replace advice given to you by your health care provider. Make sure you discuss any questions you have with your health care provider. Document Revised: 11/21/2017 Document Reviewed: 11/21/2017 Elsevier Patient Education  2020 [employer].

## 2019-11-15 ENCOUNTER — Inpatient Hospital Stay (HOSPITAL_COMMUNITY): Admission: RE | Admit: 2019-11-15 | Payer: Self-pay | Source: Ambulatory Visit

## 2019-11-17 ENCOUNTER — Other Ambulatory Visit (HOSPITAL_COMMUNITY)
Admission: RE | Admit: 2019-11-17 | Discharge: 2019-11-17 | Disposition: A | Discharge status: Home / Self Care | Payer: Medicare HMO | Source: Ambulatory Visit | Attending: Neurology | Admitting: Neurology

## 2019-11-17 DIAGNOSIS — Z01812 Encounter for preprocedural laboratory examination: Secondary | ICD-10-CM | POA: Insufficient documentation

## 2019-11-17 DIAGNOSIS — Z20822 Contact with and (suspected) exposure to covid-19: Secondary | ICD-10-CM | POA: Insufficient documentation

## 2019-11-17 LAB — SARS CORONAVIRUS 2 (TAT 6-24 HRS): SARS Coronavirus 2: NEGATIVE

## 2019-11-18 ENCOUNTER — Encounter: Payer: Self-pay | Admitting: Physical Therapy

## 2019-11-18 ENCOUNTER — Other Ambulatory Visit: Payer: Self-pay

## 2019-11-18 ENCOUNTER — Ambulatory Visit: Payer: Medicare HMO | Admitting: Physical Therapy

## 2019-11-18 ENCOUNTER — Ambulatory Visit (INDEPENDENT_AMBULATORY_CARE_PROVIDER_SITE_OTHER): Payer: Medicare HMO | Admitting: Neurology

## 2019-11-18 DIAGNOSIS — G43711 Chronic migraine without aura, intractable, with status migrainosus: Secondary | ICD-10-CM

## 2019-11-18 DIAGNOSIS — G4733 Obstructive sleep apnea (adult) (pediatric): Secondary | ICD-10-CM | POA: Diagnosis not present

## 2019-11-18 DIAGNOSIS — M542 Cervicalgia: Secondary | ICD-10-CM

## 2019-11-18 DIAGNOSIS — R519 Headache, unspecified: Secondary | ICD-10-CM

## 2019-11-18 DIAGNOSIS — R413 Other amnesia: Secondary | ICD-10-CM

## 2019-11-18 DIAGNOSIS — M7918 Myalgia, other site: Secondary | ICD-10-CM

## 2019-11-18 DIAGNOSIS — R293 Abnormal posture: Secondary | ICD-10-CM

## 2019-11-18 DIAGNOSIS — F17200 Nicotine dependence, unspecified, uncomplicated: Secondary | ICD-10-CM

## 2019-11-18 NOTE — Therapy (Signed)
[place], [place], [address] Phone: [phone]   Fax:  [phone]  Physical Therapy Treatment /  Discharge   Patient Details  Name: Mitchell Baxter MRN: [id number] Date of Birth: 08-17-1951 Referring Provider (PT): Melvenia Beam, MD   Encounter Date: 11/18/2019  PT End of Session - 11/18/19 1022    Visit Number  3    Number of Visits  6    Date for PT Re-Evaluation  11/26/19    Authorization Type  MCR A&B    PT Start Time  1022   pt arrived 7 min late   PT Stop Time  1055    PT Time Calculation (min)  33 min    Activity Tolerance  Patient tolerated treatment well    Behavior During Therapy  [facility] for tasks assessed/performed       Past Medical History:  Diagnosis Date  . Chronic migraine w/o aura, not intractable, w stat migr   . COPD (chronic obstructive pulmonary disease) ([place])   . Depression   . GERD (gastroesophageal reflux disease)   . Headache(784.0)    migraine  . Hyperlipidemia   . Hypertension   . Shortness of breath    exertion  . Sleep apnea    stopbang=4  . Vertigo     Past Surgical History:  Procedure Laterality Date  . BRAVO [place] STUDY  11/29/2011   Procedure: BRAVO [place];  Surgeon: Landry Dyke, MD;  Location: [facility];  Service: Endoscopy;  Laterality: N/A;  . COLONOSCOPY  10/03/2012   Procedure: COLONOSCOPY;  Surgeon: Shann Medal, MD;  Location: [facility];  Service: General;  Laterality: N/A;  carol/leone  . HIATAL HERNIA REPAIR  04/16/2012   Procedure: LAPAROSCOPIC REPAIR OF HIATAL HERNIA;  Surgeon: Pedro Earls, MD;  Location: [facility];  Service: General;;  . INTRAVASCULAR PRESSURE WIRE/FFR STUDY N/A 11/14/2019   Procedure: INTRAVASCULAR PRESSURE WIRE/FFR STUDY;  Surgeon: Nigel Mormon, MD;  Location: [place] [facility];  Service: Cardiovascular;  Laterality: N/A;  . LAPAROSCOPIC NISSEN FUNDOPLICATION  06/07/9832   Procedure: LAPAROSCOPIC NISSEN  FUNDOPLICATION;  Surgeon: Pedro Earls, MD;  Location: [facility];  Service: General;  Laterality: N/A;  . LEFT HEART CATH AND CORONARY ANGIOGRAPHY N/A 11/14/2019   Procedure: LEFT HEART CATH AND CORONARY ANGIOGRAPHY;  Surgeon: Nigel Mormon, MD;  Location: [facility];  Service: Cardiovascular;  Laterality: N/A;  . none      There were no vitals filed for this visit.  Subjective Assessment - 11/18/19 1024    Subjective  "I did get some relief with the TPDN after the last session but the pain did come back and I have been doing the stretches daily, I am unsure if I really want to do the dry needling today"    Currently in Pain?  Yes    Pain Score  3          [facility] PT Assessment - 11/18/19 0001      Observation/Other Assessments   Focus on Therapeutic Outcomes (FOTO)   40% limited      AROM   Cervical Flexion  45    Cervical Extension  40    Cervical - Right Side Bend  36    Cervical - Left Side Bend  38    Cervical - Right Rotation  62    Cervical - Left Rotation  60      Strength  Right Shoulder Flexion  5/5    Right Shoulder Extension  4+/5    Right Shoulder ABduction  4+/5    Right Shoulder Internal Rotation  5/5    Right Shoulder External Rotation  4+/5    Right Shoulder Horizontal ABduction  4/5    Left Shoulder Flexion  5/5    Left Shoulder Extension  4+/5    Left Shoulder ABduction  4+/5    Left Shoulder Internal Rotation  5/5    Left Shoulder External Rotation  4+/5    Left Shoulder Horizontal ABduction  4/5                   [facility] Adult PT Treatment/Exercise - 11/18/19 0001      Self-Care   Self-Care  Other Self-Care Comments    Other Self-Care Comments   how to perform MTPR along R upper trap using theracane and where it could be purchased       Manual Therapy   Manual therapy comments  MTPR along the R upper trap and tack and stretch techniques             PT Education - 11/18/19 1156    Education Details  Reviewed HEP  and discussed benefits for consistency especially over the course of a few months. "The pain didn't occur in one day or week, the pain/ stiffness won't go away in a day or a week"  benefits of MTPR and how to perform at home using tools such as a theracane.    Person(s) Educated  Patient    Methods  Explanation;Verbal cues;Handout    Comprehension  Verbalized understanding;Verbal cues required       PT Short Term Goals - 11/18/19 1205      PT SHORT TERM GOAL #1   Title  Patient will be independent with HEP to progress in PT    Status  Partially Met      PT SHORT TERM GOAL #2   Title  Patient will report resting pain level of < or = 6/10 pain level.    Status  Achieved        PT Long Term Goals - 11/18/19 1206      PT LONG TERM GOAL #1   Title  Patient will report improved functional level of < or = 32% FOTO to reduce limitation in daily activities.    Period  Weeks    Status  Not Met      PT LONG TERM GOAL #2   Title  Patient will exhibit improve side bending to 45 deg to indicate reduce muscle tightness and reduce pain.    Period  Weeks    Status  Partially Met      PT LONG TERM GOAL #3   Title  Patient will report resting neck pain of < or = 3/10 to allow for improved functional level and less migraines.    Period  Weeks    Status  Partially Met      PT LONG TERM GOAL #4   Title  Patient will exhibit improve periscapular strength of grossly 4+/5 MMT to allow for improved postural control.    Period  Weeks    Status  Not Met            Plan - 11/18/19 1159    Clinical Impression Statement  Mitchell Baxter reported he did get relief with the TPDN but did report pain/ stiffness returning despite being consistent with his stretching. pt  noted he wasn't sure if wanted to continued with the TPDN due to the return of symptoms, instead of TPDN focused on Manual trigger point release techniques which he noted significant relief in pain/ muscle tension reporting pain dropped from  a 3/10 to a 1/10. He request to be discharged from PT today and focus on self MTPR using theracane that he plans to purchase, and work on his HEP and see how he responds a few months, if he needed to return he would call his MD for a referral.    PT Next Visit Plan  discharge    PT Home Exercise Plan  Seated chin tucks, thoracic extension with pec stretch over back of chair, banded row, extension, ER, horiz abduction with red band    Consulted and Agree with Plan of Care  Patient       Patient will benefit from skilled therapeutic intervention in order to improve the following deficits and impairments:  Postural dysfunction, Decreased strength, Impaired flexibility, Decreased activity tolerance, Pain, Decreased range of motion  Visit Diagnosis: Cervicalgia  Abnormal posture     Problem List Patient Active Problem List   Diagnosis Date Noted  . CAD (coronary artery disease) 10/15/2019  . Aortic atherosclerosis ([employer]) 10/15/2019  . Tobacco dependence 10/15/2019  . Exertional dyspnea 10/15/2019  . Atypical chest pain 10/15/2019  . Claudication ([place]) 10/15/2019  . OSA and COPD overlap syndrome ([place]) 10/06/2019  . Intractable chronic migraine without aura and with status migrainosus 06/06/2017  . Plantar fasciitis 09/26/2016  . Altered mental status 08/25/2015  . Tremor of right hand 08/25/2015  . Vision changes 08/25/2015  . Worsening headaches 08/25/2015  . New onset of headaches after age 14 08/25/2015  . Sinusitis, chronic 08/25/2015  . Hearing loss 08/25/2015  . Vertigo 08/25/2015  . Lap Nissen June 2013 05/24/2012  . GERD (gastroesophageal reflux disease) 03/28/2012  . Chronic headaches 12/22/2011  . Essential hypertension 12/22/2011  . High cholesterol 12/22/2011  . Depression 12/22/2011    [place] 11/18/2019, 12:08 PM  [facility] [address] [place], [place], [address] Phone: [phone]   Fax:   [phone]  Name: Mitchell Baxter MRN: [id number] Date of Birth: June 26, 1951            PHYSICAL THERAPY DISCHARGE SUMMARY  Visits from Start of Care: 3  Current functional level related to goals / functional outcomes: See goals, FOTO 40% limited   Remaining deficits: Intermittent stiffness in the R upper trap/ sub-occipitals accompanied with pain rated at 3-4/10.    Education / Equipment: HEP, theraband, posture,   Plan: Patient agrees to discharge.  Patient goals were partially met. Patient is being discharged due to the patient's request.  ?????       Yamaira Spinner PT, DPT, LAT, ATC  11/18/19  12:08 PM

## 2019-11-18 NOTE — Progress Notes (Signed)
Negative corona test

## 2019-11-24 ENCOUNTER — Other Ambulatory Visit: Payer: Self-pay

## 2019-11-24 ENCOUNTER — Encounter: Payer: Self-pay | Admitting: Cardiology

## 2019-11-24 ENCOUNTER — Ambulatory Visit: Payer: Medicare HMO | Admitting: Cardiology

## 2019-11-24 VITALS — BP 123/73 | HR 68 | Temp 97.7°F | Resp 16 | Ht 72.0 in | Wt 242.0 lb

## 2019-11-24 DIAGNOSIS — I25118 Atherosclerotic heart disease of native coronary artery with other forms of angina pectoris: Secondary | ICD-10-CM | POA: Diagnosis not present

## 2019-11-24 DIAGNOSIS — R0609 Other forms of dyspnea: Secondary | ICD-10-CM

## 2019-11-24 DIAGNOSIS — I350 Nonrheumatic aortic (valve) stenosis: Secondary | ICD-10-CM | POA: Diagnosis not present

## 2019-11-24 NOTE — Progress Notes (Signed)
Patient referred by Holland Commons, FNP for coronary and aortic atherosclerosis  Subjective:   Mitchell Baxter, male    DOB: 1950/10/31, 69 y.o.   MRN: [id number]   Chief Complaint  Patient presents with  . Follow-up    7-10 day cath    HPI  69 y.o. African American male with hypertension, tobacco dependence, coronary and aortic atherosclerosis, with exertional dyspnea, atypical chest pain.  Coronary angiogram showed nonobstructive coronary artery disease. Patient has not had any recurrent chest pain, but continues to have exertional dyspnea. He does not have any follow up visit scheduled with his PCP.   Current Outpatient Medications on File Prior to Visit  Medication Sig Dispense Refill  . acetaminophen (TYLENOL) 500 MG tablet Take 2,000 mg by mouth 2 (two) times daily as needed for headache.    Marland Kitchen aspirin EC 81 MG tablet Take 1 tablet (81 mg total) by mouth daily. (Patient taking differently: Take 81 mg by mouth at bedtime. ) 30 tablet 3  . atorvastatin (LIPITOR) 40 MG tablet Take 40 mg by mouth at bedtime.     Eduard Roux (AIMOVIG) 140 MG/ML SOAJ Inject 140 mg into the skin every 30 (thirty) days. 1 pen 11  . gabapentin (NEURONTIN) 300 MG capsule Take 300 mg by mouth at bedtime.   6  . rizatriptan (MAXALT-MLT) 10 MG disintegrating tablet Take 1 tablet (10 mg total) by mouth as needed for migraine. May repeat in 2 hours if needed. Max 2x a day 9 tablet 11  . valsartan-hydrochlorothiazide (DIOVAN-HCT) 160-12.5 MG tablet Take 0.5 tablets by mouth at bedtime.      No current facility-administered medications on file prior to visit.    Cardiovascular and other pertinent studies:  Coronary angiography 11/14/2019: LM: No significant disease (Calcium sen in vessel wall, which correlates with finding on CT chest) LAD: Prox LAD/D2 bifurcation 50% disease. dFR 0.93 in mid LAD beyond the lesion LCx: OM1 with ectasia with 20% lesions proximal and distal to the ectatic part.   RCA: Prox and PDA 20% lesions  Normal LVEDP.  Impression: Nonobstructive coronary artery disease. Symptoms of chest pain and shortness of breath unlikely to be due to CAD.  Recommendation: Continue aggressive risk factor modification and medical management for CAD, including Aspirin and statin.   Lexiscan Tetrofosmin Stress Test  11/03/2019: Nondiagnostic ECG stress. Mild diaphragmatic attenuation. No ischemia or scar.  Stress LV EF: 63%.  No previous exam available for comparison. Low risk study.   Echocardiogram 10/29/2019: Left ventricle cavity is normal in size. Mild concentric hypertrophy of the left ventricle. Normal global wall motion. Normal LV systolic function with EF 55%. Normal diastolic filling pattern. Aortic valve is probably trileaflet, with mild calcification. Mild aortic stenosis. Aortic valve mean gradient of 10 mmHg, Vmax of 2.1  m/s. Calculated aortic valve area by continuity equation is 1.9 cm. Inadequate TR jet to estimate pulmonary artery systolic pressure. Normal right atrial pressure.  Compared to previous study in 2017, mild aortic stenosis is new.   ABI 10/28/2019: This exam reveals normal perfusion of the right lower extremity (ABI 1.02).  This exam reveals normal perfusion of the left lower extremity (ABI1.02).  Normal triphasic waveform at the ankle.   EKG 08/20/2019: Sinus rhythm 61 bpm.  Normal EKG.  Recent labs: 08/13/2019: Glucose 99, BUN/Cr 13/1.2. EGFR 85. Na/K 138/4.2. Rest of the CMP normal H/H 16/47. MCV 95. Platelets 202 Chol 159, TG 91, HDL 45, LDL 96 TSH normal  CT Chest  09/12/2019: 1. Lung-RADS 2, benign appearance or behavior. Continue annual screening with low-dose chest CT without contrast in 12 months. 2. Aortic atherosclerosis (ICD10-I70.0), coronary artery atherosclerosis and emphysema (ICD10-J43.9).      Review of Systems  Cardiovascular: Positive for chest pain, claudication and dyspnea on exertion.   Respiratory: Positive for shortness of breath.   All other systems reviewed and are negative.        Vitals:   11/24/19 1332  BP: 123/73  Pulse: 68  Resp: 16  Temp: 97.7 F (36.5 C)  SpO2: 95%     Objective:   Physical Exam  Constitutional: He appears well-developed and well-nourished.  Neck: No JVD present.  Cardiovascular: Normal rate, regular rhythm, normal heart sounds and intact distal pulses.  No murmur heard. Pulmonary/Chest: Effort normal and breath sounds normal. He has no wheezes. He has no rales.  Musculoskeletal:        General: No edema.  Nursing note and vitals reviewed.       Assessment & Recommendations:   69 y.o. African American male with hypertension, tobacco dependence, coronary and aortic atherosclerosis, with exertional dyspnea, atypical chest pain.  CAD: Chest pain atypical. Nonobstructive (dFR negative in LAD) CAD on cath 10/2019. Continue aggressive medical management. Continue Aspirin/stratin. HR, BP well controlled without beta blocker.  Re-emphasized tobacco cessation.  Exertional dyspnea: Nonobstructive CAD. COPD likely etiology. Referred to pulmonology.  Aortic stenosis: Mild. Repeat echocardiogram in 03/2021.  Episodes of shaking: No recurrence. If they recut, seizure should be in the differential.  Tobacco dependence: Re-emphasized smoking cessation.   F/u in 6 months.  Nigel Mormon, MD [facility] Pager: [phone] Office: [phone]

## 2019-11-25 ENCOUNTER — Ambulatory Visit: Payer: Medicare HMO | Admitting: Physical Therapy

## 2019-11-27 DIAGNOSIS — R519 Headache, unspecified: Secondary | ICD-10-CM | POA: Insufficient documentation

## 2019-11-27 DIAGNOSIS — M7918 Myalgia, other site: Secondary | ICD-10-CM | POA: Insufficient documentation

## 2019-11-27 DIAGNOSIS — G4733 Obstructive sleep apnea (adult) (pediatric): Secondary | ICD-10-CM | POA: Insufficient documentation

## 2019-11-27 DIAGNOSIS — G43711 Chronic migraine without aura, intractable, with status migrainosus: Secondary | ICD-10-CM | POA: Insufficient documentation

## 2019-11-27 DIAGNOSIS — M542 Cervicalgia: Secondary | ICD-10-CM | POA: Insufficient documentation

## 2019-11-27 NOTE — Procedures (Signed)
PATIENT'S NAME:  Mitchell Baxter DOB:      09-03-1951      MR#:    [id number]     DATE OF RECORDING: 11/18/2019 AL REFERRING M.D.:  Mitchell Ill, MD  Study Performed:   Baseline Polysomnogram HISTORY:  Chief concern according to patient: " I have migraines and Dr Mitchell Baxter wonders if my sleep apnea is well treated."   I have the pleasure of seeing Mitchell Baxter today, a 69 year old right -handed Caucasian male with a diagnosis of OSA, on CPAP since 2017.He  has a past medical history of COPD (chronic obstructive pulmonary disease) (Mitchell Baxter), Depression, GERD (gastroesophageal reflux disease), Headache(784.0), Hyperlipidemia, Hypertension, Shortness of breath, Sleep apnea, and Vertigo.Marland Kitchen He reports subjective memory loss. Chronic cough, and actively smoking. CPAP check- up.  The machine was ordered through NP Mitchell Baxter in office with Dr. Jani Baxter. The patient had the first sleep study in the year 15 17 through Dr. Julianne Baxter office.      The patient endorsed the Epworth Sleepiness Scale at 9 points.   The patient's weight 240 pounds with a height of 72 (inches), resulting in a BMI of 32.5 kg/m2. The patient's neck circumference measured 17 inches.  CURRENT MEDICATIONS: Lipitor, Tenormin, Neurontin, Maxalt, Diovan, Quodexy   PROCEDURE:  This is a multichannel digital polysomnogram utilizing the Somnostar 11.2 system.  Electrodes and sensors were applied and monitored per AASM Specifications.   EEG, EOG, Chin and Limb EMG, were sampled at 200 Hz.  ECG, Snore and Nasal Pressure, Thermal Airflow, Respiratory Effort, CPAP Flow and Pressure, Oximetry was sampled at 50 Hz. Digital video and audio were recorded.      BASELINE STUDY: Lights Out was at 00:04 and Lights On at 06:32.  Total recording time (TRT) was 388 minutes, with a total sleep time (TST) of 291 minutes.   The patient's sleep latency was 53 minutes.  REM latency was 72.5 minutes.  The sleep efficiency was 75 %.     SLEEP ARCHITECTURE: WASO (Wake after  sleep onset) was 38.5 minutes.  There were 33 minutes in Stage N1, 70 minutes Stage N2, 88 minutes Stage N3 and 100 minutes in Stage REM.  The percentage of Stage N1 was 11.3%, Stage N2 was 24.1%, Stage N3 was 30.2% and Stage R (REM sleep) was 34.4%.  :  There were a total of 18 respiratory events:  9 obstructive apneas, 0 central apneas and 0 mixed apneas with a total of 9 apneas and an apnea index (AI) of 1.9 /hour. There were 9 hypopneas with a hypopnea index of 1.9 /hour.  The total APNEA/HYPOPNEA INDEX (AHI) was 3.7/hour.  18 events occurred in REM sleep and 0 events in NREM. The REM AHI was 10.8 /hour, versus a non-REM AHI of 0. The patient spent 32 minutes of total sleep time in the supine position and 259 minutes in non-supine. The supine AHI was 30.0/h versus a non-supine AHI of 0.5.  OXYGEN SATURATION & C02:  The Wake baseline 02 saturation was 96%, with the lowest being 85%. Time spent below 89% saturation equaled 7 minutes.  The arousals were noted as: 45 were spontaneous, 0 were associated with PLMs, 10 were associated with respiratory events. Audio and video analysis did not show any abnormal or unusual movements, behaviors, phonations or vocalizations.   Snoring was noted. EKG was in keeping with normal sinus rhythm (NSR).  IMPRESSION:  1. No clinically significant degree of OSA overall, the AHI was under 5/h and  would not be considered in need of treatment. There was no significant hypoxia, no PLMs and no a cardiac arrhythmia.    RECOMMENDATIONS:  1. Advise to avoid supine sleep, and to refer for behavioral psychology for smoking cessation. 2.  A revisit with the sleep clinic is not needed.   I certify that I have reviewed the entire raw data recording prior to the issuance of this report in accordance with the Standards of Accreditation of the American Academy of Sleep Medicine (AASM)     Mitchell Seat, MD  11-27-2019 Diplomat, American Board of Psychiatry and Neurology   Diplomat, American Board of [place] Director, [place] [facility] at [facility]

## 2019-11-27 NOTE — Progress Notes (Signed)
IMPRESSION:   1. No clinically significant degree of OSA overall, the AHI was  under 5/h and would not be considered in need of treatment. There  was no significant hypoxia, no PLMs and no a cardiac arrhythmia.    RECOMMENDATIONS:   1. Advise to avoid supine sleep, and to refer for behavioral  psychology for smoking cessation.  2. A revisit with the sleep clinic is not needed.

## 2019-12-01 ENCOUNTER — Telehealth: Payer: Self-pay | Admitting: Neurology

## 2019-12-01 NOTE — Telephone Encounter (Signed)
-----   Message from Larey Seat, MD sent at 11/27/2019  5:04 PM EST ----- IMPRESSION:   1. No clinically significant degree of OSA overall, the AHI was  under 5/h and would not be considered in need of treatment. There  was no significant hypoxia, no PLMs and no a cardiac arrhythmia.    RECOMMENDATIONS:   1. Advise to avoid supine sleep, and to refer for behavioral  psychology for smoking cessation.  2. A revisit with the sleep clinic is not needed.

## 2019-12-01 NOTE — Telephone Encounter (Signed)
Called the patient and he was not available. I was able to talk with the patient's wife and review the sleep study with her. Patient was a CPAP user since 2017. Based off this sleep study his apnea was minimal and does not require treatment with a CPAP machine any longer. Advised that the patient should avoid sleeping on back and reviewed techniques to help with this. Informed her that if insomnia is of concern she would recommend cognitive behavior therapy. She asked about getting a new machine and I advised based off the results of this study this would indicate he does not need a machine any longer. The main thing would be to avoid sleeping on his back. She verbalized understanding. Pt's wife had no questions at this time but was encouraged to call back if questions arise as well as the patient to call if needed.

## 2019-12-30 ENCOUNTER — Ambulatory Visit: Payer: Medicare Other | Admitting: Neurology

## 2020-01-22 ENCOUNTER — Encounter: Payer: Medicare HMO | Attending: Psychology | Admitting: Psychology

## 2020-05-17 ENCOUNTER — Ambulatory Visit: Payer: Medicare HMO | Admitting: Cardiology

## 2020-05-17 NOTE — Progress Notes (Signed)
No show

## 2020-05-19 DIAGNOSIS — Z91199 Patient's noncompliance with other medical treatment and regimen due to unspecified reason: Secondary | ICD-10-CM | POA: Insufficient documentation

## 2020-05-24 ENCOUNTER — Ambulatory Visit: Payer: Medicare HMO | Admitting: Cardiology

## 2020-08-30 ENCOUNTER — Telehealth: Payer: Self-pay | Admitting: Neurology

## 2020-08-30 NOTE — Telephone Encounter (Signed)
If patient does call back, please review previous phone note from earlier in the year. Based off the last sleep study that was completed the patient would not meet the criteria for a new CPAP. Insurance would not cover the new machine.

## 2020-08-30 NOTE — Telephone Encounter (Addendum)
Wife(on DPR-Garfield,Janice) let office voicemail stating pt is in year 5 of his CPAP and it is worn out.  Wife is asking for a new CPAP for pt.  Wife was advised pt would need to schedule a f/u.  Wife was told she would need to speak with billingcas 1st due to their being a bad debt balance on file.  Wife agreed to be connected to billing.  Wife or pt will call back to schedule a f/u once bad debt balance has been addressed. This is FYI, no call back requested

## 2020-11-30 ENCOUNTER — Telehealth: Payer: Self-pay

## 2020-11-30 NOTE — Telephone Encounter (Signed)
PA request for Aimovig was approved by [facility] from October 30, 2020 to October 29, 2021 by [facility].

## 2020-11-30 NOTE — Telephone Encounter (Signed)
Received PA request for Aimovig.  Completed via CMM.  Should have a determination within 1-3 business days. Key: [id number]

## 2020-12-20 ENCOUNTER — Other Ambulatory Visit: Payer: Self-pay | Admitting: Internal Medicine

## 2020-12-20 DIAGNOSIS — F17299 Nicotine dependence, other tobacco product, with unspecified nicotine-induced disorders: Secondary | ICD-10-CM

## 2020-12-30 ENCOUNTER — Ambulatory Visit
Admission: RE | Admit: 2020-12-30 | Discharge: 2020-12-30 | Disposition: A | Discharge status: Home / Self Care | Payer: Medicare HMO | Source: Ambulatory Visit | Attending: Internal Medicine | Admitting: Internal Medicine

## 2020-12-30 DIAGNOSIS — F17299 Nicotine dependence, other tobacco product, with unspecified nicotine-induced disorders: Secondary | ICD-10-CM

## 2021-01-17 ENCOUNTER — Institutional Professional Consult (permissible substitution): Payer: Medicare HMO | Admitting: Thoracic Surgery (Cardiothoracic Vascular Surgery)

## 2021-01-17 ENCOUNTER — Other Ambulatory Visit: Payer: Self-pay

## 2021-01-17 ENCOUNTER — Other Ambulatory Visit: Payer: Self-pay | Admitting: Thoracic Surgery (Cardiothoracic Vascular Surgery)

## 2021-01-17 ENCOUNTER — Encounter: Payer: Self-pay | Admitting: Thoracic Surgery (Cardiothoracic Vascular Surgery)

## 2021-01-17 VITALS — BP 119/76 | HR 85 | Resp 20 | Wt 256.0 lb

## 2021-01-17 DIAGNOSIS — R911 Solitary pulmonary nodule: Secondary | ICD-10-CM

## 2021-01-17 DIAGNOSIS — I350 Nonrheumatic aortic (valve) stenosis: Secondary | ICD-10-CM

## 2021-01-17 NOTE — Progress Notes (Signed)
PCP is Adria Dill, Leonia Reader, FNP Referring Provider is Jani Gravel, MD  Chief Complaint  Patient presents with  . Lung Lesion    Initial surgical consult CT chest 3/3    HPI: Mitchell Baxter is sent for consultation regarding a suspicious right lower lobe lung nodule.  Mitchell Baxter is a 70 year old man with a history of tobacco abuse, COPD, hypertension, hyperlipidemia, severe migraines, sleep apnea, vertigo, reflux with previous hiatal hernia repair, and depression.  He has smoked about 1 pack of cigarettes daily for 40 years.  He has been followed with low-dose CT for lung cancer screening.  He had a scan in November 2020 which showed a 13 mm groundglass opacity in the superior segment of the right lower lobe.  He recently had a 1 year follow-up scan which showed the nodule had increased in size to 17 mm and there was an 8 mm solid component.  He quit smoking 2 weeks ago when his CT results became available.  He has a chronic cough and occasional wheezing but does not use any inhalers.  He does not get short of breath with routine activities.  He does use CPAP at night for sleep apnea.  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  He has gained about 15 pounds over the past 45months.  He complains of decreased energy.  He has chronic migraine headaches.  Zubrod Score: At the time of surgery this patient's most appropriate activity status/level should be described as: []     0    Normal activity, no symptoms [x]     1    Restricted in physical strenuous activity but ambulatory, able to do out light work []     2    Ambulatory and capable of self care, unable to do work activities, up and about >50 % of waking hours                              []     3    Only limited self care, in bed greater than 50% of waking hours []     4    Completely disabled, no self care, confined to bed or chair []     5    Moribund  Past Medical History:  Diagnosis Date  . Chronic migraine w/o aura, not  intractable, w stat migr   . COPD (chronic obstructive pulmonary disease) ([place])   . Depression   . GERD (gastroesophageal reflux disease)   . Headache(784.0)    migraine  . Hyperlipidemia   . Hypertension   . Shortness of breath    exertion  . Sleep apnea    stopbang=4  . Vertigo     Past Surgical History:  Procedure Laterality Date  . BRAVO [place] STUDY  11/29/2011   Procedure: BRAVO [place];  Surgeon: Landry Dyke, MD;  [facility];  Service: Endoscopy;  Laterality: N/A;  . COLONOSCOPY  10/03/2012   Procedure: COLONOSCOPY;  Surgeon: Shann Medal, MD;  Location: [facility];  Service: General;  Laterality: N/A;  carol/leone  . HIATAL HERNIA REPAIR  04/16/2012   Procedure: LAPAROSCOPIC REPAIR OF HIATAL HERNIA;  Surgeon: Pedro Earls, MD;  [facility]: [facility];  Service: General;;  . INTRAVASCULAR PRESSURE WIRE/FFR STUDY N/A 11/14/2019   Procedure: INTRAVASCULAR PRESSURE WIRE/FFR STUDY;  Surgeon: Nigel Mormon, MD;  Location: [facility];  Service: Cardiovascular;  Laterality: N/A;  . LAPAROSCOPIC  NISSEN FUNDOPLICATION  7/00/1749   Procedure: LAPAROSCOPIC NISSEN FUNDOPLICATION;  Surgeon: Pedro Earls, MD;  [facility]: [facility];  Service: General;  Laterality: N/A;  . LEFT HEART CATH AND CORONARY ANGIOGRAPHY N/A 11/14/2019   Procedure: LEFT HEART CATH AND CORONARY ANGIOGRAPHY;  Surgeon: Nigel Mormon, MD;  Location: [place] [facility];  Service: Cardiovascular;  Laterality: N/A;  . none      Family History  Problem Relation Age of Onset  . Colon cancer Father        pancreatic  . Anesthesia problems Neg Hx   . Hypotension Neg Hx   . Malignant hyperthermia Neg Hx   . Pseudochol deficiency Neg Hx   . Migraines Neg Hx     Social History Social History   Tobacco Use  . Smoking status: Current Every Day Smoker    Packs/day: 1.00    Years: 40.00    Pack years: 40.00    Types: Cigarettes  . Smokeless tobacco: Never Used  Substance Use Topics   . Alcohol use: Yes    Comment: occasional  . Drug use: No    Current Outpatient Medications  Medication Sig Dispense Refill  . acetaminophen (TYLENOL) 500 MG tablet Take 2,000 mg by mouth 2 (two) times daily as needed for headache.    Marland Kitchen aspirin EC 81 MG tablet Take 1 tablet (81 mg total) by mouth daily. (Patient taking differently: Take 81 mg by mouth at bedtime.) 30 tablet 3  . atorvastatin (LIPITOR) 40 MG tablet Take 40 mg by mouth at bedtime.     . gabapentin (NEURONTIN) 300 MG capsule Take 300 mg by mouth at bedtime.   6  . valsartan-hydrochlorothiazide (DIOVAN-HCT) 160-12.5 MG tablet Take 0.5 tablets by mouth at bedtime.     Mitchell Baxter (AIMOVIG) 140 MG/ML SOAJ Inject 140 mg into the skin every 30 (thirty) days. (Patient not taking: Reported on 01/17/2021) 1 pen 11  . rizatriptan (MAXALT-MLT) 10 MG disintegrating tablet Take 1 tablet (10 mg total) by mouth as needed for migraine. May repeat in 2 hours if needed. Max 2x a day (Patient not taking: Reported on 01/17/2021) 9 tablet 11   No current facility-administered medications for this visit.    No Known Allergies  Review of Systems  Constitutional: Positive for fatigue and unexpected weight change (gained 15 pounds in 6 months). Negative for activity change and appetite change.  Eyes: Positive for visual disturbance.  Respiratory: Positive for apnea (CPAP), choking, shortness of breath and wheezing.   Cardiovascular: Negative for chest pain and leg swelling.  Gastrointestinal: Positive for abdominal pain and constipation. Negative for abdominal distention.  Genitourinary: Negative for difficulty urinating and dysuria.  Musculoskeletal: Positive for arthralgias, gait problem and myalgias.  Neurological: Positive for headaches (migraines). Negative for weakness.       Short term memory loss  Hematological: Negative for adenopathy. Does not bruise/bleed easily.  All other systems reviewed and are negative.   BP 119/76 (BP  Location: Right Arm, Patient Position: Sitting)   Pulse 85   Resp 20   Wt 256 lb (116.1 kg)   SpO2 98% Comment: RA  BMI 34.72 kg/m  Physical Exam Vitals reviewed.  Constitutional:      General: He is not in acute distress.    Appearance: He is obese.  HENT:     Head: Normocephalic and atraumatic.  Eyes:     General: No scleral icterus.    Extraocular Movements: Extraocular movements intact.  Neck:  Vascular: Carotid bruit present.  Cardiovascular:     Rate and Rhythm: Normal rate and regular rhythm.     Heart sounds: Murmur (2/6 systolic) heard.    Pulmonary:     Effort: Pulmonary effort is normal. No respiratory distress.     Breath sounds: Normal breath sounds. No wheezing or rales.  Abdominal:     General: There is no distension.     Palpations: Abdomen is soft.     Tenderness: There is no abdominal tenderness.  Musculoskeletal:     Cervical back: Neck supple.  Lymphadenopathy:     Cervical: No cervical adenopathy.  Skin:    General: Skin is warm and dry.  Neurological:     General: No focal deficit present.     Mental Status: He is alert and oriented to person, place, and time.     Cranial Nerves: No cranial nerve deficit.     Motor: No weakness.     Diagnostic Tests: CT CHEST WITHOUT CONTRAST LOW-DOSE FOR LUNG CANCER SCREENING  TECHNIQUE: Multidetector CT imaging of the chest was performed following the standard protocol without IV contrast.  COMPARISON:  09/12/2019.  FINDINGS: Cardiovascular: Atherosclerotic calcification of the aorta, aortic valve and coronary arteries. Heart size normal. No pericardial effusion.  Mediastinum/Nodes: No pathologically enlarged mediastinal or axillary lymph nodes. Hilar regions are difficult to definitively evaluate without IV contrast but appear grossly unremarkable. Esophagus is grossly unremarkable.  Lungs/Pleura: Centrilobular and paraseptal emphysema. Smoking related respiratory bronchiolitis.  Calcified granulomas. A ground-glass nodule in the superior segment right lower lobe measures 17.7 cm (previously 13.2 mm) with a new internal solid component measuring 8.0 mm (3/137). Additional pulmonary nodules measure 4.5 mm or less in size. No pleural fluid. Debris is seen in the trachea.  Upper Abdomen: Visualized portions of the liver, gallbladder, adrenal glands, kidneys, spleen, pancreas, stomach and bowel are unremarkable.  Musculoskeletal: No worrisome lytic or sclerotic lesions.  IMPRESSION: 1. Enlarging right lower lobe ground-glass nodule with a new solid internal component measuring 8 mm. Lung-RADS 4B, suspicious. Additional imaging evaluation or consultation with Pulmonology or Thoracic Surgery recommended. These results will be called to the ordering clinician or representative by the Radiologist Assistant, and communication documented in the PACS or [employer]. 2. Aortic atherosclerosis (ICD10-I70.0). Coronary artery calcification. 3.  Emphysema (ICD10-J43.9).   Electronically Signed   By: Lorin Picket M.D.   On: 12/31/2020 11:39  I personally reviewed the CT images and concur with the findings of a 17 mm mixed density nodule with an 8 mm solid component in the superior segment of the right lower lobe, centrilobular and paraseptal emphysema, and aortic and coronary calcification.  Impression: Mitchell Baxter is a 70 year old man with a history of tobacco abuse, COPD, hypertension, hyperlipidemia, severe migraines, sleep apnea, vertigo, reflux with previous hiatal hernia repair, and depression.   He was first found to have a groundglass opacity in the right lower lobe in late 2020.  Follow-up scan a couple of weeks ago showed an increase in size of the groundglass opacity and a new solid component.  I reviewed the images with Mr. Mrs. Koltz.  We discussed the differential diagnosis.  Given his age, appearance of the nodule, progression over time, and  history of tobacco abuse this nodule is most likely a new primary bronchogenic carcinoma.  Infectious and inflammatory nodules are also in the differential diagnosis.  I recommended that we do a PET/CT to assess for metabolic activity and also ensure there is no other sites  of disease.  I will help guide our initial diagnostic work-up.  He does need pulmonary function testing although this does appear to be treatable with segmentectomy and should have very limited impact on his respiratory capacity.  He had an echocardiogram in December 2020 which showed mild aortic stenosis with a calculated valve area of 1.9 cm.  I think we should repeat that and make sure there is not been any progression.  He also had cardiac catheterization in January 2021 which showed no hemodynamically significant coronary disease.  He is not having any chest pain so I do not think any further cardiac work-up is warranted currently.  Assuming the PET does not show any other suspicious sites my recommendation would be to proceed with a robotic right lower lobe superior segmentectomy.  I informed Mr. and Mrs. Beckley of the general nature of the procedure, the need for general anesthesia, the incisions to be used, the use of a drainage tube postoperatively, the expected hospital stay, and the overall recovery.  I informed him of the indications, risks, benefits, and alternatives.  They understand the risks include, but are not limited to death, MI, DVT, PE, bleeding, possible need for transfusion, infection, prolonged air leak, cardiac arrhythmias, as well as the possibility of other complications.  We did discuss the alternatives of stereotactic radiation.  He is not interested in pursuing that if surgery is possible.  Plan: 2D echocardiogram to reassess previously mild aortic stenosis  Pulmonary function testing with and without bronchodilators PET/CT for suspicious lung nodule, guide initial diagnostic work-up Tentative plan  for robotic right lower lobe superior segmentectomy on Monday, 01/31/2021  Melrose Nakayama, MD [facility] [phone]

## 2021-01-17 NOTE — H&P (View-Only) (Signed)
PCP is Adria Dill, Le[place] Reader, FNP Referring Provider is Jani Gravel, MD  Chief Complaint  Patient presents with  . Lung Lesion    Initial surgical consult CT chest 3/3    HPI: Mr. Stroble is sent for consultation regarding a suspicious right lower lobe lung nodule.  Jarrius Huaracha is a 70 year old man with a history of tobacco abuse, COPD, hypertension, hyperlipidemia, severe migraines, sleep apnea, vertigo, reflux with previous hiatal hernia repair, and depression.  He has smoked about 1 pack of cigarettes daily for 40 years.  He has been followed with low-dose CT for lung cancer screening.  He had a scan in November 2020 which showed a 13 mm groundglass opacity in the superior segment of the right lower lobe.  He recently had a 1 year follow-up scan which showed the nodule had increased in size to 17 mm and there was an 8 mm solid component.  He quit smoking 2 weeks ago when his CT results became available.  He has a chronic cough and occasional wheezing but does not use any inhalers.  He does not get short of breath with routine activities.  He does use CPAP at night for sleep apnea.  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  He has gained about 15 pounds over the past 58months.  He complains of decreased energy.  He has chronic migraine headaches.  Zubrod Score: At the time of surgery this patient's most appropriate activity status/level should be described as: []     0    Normal activity, no symptoms [x]     1    Restricted in physical strenuous activity but ambulatory, able to do out light work []     2    Ambulatory and capable of self care, unable to do work activities, up and about >50 % of waking hours                              []     3    Only limited self care, in bed greater than 50% of waking hours []     4    Completely disabled, no self care, confined to bed or chair []     5    Moribund  Past Medical History:  Diagnosis Date  . Chronic migraine w/o aura, not  intractable, w stat migr   . COPD (chronic obstructive pulmonary disease) ([place])   . Depression   . GERD (gastroesophageal reflux disease)   . Headache(784.0)    migraine  . Hyperlipidemia   . Hypertension   . Shortness of breath    exertion  . Sleep apnea    stopbang=4  . Vertigo     Past Surgical History:  Procedure Laterality Date  . BRAVO [place] STUDY  11/29/2011   Procedure: BRAVO [place];  Surgeon: Landry Dyke, MD;  [facility];  Service: Endoscopy;  Laterality: N/A;  . COLONOSCOPY  10/03/2012   Procedure: COLONOSCOPY;  Surgeon: Shann Medal, MD;  Location: [facility];  Service: General;  Laterality: N/A;  carol/leone  . HIATAL HERNIA REPAIR  04/16/2012   Procedure: LAPAROSCOPIC REPAIR OF HIATAL HERNIA;  Surgeon: Pedro Earls, MD;  [facility];  Service: General;;  . INTRAVASCULAR PRESSURE WIRE/FFR STUDY N/A 11/14/2019   Procedure: INTRAVASCULAR PRESSURE WIRE/FFR STUDY;  Surgeon: Nigel Mormon, MD;  Location: [place] [facility];  Service: Cardiovascular;  Laterality: N/A;  . LAPAROSCOPIC  NISSEN FUNDOPLICATION  1/61/0960   Procedure: LAPAROSCOPIC NISSEN FUNDOPLICATION;  Surgeon: Pedro Earls, MD;  [facility];  Service: General;  Laterality: N/A;  . LEFT HEART CATH AND CORONARY ANGIOGRAPHY N/A 11/14/2019   Procedure: LEFT HEART CATH AND CORONARY ANGIOGRAPHY;  Surgeon: Nigel Mormon, MD;  Location: [facility];  Service: Cardiovascular;  Laterality: N/A;  . none      Family History  Problem Relation Age of Onset  . Colon cancer Father        pancreatic  . Anesthesia problems Neg Hx   . Hypotension Neg Hx   . Malignant hyperthermia Neg Hx   . Pseudochol deficiency Neg Hx   . Migraines Neg Hx     Social History Social History   Tobacco Use  . Smoking status: Current Every Day Smoker    Packs/day: 1.00    Years: 40.00    Pack years: 40.00    Types: Cigarettes  . Smokeless tobacco: Never Used  Substance Use Topics   . Alcohol use: Yes    Comment: occasional  . Drug use: No    Current Outpatient Medications  Medication Sig Dispense Refill  . acetaminophen (TYLENOL) 500 MG tablet Take 2,000 mg by mouth 2 (two) times daily as needed for headache.    Marland Kitchen aspirin EC 81 MG tablet Take 1 tablet (81 mg total) by mouth daily. (Patient taking differently: Take 81 mg by mouth at bedtime.) 30 tablet 3  . atorvastatin (LIPITOR) 40 MG tablet Take 40 mg by mouth at bedtime.     . gabapentin (NEURONTIN) 300 MG capsule Take 300 mg by mouth at bedtime.   6  . valsartan-hydrochlorothiazide (DIOVAN-HCT) 160-12.5 MG tablet Take 0.5 tablets by mouth at bedtime.     Eduard Roux (AIMOVIG) 140 MG/ML SOAJ Inject 140 mg into the skin every 30 (thirty) days. (Patient not taking: Reported on 01/17/2021) 1 pen 11  . rizatriptan (MAXALT-MLT) 10 MG disintegrating tablet Take 1 tablet (10 mg total) by mouth as needed for migraine. May repeat in 2 hours if needed. Max 2x a day (Patient not taking: Reported on 01/17/2021) 9 tablet 11   No current facility-administered medications for this visit.    No Known Allergies  Review of Systems  Constitutional: Positive for fatigue and unexpected weight change (gained 15 pounds in 6 months). Negative for activity change and appetite change.  Eyes: Positive for visual disturbance.  Respiratory: Positive for apnea (CPAP), choking, shortness of breath and wheezing.   Cardiovascular: Negative for chest pain and leg swelling.  Gastrointestinal: Positive for abdominal pain and constipation. Negative for abdominal distention.  Genitourinary: Negative for difficulty urinating and dysuria.  Musculoskeletal: Positive for arthralgias, gait problem and myalgias.  Neurological: Positive for headaches (migraines). Negative for weakness.       Short term memory loss  Hematological: Negative for adenopathy. Does not bruise/bleed easily.  All other systems reviewed and are negative.   BP 119/76 (BP  Location: Right Arm, Patient Position: Sitting)   Pulse 85   Resp 20   Wt 256 lb (116.1 kg)   SpO2 98% Comment: RA  BMI 34.72 kg/m  Physical Exam Vitals reviewed.  Constitutional:      General: He is not in acute distress.    Appearance: He is obese.  HENT:     Head: Normocephalic and atraumatic.  Eyes:     General: No scleral icterus.    Extraocular Movements: Extraocular movements intact.  Neck:  Vascular: Carotid bruit present.  Cardiovascular:     Rate and Rhythm: Normal rate and regular rhythm.     Heart sounds: Murmur (2/6 systolic) heard.    Pulmonary:     Effort: Pulmonary effort is normal. No respiratory distress.     Breath sounds: Normal breath sounds. No wheezing or rales.  Abdominal:     General: There is no distension.     Palpations: Abdomen is soft.     Tenderness: There is no abdominal tenderness.  Musculoskeletal:     Cervical back: Neck supple.  Lymphadenopathy:     Cervical: No cervical adenopathy.  Skin:    General: Skin is warm and dry.  Neurological:     General: No focal deficit present.     Mental Status: He is alert and oriented to person, place, and time.     Cranial Nerves: No cranial nerve deficit.     Motor: No weakness.     Diagnostic Tests: CT CHEST WITHOUT CONTRAST LOW-DOSE FOR LUNG CANCER SCREENING  TECHNIQUE: Multidetector CT imaging of the chest was performed following the standard protocol without IV contrast.  COMPARISON:  09/12/2019.  FINDINGS: Cardiovascular: Atherosclerotic calcification of the aorta, aortic valve and coronary arteries. Heart size normal. No pericardial effusion.  Mediastinum/Nodes: No pathologically enlarged mediastinal or axillary lymph nodes. Hilar regions are difficult to definitively evaluate without IV contrast but appear grossly unremarkable. Esophagus is grossly unremarkable.  Lungs/Pleura: Centrilobular and paraseptal emphysema. Smoking related respiratory bronchiolitis.  Calcified granulomas. A ground-glass nodule in the superior segment right lower lobe measures 17.7 cm (previously 13.2 mm) with a new internal solid component measuring 8.0 mm (3/137). Additional pulmonary nodules measure 4.5 mm or less in size. No pleural fluid. Debris is seen in the trachea.  Upper Abdomen: Visualized portions of the liver, gallbladder, adrenal glands, kidneys, spleen, pancreas, stomach and bowel are unremarkable.  Musculoskeletal: No worrisome lytic or sclerotic lesions.  IMPRESSION: 1. Enlarging right lower lobe ground-glass nodule with a new solid internal component measuring 8 mm. Lung-RADS 4B, suspicious. Additional imaging evaluation or consultation with Pulmonology or Thoracic Surgery recommended. These results will be called to the ordering clinician or representative by the Radiologist Assistant, and communication documented in the PACS or [employer]. 2. Aortic atherosclerosis (ICD10-I70.0). Coronary artery calcification. 3.  Emphysema (ICD10-J43.9).   Electronically Signed   By: Lorin Picket M.D.   On: 12/31/2020 11:39  I personally reviewed the CT images and concur with the findings of a 17 mm mixed density nodule with an 8 mm solid component in the superior segment of the right lower lobe, centrilobular and paraseptal emphysema, and aortic and coronary calcification.  Impression: Mitchell Baxter is a 70 year old man with a history of tobacco abuse, COPD, hypertension, hyperlipidemia, severe migraines, sleep apnea, vertigo, reflux with previous hiatal hernia repair, and depression.   He was first found to have a groundglass opacity in the right lower lobe in late 2020.  Follow-up scan a couple of weeks ago showed an increase in size of the groundglass opacity and a new solid component.  I reviewed the images with Mr. Mrs. Felkins.  We discussed the differential diagnosis.  Given his age, appearance of the nodule, progression over time, and  history of tobacco abuse this nodule is most likely a new primary bronchogenic carcinoma.  Infectious and inflammatory nodules are also in the differential diagnosis.  I recommended that we do a PET/CT to assess for metabolic activity and also ensure there is no other sites  of disease.  I will help guide our initial diagnostic work-up.  He does need pulmonary function testing although this does appear to be treatable with segmentectomy and should have very limited impact on his respiratory capacity.  He had an echocardiogram in December 2020 which showed mild aortic stenosis with a calculated valve area of 1.9 cm.  I think we should repeat that and make sure there is not been any progression.  He also had cardiac catheterization in January 2021 which showed no hemodynamically significant coronary disease.  He is not having any chest pain so I do not think any further cardiac work-up is warranted currently.  Assuming the PET does not show any other suspicious sites my recommendation would be to proceed with a robotic right lower lobe superior segmentectomy.  I informed Mr. and Mrs. Coyt of the general nature of the procedure, the need for general anesthesia, the incisions to be used, the use of a drainage tube postoperatively, the expected hospital stay, and the overall recovery.  I informed him of the indications, risks, benefits, and alternatives.  They understand the risks include, but are not limited to death, MI, DVT, PE, bleeding, possible need for transfusion, infection, prolonged air leak, cardiac arrhythmias, as well as the possibility of other complications.  We did discuss the alternatives of stereotactic radiation.  He is not interested in pursuing that if surgery is possible.  Plan: 2D echocardiogram to reassess previously mild aortic stenosis  Pulmonary function testing with and without bronchodilators PET/CT for suspicious lung nodule, guide initial diagnostic work-up Tentative plan  for robotic right lower lobe superior segmentectomy on Monday, 01/31/2021  Melrose Nakayama, MD [facility] [phone]

## 2021-01-18 ENCOUNTER — Encounter: Payer: Self-pay | Admitting: *Deleted

## 2021-01-18 ENCOUNTER — Other Ambulatory Visit: Payer: Self-pay | Admitting: *Deleted

## 2021-01-18 ENCOUNTER — Other Ambulatory Visit: Payer: Self-pay | Admitting: Thoracic Surgery (Cardiothoracic Vascular Surgery)

## 2021-01-18 DIAGNOSIS — R911 Solitary pulmonary nodule: Secondary | ICD-10-CM

## 2021-01-18 DIAGNOSIS — I350 Nonrheumatic aortic (valve) stenosis: Secondary | ICD-10-CM

## 2021-01-24 ENCOUNTER — Other Ambulatory Visit
Admission: RE | Admit: 2021-01-24 | Discharge: 2021-01-24 | Disposition: A | Discharge status: Home / Self Care | Payer: Medicare HMO | Source: Ambulatory Visit | Attending: Thoracic Surgery (Cardiothoracic Vascular Surgery) | Admitting: Thoracic Surgery (Cardiothoracic Vascular Surgery)

## 2021-01-24 ENCOUNTER — Other Ambulatory Visit: Payer: Self-pay

## 2021-01-24 DIAGNOSIS — Z20822 Contact with and (suspected) exposure to covid-19: Secondary | ICD-10-CM | POA: Insufficient documentation

## 2021-01-24 DIAGNOSIS — Z01812 Encounter for preprocedural laboratory examination: Secondary | ICD-10-CM | POA: Insufficient documentation

## 2021-01-24 LAB — SARS CORONAVIRUS 2 (TAT 6-24 HRS): SARS Coronavirus 2: NEGATIVE

## 2021-01-26 ENCOUNTER — Ambulatory Visit (HOSPITAL_COMMUNITY)
Admission: RE | Admit: 2021-01-26 | Discharge: 2021-01-26 | Disposition: A | Discharge status: Home / Self Care | Payer: Medicare HMO | Source: Ambulatory Visit | Attending: Thoracic Surgery (Cardiothoracic Vascular Surgery) | Admitting: Thoracic Surgery (Cardiothoracic Vascular Surgery)

## 2021-01-26 ENCOUNTER — Other Ambulatory Visit: Payer: Self-pay

## 2021-01-26 DIAGNOSIS — I10 Essential (primary) hypertension: Secondary | ICD-10-CM | POA: Insufficient documentation

## 2021-01-26 DIAGNOSIS — G4730 Sleep apnea, unspecified: Secondary | ICD-10-CM | POA: Insufficient documentation

## 2021-01-26 DIAGNOSIS — E785 Hyperlipidemia, unspecified: Secondary | ICD-10-CM | POA: Insufficient documentation

## 2021-01-26 DIAGNOSIS — I700 Atherosclerosis of aorta: Secondary | ICD-10-CM | POA: Diagnosis not present

## 2021-01-26 DIAGNOSIS — I350 Nonrheumatic aortic (valve) stenosis: Secondary | ICD-10-CM | POA: Diagnosis present

## 2021-01-26 DIAGNOSIS — F17200 Nicotine dependence, unspecified, uncomplicated: Secondary | ICD-10-CM | POA: Diagnosis not present

## 2021-01-26 DIAGNOSIS — R911 Solitary pulmonary nodule: Secondary | ICD-10-CM

## 2021-01-26 DIAGNOSIS — J449 Chronic obstructive pulmonary disease, unspecified: Secondary | ICD-10-CM | POA: Diagnosis not present

## 2021-01-26 LAB — PULMONARY FUNCTION TEST
DL/VA % pred: 87 %
DL/VA: 3.5 ml/min/mmHg/L
DLCO unc % pred: 81 %
DLCO unc: 22.32 ml/min/mmHg
FEF 25-75 Post: 1.87 L/sec
FEF 25-75 Pre: 1.53 L/sec
FEF2575-%Change-Post: 22 %
FEF2575-%Pred-Post: 71 %
FEF2575-%Pred-Pre: 58 %
FEV1-%Change-Post: 8 %
FEV1-%Pred-Post: 75 %
FEV1-%Pred-Pre: 69 %
FEV1-Post: 2.64 L
FEV1-Pre: 2.42 L
FEV1FVC-%Change-Post: -7 %
FEV1FVC-%Pred-Pre: 89 %
FEV6-%Change-Post: 14 %
FEV6-%Pred-Post: 94 %
FEV6-%Pred-Pre: 82 %
FEV6-Post: 4.22 L
FEV6-Pre: 3.68 L
FEV6FVC-%Change-Post: -3 %
FEV6FVC-%Pred-Post: 101 %
FEV6FVC-%Pred-Pre: 105 %
FVC-%Change-Post: 18 %
FVC-%Pred-Post: 92 %
FVC-%Pred-Pre: 78 %
FVC-Post: 4.39 L
FVC-Pre: 3.71 L
Post FEV1/FVC ratio: 60 %
Post FEV6/FVC ratio: 96 %
Pre FEV1/FVC ratio: 65 %
Pre FEV6/FVC Ratio: 99 %
RV % pred: 170 %
RV: 4.4 L
TLC % pred: 117 %
TLC: 8.71 L

## 2021-01-26 LAB — ECHOCARDIOGRAM COMPLETE
AR max vel: 2.03 cm2
AV Area VTI: 1.9 cm2
AV Area mean vel: 1.92 cm2
AV Mean grad: 9 mmHg
AV Peak grad: 16.2 mmHg
Ao pk vel: 2.02 m/s
Area-P 1/2: 2.91 cm2
S' Lateral: 4 cm

## 2021-01-26 MED ORDER — ALBUTEROL SULFATE (2.5 MG/3ML) 0.083% IN NEBU
2.5000 mg | INHALATION_SOLUTION | Freq: Once | RESPIRATORY_TRACT | Status: AC
  Administered 2021-01-26: 2.5 mg via RESPIRATORY_TRACT

## 2021-01-26 NOTE — Progress Notes (Signed)
Surgical Instructions    Your procedure is scheduled on 01/31/21.  Report to [facility] Main Entrance "A" at 06:00 A.M., then check in with the Admitting office.  Call this number if you have problems the morning of surgery:  [phone]   If you have any questions prior to your surgery date call [phone]: Open Monday-Friday 8am-4pm    Remember:  Do not eat or drink after midnight the night before your surgery     Take these medicines the morning of surgery with A SIP OF WATER  FLUoxetine (PROZAC)  gabapentin (NEURONTIN)  omeprazole (PRILOSEC) rizatriptan (MAXALT-MLT) if needed   As of today, STOP taking any Aspirin (unless otherwise instructed by your surgeon) Aleve, Naproxen, Ibuprofen, Motrin, Advil, Goody's, BC's, all herbal medications, fish oil, and all vitamins.                     Do not wear jewelry, make up, or nail polish            Do not wear lotions, powders, perfumes/colognes, or deodorant.            Men may shave face and neck.            Do not bring valuables to the hospital.            [facility] is not responsible for any belongings or valuables.  Do NOT Smoke (Tobacco/Vaping) or drink Alcohol 24 hours prior to your procedure If you use a CPAP at night, you may bring all equipment for your overnight stay.   Contacts, glasses, dentures or bridgework may not be worn into surgery, please bring cases for these belongings   For patients admitted to the hospital, discharge time will be determined by your treatment team.   Patients discharged the day of surgery will not be allowed to drive home, and someone needs to stay with them for 24 hours.    Special instructions:   [place]- Preparing For Surgery  Before surgery, you can play an important role. Because skin is not sterile, your skin needs to be as free of germs as possible. You can reduce the number of germs on your skin by washing with CHG (chlorahexidine gluconate) Soap before surgery.  CHG is  an antiseptic cleaner which kills germs and bonds with the skin to continue killing germs even after washing.    Oral Hygiene is also important to reduce your risk of infection.  Remember - BRUSH YOUR TEETH THE MORNING OF SURGERY WITH YOUR REGULAR TOOTHPASTE  Please do not use if you have an allergy to CHG or antibacterial soaps. If your skin becomes reddened/irritated stop using the CHG.  Do not shave (including legs and underarms) for at least 48 hours prior to first CHG shower. It is OK to shave your face.  Please follow these instructions carefully.   1. Shower the NIGHT BEFORE SURGERY and the MORNING OF SURGERY  2. If you chose to wash your hair, wash your hair first as usual with your normal shampoo.  3. After you shampoo, rinse your hair and body thoroughly to remove the shampoo.  4. Wash Face and genitals (private parts) with your normal soap.   5.  Shower the NIGHT BEFORE SURGERY and the MORNING OF SURGERY with CHG Soap.   6. Use CHG Soap as you would any other liquid soap. You can apply CHG directly to the skin and wash gently with a scrungie or a clean washcloth.  7. Apply the CHG Soap to your body ONLY FROM THE NECK DOWN.  Do not use on open wounds or open sores. Avoid contact with your eyes, ears, mouth and genitals (private parts). Wash Face and genitals (private parts)  with your normal soap.   8. Wash thoroughly, paying special attention to the area where your surgery will be performed.  9. Thoroughly rinse your body with warm water from the neck down.  10. DO NOT shower/wash with your normal soap after using and rinsing off the CHG Soap.  11. Pat yourself dry with a CLEAN TOWEL.  12. Wear CLEAN PAJAMAS to bed the night before surgery  13. Place CLEAN SHEETS on your bed the night before your surgery  14. DO NOT SLEEP WITH PETS.   Day of Surgery: Take a shower with CHG soap.  Wear Clean/Comfortable clothing the morning of surgery Do not apply any  deodorants/lotions.   Remember to brush your teeth WITH YOUR REGULAR TOOTHPASTE.   Please read over the following fact sheets that you were given.

## 2021-01-26 NOTE — Progress Notes (Signed)
  Echocardiogram 2D Echocardiogram has been performed.  Randa Lynn Osualdo Hansell 01/26/2021, 11:34 AM

## 2021-01-27 ENCOUNTER — Other Ambulatory Visit
Admission: RE | Admit: 2021-01-27 | Discharge: 2021-01-27 | Disposition: A | Discharge status: Home / Self Care | Payer: Medicare HMO | Source: Ambulatory Visit | Attending: Thoracic Surgery (Cardiothoracic Vascular Surgery) | Admitting: Thoracic Surgery (Cardiothoracic Vascular Surgery)

## 2021-01-27 ENCOUNTER — Other Ambulatory Visit: Payer: Self-pay

## 2021-01-27 ENCOUNTER — Encounter
Admission: RE | Admit: 2021-01-27 | Discharge: 2021-01-27 | Disposition: A | Discharge status: Home / Self Care | Payer: Medicare HMO | Source: Ambulatory Visit | Attending: Thoracic Surgery (Cardiothoracic Vascular Surgery) | Admitting: Thoracic Surgery (Cardiothoracic Vascular Surgery)

## 2021-01-27 ENCOUNTER — Ambulatory Visit (HOSPITAL_COMMUNITY)
Admission: RE | Admit: 2021-01-27 | Discharge: 2021-01-27 | Disposition: A | Discharge status: Home / Self Care | Payer: Medicare HMO | Source: Ambulatory Visit | Attending: Thoracic Surgery (Cardiothoracic Vascular Surgery) | Admitting: Thoracic Surgery (Cardiothoracic Vascular Surgery)

## 2021-01-27 ENCOUNTER — Encounter (HOSPITAL_COMMUNITY)
Admission: RE | Admit: 2021-01-27 | Discharge: 2021-01-27 | Disposition: A | Discharge status: Home / Self Care | Payer: Medicare HMO | Source: Ambulatory Visit | Attending: Thoracic Surgery (Cardiothoracic Vascular Surgery) | Admitting: Thoracic Surgery (Cardiothoracic Vascular Surgery)

## 2021-01-27 ENCOUNTER — Encounter (HOSPITAL_COMMUNITY): Payer: Self-pay

## 2021-01-27 DIAGNOSIS — J439 Emphysema, unspecified: Secondary | ICD-10-CM | POA: Insufficient documentation

## 2021-01-27 DIAGNOSIS — J449 Chronic obstructive pulmonary disease, unspecified: Secondary | ICD-10-CM | POA: Insufficient documentation

## 2021-01-27 DIAGNOSIS — Z7982 Long term (current) use of aspirin: Secondary | ICD-10-CM | POA: Insufficient documentation

## 2021-01-27 DIAGNOSIS — Z01818 Encounter for other preprocedural examination: Secondary | ICD-10-CM

## 2021-01-27 DIAGNOSIS — R911 Solitary pulmonary nodule: Secondary | ICD-10-CM | POA: Insufficient documentation

## 2021-01-27 DIAGNOSIS — Z20822 Contact with and (suspected) exposure to covid-19: Secondary | ICD-10-CM | POA: Diagnosis not present

## 2021-01-27 DIAGNOSIS — Z79899 Other long term (current) drug therapy: Secondary | ICD-10-CM | POA: Insufficient documentation

## 2021-01-27 DIAGNOSIS — I2510 Atherosclerotic heart disease of native coronary artery without angina pectoris: Secondary | ICD-10-CM | POA: Insufficient documentation

## 2021-01-27 DIAGNOSIS — E785 Hyperlipidemia, unspecified: Secondary | ICD-10-CM | POA: Diagnosis not present

## 2021-01-27 DIAGNOSIS — K219 Gastro-esophageal reflux disease without esophagitis: Secondary | ICD-10-CM | POA: Diagnosis not present

## 2021-01-27 DIAGNOSIS — I700 Atherosclerosis of aorta: Secondary | ICD-10-CM | POA: Diagnosis not present

## 2021-01-27 DIAGNOSIS — Z87891 Personal history of nicotine dependence: Secondary | ICD-10-CM | POA: Insufficient documentation

## 2021-01-27 DIAGNOSIS — I10 Essential (primary) hypertension: Secondary | ICD-10-CM | POA: Diagnosis not present

## 2021-01-27 DIAGNOSIS — Z01812 Encounter for preprocedural laboratory examination: Secondary | ICD-10-CM | POA: Insufficient documentation

## 2021-01-27 HISTORY — DX: Cardiac murmur, unspecified: R01.1

## 2021-01-27 LAB — COMPREHENSIVE METABOLIC PANEL
ALT: 25 U/L (ref 0–44)
AST: 27 U/L (ref 15–41)
Albumin: 3.6 g/dL (ref 3.5–5.0)
Alkaline Phosphatase: 39 U/L (ref 38–126)
Anion gap: 9 (ref 5–15)
BUN: 13 mg/dL (ref 8–23)
CO2: 23 mmol/L (ref 22–32)
Calcium: 9.4 mg/dL (ref 8.9–10.3)
Chloride: 106 mmol/L (ref 98–111)
Creatinine, Ser: 1.32 mg/dL — ABNORMAL HIGH (ref 0.61–1.24)
GFR, Estimated: 58 mL/min — ABNORMAL LOW (ref >60)
Glucose, Bld: 101 mg/dL — ABNORMAL HIGH (ref 70–99)
Potassium: 4 mmol/L (ref 3.5–5.1)
Sodium: 138 mmol/L (ref 135–145)
Total Bilirubin: 0.9 mg/dL (ref 0.3–1.2)
Total Protein: 6.2 g/dL — ABNORMAL LOW (ref 6.5–8.1)

## 2021-01-27 LAB — CBC
HCT: 46.2 % (ref 39.0–52.0)
Hemoglobin: 15.5 g/dL (ref 13.0–17.0)
MCH: 32.4 pg (ref 26.0–34.0)
MCHC: 33.5 g/dL (ref 30.0–36.0)
MCV: 96.5 fL (ref 80.0–100.0)
Platelets: 208 10*3/uL (ref 150–400)
RBC: 4.79 MIL/uL (ref 4.22–5.81)
RDW: 12.9 % (ref 11.5–15.5)
WBC: 7.5 10*3/uL (ref 4.0–10.5)
nRBC: 0 % (ref 0.0–0.2)

## 2021-01-27 LAB — URINALYSIS, ROUTINE W REFLEX MICROSCOPIC
Bilirubin Urine: NEGATIVE
Glucose, UA: NEGATIVE mg/dL
Hgb urine dipstick: NEGATIVE
Ketones, ur: NEGATIVE mg/dL
Leukocytes,Ua: NEGATIVE
Nitrite: NEGATIVE
Protein, ur: NEGATIVE mg/dL
Specific Gravity, Urine: 1.019 (ref 1.005–1.030)
pH: 5 (ref 5.0–8.0)

## 2021-01-27 LAB — BLOOD GAS, ARTERIAL
Acid-Base Excess: 1 mmol/L (ref 0.0–2.0)
Bicarbonate: 25 mmol/L (ref 20.0–28.0)
FIO2: 21
O2 Saturation: 96.8 %
Patient temperature: 37
pCO2 arterial: 39.8 mmHg (ref 32.0–48.0)
pH, Arterial: 7.415 (ref 7.350–7.450)
pO2, Arterial: 86.8 mmHg (ref 83.0–108.0)

## 2021-01-27 LAB — GLUCOSE, CAPILLARY: Glucose-Capillary: 111 mg/dL — ABNORMAL HIGH (ref 70–99)

## 2021-01-27 LAB — APTT: aPTT: 30 seconds (ref 24–36)

## 2021-01-27 LAB — PROTIME-INR
INR: 1 (ref 0.8–1.2)
Prothrombin Time: 13 seconds (ref 11.4–15.2)

## 2021-01-27 LAB — SURGICAL PCR SCREEN
MRSA, PCR: NEGATIVE
Staphylococcus aureus: NEGATIVE

## 2021-01-27 LAB — TYPE AND SCREEN
ABO/RH(D): A POS
Antibody Screen: NEGATIVE

## 2021-01-27 LAB — SARS CORONAVIRUS 2 (TAT 6-24 HRS): SARS Coronavirus 2: NEGATIVE

## 2021-01-27 MED ORDER — FLUDEOXYGLUCOSE F - 18 (FDG) INJECTION
13.3000 | Freq: Once | INTRAVENOUS | Status: AC | PRN
  Administered 2021-01-27: 14.17 via INTRAVENOUS

## 2021-01-27 NOTE — Progress Notes (Signed)
PCP - Holland Commons, FNP Cardiologist - Dr. Vernell Leep  PPM/ICD - denies Device Orders - denies Rep Notified - n/a  Chest x-ray - 01/27/2021 EKG - 01/27/2021 Stress Test - 11/04/2019 ECHO - 01/26/2021 Cardiac Cath - 11/14/2019  Sleep Study - 11/18/19. Test was negative but patient verbalized that he is using CPAP. CPAP - yes - 5  Blood Thinner Instructions: n/a Aspirin Instructions: patient was instructed to follow his surgeon's instructions on when to stop Aspirin.  If no instructions were given by your surgeon then patient will need to call the office to get those instructions. Patient was instructed to STOP taking any Aspirin (unless otherwise instructed by your surgeon) Aleve, Naproxen, Ibuprofen, Motrin, Advil, Goody's, BC's, all herbal medications, fish oil, and all vitamins.  ERAS Protcol - no   COVID TEST- 01/27/2021   Anesthesia review: yes; cardiac history; abnormal EKG  Patient denies shortness of breath, fever, cough and chest pain at PAT appointment   All instructions explained to the patient, with a verbal understanding of the material. Patient agrees to go over the instructions while at home for a better understanding. Patient also instructed to self quarantine after being tested for COVID-19. The opportunity to ask questions was provided.

## 2021-01-28 ENCOUNTER — Encounter (HOSPITAL_COMMUNITY): Payer: Self-pay

## 2021-01-28 NOTE — Anesthesia Preprocedure Evaluation (Addendum)
Anesthesia Evaluation  Patient identified by MRN, date of birth, ID band Patient awake    Reviewed: Allergy & Precautions, NPO status , Patient's Chart, lab work & pertinent test results  Airway Mallampati: II  TM Distance: >3 FB     Dental   Pulmonary shortness of breath, sleep apnea , COPD, Current Smoker,    breath sounds clear to auscultation       Cardiovascular hypertension, + angina + CAD  + Valvular Problems/Murmurs  Rhythm:Regular Rate:Normal     Neuro/Psych  Headaches,  Neuromuscular disease    GI/Hepatic Neg liver ROS, GERD  ,  Endo/Other    Renal/GU negative Renal ROS     Musculoskeletal   Abdominal   Peds  Hematology negative hematology ROS (+)   Anesthesia Other Findings   Reproductive/Obstetrics                           Anesthesia Physical Anesthesia Plan  ASA: III  Anesthesia Plan: General   Post-op Pain Management:    Induction: Intravenous  PONV Risk Score and Plan: 2 and Ondansetron and Dexamethasone  Airway Management Planned: Oral ETT  Additional Equipment: Arterial line  Intra-op Plan:   Post-operative Plan:   Informed Consent: I have reviewed the patients History and Physical, chart, labs and discussed the procedure including the risks, benefits and alternatives for the proposed anesthesia with the patient or authorized representative who has indicated his/her understanding and acceptance.     Dental advisory given  Plan Discussed with: CRNA, Anesthesiologist and Surgeon  Anesthesia Plan Comments: (PAT note written 01/28/2021 by Myra Gianotti, PA-C. )     Anesthesia Quick Evaluation

## 2021-01-28 NOTE — Progress Notes (Signed)
Anesthesia Chart Review:  Case: [id number] Date/Time: 01/31/21 0745   Procedure: XI ROBOTIC ASSISTED THORACOSCOPY-RIGHT LOWER LOBE SUPERIOR SEGMENTECTOMY (Right Chest)   Anesthesia type: General   Pre-op diagnosis: RLL LUNG NODULE   Location: [facility] OR ROOM 10 / [place] OR   Surgeons: Melrose Nakayama, MD      DISCUSSION: Patient is a 70 year old male scheduled for the above procedure.  He was found to have a groundglass opacity in the right lower lobe in late 2020.  Follow-up imaging showed increase in size. Mild FDG uptake on 01/27/21 PET scan. Above procedure planned.    History includes smoking, COPD, RLL lung nodule, exertional dyspnea, HTN, HLD, GERD/Hiatal hernia (s/p Nissen fundoplication with suture repair posterior hiatus 04/16/12), migraine, vertigo, CPAP use (despite no significant OSA on 11/18/19 sleep study), Mentor inflatable penile prosthesis (11/13/01). BMI is consistent with obesity.  Dr. Roxan Hockey classified Zubrod Score as 1 (Restricted in physical strenuous activity but ambulatory, able to do out light work). He ordered an echo to follow-up mild AS. 2021 LHC showed non-obstructive CAD.   Preoperative COVID-19 test negative on 01/27/2021.  Anesthesia team to evaluate on the day of surgery.   VS: BP 127/74   Pulse 85   Temp 36.7 C (Oral)   Resp 18   Ht 6' (1.829 m)   Wt 114 kg   SpO2 98%   BMI 34.08 kg/m    PROVIDERS: Holland Commons, FNP is PCP [facility]) Vernell Leep, MD is cardiologist. Last visit 11/24/19.    LABS: Labs reviewed: Acceptable for surgery. (all labs ordered are listed, but only abnormal results are displayed)  Labs Reviewed  COMPREHENSIVE METABOLIC PANEL - Abnormal; Notable for the following components:      Result Value   Glucose, Bld 101 (*)    Creatinine, Ser 1.32 (*)    Total Protein 6.2 (*)    GFR, Estimated 58 (*)    All other components within normal limits  BLOOD GAS, ARTERIAL - Abnormal; Notable for the  following components:   Allens test (pass/fail) BRACHIAL ARTERY (*)    All other components within normal limits  SURGICAL PCR SCREEN  CBC  PROTIME-INR  APTT  URINALYSIS, ROUTINE W REFLEX MICROSCOPIC  TYPE AND SCREEN    Sleep Study 11/18/19: IMPRESSION:  1. No clinically significant degree of OSA overall, the AHI was  under 5/h and would not be considered in need of treatment. There  was no significant hypoxia, no PLMs and no a cardiac arrhythmia.    IMAGES: PET Scan 01/27/21: IMPRESSION: 1. There is mild FDG uptake associated with the recently categorized enlarging part solid nodule. SUV max is equal to 1.7. This degree of uptake is considered nonspecific but may consistent with certain indolent neoplasms such as pulmonary adenocarcinoma. 2. No signs of FDG avid thoracic lymph nodes or FDG avid metastatic disease. 3. Aortic Atherosclerosis (ICD10-I70.0) and Emphysema (ICD10-J43.9). 4. Coronary artery calcifications.  CXR 01/27/21: IMPRESSION: Chronic lung changes without evidence of acute cardiopulmonary disease.  CT Chest lung cancer screen 12/30/20: IMPRESSION: 1. Enlarging right lower lobe ground-glass nodule with a new solid internal component measuring 8 mm. Lung-RADS 4B, suspicious. Additional imaging evaluation or consultation with Pulmonology or Thoracic Surgery recommended. These results will be called to the ordering clinician or representative by the Radiologist Assistant, and communication documented in the PACS or [employer]. 2. Aortic atherosclerosis (ICD10-I70.0). Coronary artery calcification. 3.  Emphysema (ICD10-J43.9).   EKG: 01/27/21: Normal sinus rhythm Nonspecific T wave  abnormality Abnormal ECG Confirmed by Dixie Dials [phone]) on 01/27/2021 1:58:55 PM   CV: Echo 01/26/21: IMPRESSIONS  1. Left ventricular ejection fraction, by estimation, is 60 to 65%. Left  ventricular ejection fraction by 3D volume is 67 %. The left ventricle has   normal function. The left ventricle has no regional wall motion  abnormalities. There is mild left  ventricular hypertrophy. Left ventricular diastolic parameters are  consistent with Grade I diastolic dysfunction (impaired relaxation).  2. Right ventricular systolic function is normal. The right ventricular  size is normal.  3. The mitral valve is normal in structure. No evidence of mitral valve  regurgitation. No evidence of mitral stenosis.  4. The aortic valve is tricuspid. There is mild calcification of the  aortic valve. Aortic valve regurgitation is not visualized. Very mild  aortic valve stenosis. Aortic valve area, by VTI measures 1.90 cm. Aortic  valve mean gradient measures 9.0 mmHg.  Aortic valve Vmax measures 2.01 m/s.  5. The inferior vena cava is normal in size with greater than 50%  respiratory variability, suggesting right atrial pressure of 3 mmHg.  - (Comparison 10/29/19: EF 17%, normal diastolic filling patter, mild AS with mean gradient 10 mmHg, Vmax of 2.1 m/s, calculated aortic valve area by continuity equation is 1.9 cm)   LHC 11/14/19: LM: No significant disease (Calcium sen in vessel wall, which correlates with finding on CT chest) LAD: Prox LAD/D2 bifurcation 50% disease. dFR 0.93 in mid LAD beyond the lesion LCx: OM1 with ectasia with 20% lesions proximal and distal to the ectatic part.  RCA: Prox and PDA 20% lesions  Normal LVEDP.  Impression: Nonobstructive coronary artery disease. Symptoms of chest pain and shortness of breath unlikely to be due to CAD.  Recommendation: Continue aggressive risk factor modification and medical management for CAD, including Aspirin and statin.    Lexiscan Tetrofosmin Stress Test  11/03/19: Nondiagnostic ECG stress. Mild diaphragmatic attenuation. No ischemia or scar.  Stress LV EF: 63%.  No previous exam available for comparison. Low risk study.    Past Medical History:  Diagnosis Date  . Chronic  migraine w/o aura, not intractable, w stat migr   . COPD (chronic obstructive pulmonary disease) ([place])   . Depression   . GERD (gastroesophageal reflux disease)   . Headache(784.0)    migraine  . Heart murmur    very mild AS 01/26/21 echo  . Hyperlipidemia   . Hypertension   . Shortness of breath    exertion  . Sleep apnea    stopbang=4  . Vertigo     Past Surgical History:  Procedure Laterality Date  . BRAVO [place] STUDY  11/29/2011   Procedure: BRAVO [place];  Surgeon: Landry Dyke, MD;  Location: [facility] ENDOSCOPY;  Service: Endoscopy;  Laterality: N/A;  . COLONOSCOPY  10/03/2012   Procedure: COLONOSCOPY;  Surgeon: Shann Medal, MD;  Location: [facility];  Service: General;  Laterality: N/A;  carol/leone  . HIATAL HERNIA REPAIR  04/16/2012   Procedure: LAPAROSCOPIC REPAIR OF HIATAL HERNIA;  Surgeon: Pedro Earls, MD;  Location: [facility] ORS;  Service: General;;  . INTRAVASCULAR PRESSURE WIRE/FFR STUDY N/A 11/14/2019   Procedure: INTRAVASCULAR PRESSURE WIRE/FFR STUDY;  Surgeon: Nigel Mormon, MD;  Location: [place] CV LAB;  Service: Cardiovascular;  Laterality: N/A;  . LAPAROSCOPIC NISSEN FUNDOPLICATION  04/14/736   Procedure: LAPAROSCOPIC NISSEN FUNDOPLICATION;  Surgeon: Pedro Earls, MD;  Location: [facility] ORS;  Service: General;  Laterality: N/A;  . LEFT HEART  CATH AND CORONARY ANGIOGRAPHY N/A 11/14/2019   Procedure: LEFT HEART CATH AND CORONARY ANGIOGRAPHY;  Surgeon: Nigel Mormon, MD;  Location: [place] CV LAB;  Service: Cardiovascular;  Laterality: N/A;  . none      MEDICATIONS: . aspirin EC 81 MG tablet  . atorvastatin (LIPITOR) 40 MG tablet  . Cholecalciferol (VITAMIN D3) 125 MCG (5000 UT) CAPS  . Erenumab-aooe (AIMOVIG) 140 MG/ML SOAJ  . FLUoxetine (PROZAC) 40 MG capsule  . gabapentin (NEURONTIN) 300 MG capsule  . ketoconazole (NIZORAL) 2 % shampoo  . LINZESS 290 MCG CAPS capsule  . MELATONIN PO  . omeprazole (PRILOSEC) 20 MG capsule  . rizatriptan  (MAXALT-MLT) 10 MG disintegrating tablet  . traZODone (DESYREL) 50 MG tablet  . valsartan-hydrochlorothiazide (DIOVAN-HCT) 160-12.5 MG tablet   No current facility-administered medications for this encounter.    Myra Gianotti, PA-C Surgical Short Stay/Anesthesiology [facility] Phone [phone] [facility] Phone [phone] 01/28/2021 9:50 AM

## 2021-01-31 ENCOUNTER — Inpatient Hospital Stay (HOSPITAL_COMMUNITY): Payer: Medicare HMO | Admitting: Anesthesiology

## 2021-01-31 ENCOUNTER — Encounter (HOSPITAL_COMMUNITY): Payer: Self-pay | Admitting: Thoracic Surgery (Cardiothoracic Vascular Surgery)

## 2021-01-31 ENCOUNTER — Other Ambulatory Visit: Payer: Self-pay

## 2021-01-31 ENCOUNTER — Inpatient Hospital Stay (HOSPITAL_COMMUNITY): Payer: Medicare HMO | Admitting: Vascular Surgery

## 2021-01-31 ENCOUNTER — Encounter (HOSPITAL_COMMUNITY)
Admission: RE | Disposition: A | Discharge status: Home / Self Care | Payer: Self-pay | Source: Home / Self Care | Attending: Thoracic Surgery (Cardiothoracic Vascular Surgery)

## 2021-01-31 ENCOUNTER — Inpatient Hospital Stay (HOSPITAL_COMMUNITY)
Admission: RE | Admit: 2021-01-31 | Discharge: 2021-02-03 | DRG: 164 | Disposition: A | Discharge status: Home / Self Care | Payer: Medicare HMO | Attending: Thoracic Surgery (Cardiothoracic Vascular Surgery) | Admitting: Thoracic Surgery (Cardiothoracic Vascular Surgery)

## 2021-01-31 ENCOUNTER — Inpatient Hospital Stay (HOSPITAL_COMMUNITY): Payer: Medicare HMO

## 2021-01-31 DIAGNOSIS — R911 Solitary pulmonary nodule: Secondary | ICD-10-CM | POA: Diagnosis present

## 2021-01-31 DIAGNOSIS — Z902 Acquired absence of lung [part of]: Secondary | ICD-10-CM

## 2021-01-31 DIAGNOSIS — I10 Essential (primary) hypertension: Secondary | ICD-10-CM | POA: Diagnosis present

## 2021-01-31 DIAGNOSIS — K219 Gastro-esophageal reflux disease without esophagitis: Secondary | ICD-10-CM | POA: Diagnosis present

## 2021-01-31 DIAGNOSIS — Z79899 Other long term (current) drug therapy: Secondary | ICD-10-CM | POA: Diagnosis not present

## 2021-01-31 DIAGNOSIS — G43909 Migraine, unspecified, not intractable, without status migrainosus: Secondary | ICD-10-CM | POA: Diagnosis present

## 2021-01-31 DIAGNOSIS — J9811 Atelectasis: Secondary | ICD-10-CM | POA: Diagnosis present

## 2021-01-31 DIAGNOSIS — J438 Other emphysema: Secondary | ICD-10-CM | POA: Diagnosis present

## 2021-01-31 DIAGNOSIS — C3431 Malignant neoplasm of lower lobe, right bronchus or lung: Principal | ICD-10-CM | POA: Diagnosis present

## 2021-01-31 DIAGNOSIS — Z87891 Personal history of nicotine dependence: Secondary | ICD-10-CM | POA: Diagnosis not present

## 2021-01-31 DIAGNOSIS — E785 Hyperlipidemia, unspecified: Secondary | ICD-10-CM | POA: Diagnosis present

## 2021-01-31 DIAGNOSIS — J9382 Other air leak: Secondary | ICD-10-CM | POA: Diagnosis not present

## 2021-01-31 DIAGNOSIS — G4733 Obstructive sleep apnea (adult) (pediatric): Secondary | ICD-10-CM | POA: Diagnosis present

## 2021-01-31 DIAGNOSIS — Z7982 Long term (current) use of aspirin: Secondary | ICD-10-CM

## 2021-01-31 DIAGNOSIS — I2510 Atherosclerotic heart disease of native coronary artery without angina pectoris: Secondary | ICD-10-CM | POA: Diagnosis present

## 2021-01-31 DIAGNOSIS — J432 Centrilobular emphysema: Secondary | ICD-10-CM | POA: Diagnosis present

## 2021-01-31 DIAGNOSIS — R0602 Shortness of breath: Secondary | ICD-10-CM

## 2021-01-31 DIAGNOSIS — Z4682 Encounter for fitting and adjustment of non-vascular catheter: Secondary | ICD-10-CM

## 2021-01-31 DIAGNOSIS — Z9889 Other specified postprocedural states: Secondary | ICD-10-CM

## 2021-01-31 HISTORY — PX: XI ROBOTIC ASSISTED THORACOSCOPY- SEGMENTECTOMY: SHX6881

## 2021-01-31 HISTORY — PX: INTERCOSTAL NERVE BLOCK: SHX5021

## 2021-01-31 HISTORY — PX: NODE DISSECTION: SHX5269

## 2021-01-31 LAB — ABO/RH: ABO/RH(D): A POS

## 2021-01-31 SURGERY — RESECTION, LUNG, SEGMENTAL, ROBOT-ASSISTED
Anesthesia: General | Site: Chest | Laterality: Right

## 2021-01-31 MED ORDER — BUPIVACAINE LIPOSOME 1.3 % IJ SUSP
INTRAMUSCULAR | Status: AC
  Filled 2021-01-31: qty 20

## 2021-01-31 MED ORDER — LIDOCAINE 2% (20 MG/ML) 5 ML SYRINGE
INTRAMUSCULAR | Status: DC | PRN
  Administered 2021-01-31: 60 mg via INTRAVENOUS

## 2021-01-31 MED ORDER — SUGAMMADEX SODIUM 200 MG/2ML IV SOLN
INTRAVENOUS | Status: DC | PRN
  Administered 2021-01-31 (×2): 100 mg via INTRAVENOUS

## 2021-01-31 MED ORDER — ATORVASTATIN CALCIUM 40 MG PO TABS
40.0000 mg | ORAL_TABLET | Freq: Every day | ORAL | Status: DC
  Administered 2021-01-31 – 2021-02-02 (×3): 40 mg via ORAL
  Filled 2021-01-31 (×3): qty 1

## 2021-01-31 MED ORDER — METOCLOPRAMIDE HCL 5 MG/ML IJ SOLN
10.0000 mg | Freq: Four times a day (QID) | INTRAMUSCULAR | Status: AC
  Administered 2021-01-31 – 2021-02-01 (×3): 10 mg via INTRAVENOUS
  Filled 2021-01-31 (×3): qty 2

## 2021-01-31 MED ORDER — HEMOSTATIC AGENTS (NO CHARGE) OPTIME
TOPICAL | Status: DC | PRN
  Administered 2021-01-31: 1 via TOPICAL

## 2021-01-31 MED ORDER — ACETAMINOPHEN 10 MG/ML IV SOLN
INTRAVENOUS | Status: AC
  Filled 2021-01-31: qty 100

## 2021-01-31 MED ORDER — ONDANSETRON HCL 4 MG/2ML IJ SOLN
INTRAMUSCULAR | Status: AC
  Filled 2021-01-31: qty 2

## 2021-01-31 MED ORDER — ONDANSETRON HCL 4 MG/2ML IJ SOLN
INTRAMUSCULAR | Status: DC | PRN
  Administered 2021-01-31: 4 mg via INTRAVENOUS

## 2021-01-31 MED ORDER — ROCURONIUM BROMIDE 10 MG/ML (PF) SYRINGE
PREFILLED_SYRINGE | INTRAVENOUS | Status: DC | PRN
  Administered 2021-01-31: 60 mg via INTRAVENOUS
  Administered 2021-01-31: 40 mg via INTRAVENOUS

## 2021-01-31 MED ORDER — INDOCYANINE GREEN 25 MG IV SOLR
INTRAVENOUS | Status: DC | PRN
  Administered 2021-01-31: 25 mg via INTRAVENOUS

## 2021-01-31 MED ORDER — PANTOPRAZOLE SODIUM 40 MG PO TBEC
40.0000 mg | DELAYED_RELEASE_TABLET | Freq: Every day | ORAL | Status: DC
  Administered 2021-01-31 – 2021-02-03 (×4): 40 mg via ORAL
  Filled 2021-01-31 (×4): qty 1

## 2021-01-31 MED ORDER — ORAL CARE MOUTH RINSE: 15.0000 mL | Freq: Once | OROMUCOSAL | Status: AC

## 2021-01-31 MED ORDER — SODIUM CHLORIDE 0.9 % IV SOLN: INTRAVENOUS | Status: DC

## 2021-01-31 MED ORDER — ACETAMINOPHEN 500 MG PO TABS
1000.0000 mg | ORAL_TABLET | Freq: Four times a day (QID) | ORAL | Status: DC
  Administered 2021-01-31 – 2021-02-03 (×11): 1000 mg via ORAL
  Filled 2021-01-31 (×11): qty 2

## 2021-01-31 MED ORDER — HYDROCHLOROTHIAZIDE 10 MG/ML ORAL SUSPENSION
6.2500 mg | Freq: Every day | ORAL | Status: DC
  Administered 2021-01-31 – 2021-02-03 (×3): 6.25 mg via ORAL
  Filled 2021-01-31 (×4): qty 1.25

## 2021-01-31 MED ORDER — DEXAMETHASONE SODIUM PHOSPHATE 10 MG/ML IJ SOLN
INTRAMUSCULAR | Status: DC | PRN
  Administered 2021-01-31: 10 mg via INTRAVENOUS

## 2021-01-31 MED ORDER — TRAZODONE HCL 50 MG PO TABS: 50.0000 mg | ORAL_TABLET | Freq: Every evening | ORAL | Status: DC | PRN

## 2021-01-31 MED ORDER — CEFAZOLIN SODIUM-DEXTROSE 2-4 GM/100ML-% IV SOLN
2.0000 g | INTRAVENOUS | Status: AC
  Administered 2021-01-31: 2 g via INTRAVENOUS
  Filled 2021-01-31: qty 100

## 2021-01-31 MED ORDER — PHENYLEPHRINE 40 MCG/ML (10ML) SYRINGE FOR IV PUSH (FOR BLOOD PRESSURE SUPPORT)
PREFILLED_SYRINGE | INTRAVENOUS | Status: AC
  Filled 2021-01-31: qty 10

## 2021-01-31 MED ORDER — BUPIVACAINE HCL (PF) 0.5 % IJ SOLN
INTRAMUSCULAR | Status: AC
  Filled 2021-01-31: qty 30

## 2021-01-31 MED ORDER — PROPOFOL 10 MG/ML IV BOLUS
INTRAVENOUS | Status: AC
  Filled 2021-01-31: qty 20

## 2021-01-31 MED ORDER — MELATONIN 1 MG PO TABS: 1.0000 mg | ORAL_TABLET | Freq: Every day | ORAL | Status: DC

## 2021-01-31 MED ORDER — ENOXAPARIN SODIUM 40 MG/0.4ML ~~LOC~~ SOLN
40.0000 mg | SUBCUTANEOUS | Status: DC
  Administered 2021-02-01 – 2021-02-02 (×3): 40 mg via SUBCUTANEOUS
  Filled 2021-01-31 (×3): qty 0.4

## 2021-01-31 MED ORDER — HYDROMORPHONE HCL 1 MG/ML IJ SOLN
0.2500 mg | INTRAMUSCULAR | Status: DC | PRN
  Administered 2021-01-31 (×5): 0.25 mg via INTRAVENOUS

## 2021-01-31 MED ORDER — MIDAZOLAM HCL 5 MG/5ML IJ SOLN
INTRAMUSCULAR | Status: DC | PRN
  Administered 2021-01-31 (×2): 1 mg via INTRAVENOUS

## 2021-01-31 MED ORDER — FENTANYL CITRATE (PF) 250 MCG/5ML IJ SOLN
INTRAMUSCULAR | Status: DC | PRN
  Administered 2021-01-31 (×3): 50 ug via INTRAVENOUS
  Administered 2021-01-31: 200 ug via INTRAVENOUS

## 2021-01-31 MED ORDER — HYDROMORPHONE HCL 1 MG/ML IJ SOLN
INTRAMUSCULAR | Status: AC
  Filled 2021-01-31: qty 1

## 2021-01-31 MED ORDER — OXYCODONE HCL 5 MG PO TABS
5.0000 mg | ORAL_TABLET | ORAL | Status: DC | PRN
  Administered 2021-01-31 – 2021-02-01 (×6): 10 mg via ORAL
  Filled 2021-01-31 (×5): qty 2

## 2021-01-31 MED ORDER — FENTANYL CITRATE (PF) 250 MCG/5ML IJ SOLN
INTRAMUSCULAR | Status: AC
  Filled 2021-01-31: qty 5

## 2021-01-31 MED ORDER — PHENYLEPHRINE 40 MCG/ML (10ML) SYRINGE FOR IV PUSH (FOR BLOOD PRESSURE SUPPORT)
PREFILLED_SYRINGE | INTRAVENOUS | Status: DC | PRN
  Administered 2021-01-31: 80 ug via INTRAVENOUS

## 2021-01-31 MED ORDER — CHLORHEXIDINE GLUCONATE CLOTH 2 % EX PADS
6.0000 | MEDICATED_PAD | Freq: Every day | CUTANEOUS | Status: DC
  Administered 2021-01-31 – 2021-02-02 (×3): 6 via TOPICAL

## 2021-01-31 MED ORDER — FLUOXETINE HCL 20 MG PO CAPS
40.0000 mg | ORAL_CAPSULE | Freq: Every morning | ORAL | Status: DC
  Administered 2021-02-01 – 2021-02-03 (×3): 40 mg via ORAL
  Filled 2021-01-31 (×4): qty 2

## 2021-01-31 MED ORDER — BISACODYL 5 MG PO TBEC
10.0000 mg | DELAYED_RELEASE_TABLET | Freq: Every day | ORAL | Status: DC
  Administered 2021-02-02 – 2021-02-03 (×2): 10 mg via ORAL
  Filled 2021-01-31 (×3): qty 2

## 2021-01-31 MED ORDER — 0.9 % SODIUM CHLORIDE (POUR BTL) OPTIME
TOPICAL | Status: DC | PRN
  Administered 2021-01-31 (×2): 1000 mL

## 2021-01-31 MED ORDER — OXYCODONE HCL 5 MG PO TABS
ORAL_TABLET | ORAL | Status: AC
  Filled 2021-01-31: qty 2

## 2021-01-31 MED ORDER — CEFAZOLIN SODIUM-DEXTROSE 2-4 GM/100ML-% IV SOLN
2.0000 g | Freq: Three times a day (TID) | INTRAVENOUS | Status: AC
  Administered 2021-01-31 – 2021-02-01 (×2): 2 g via INTRAVENOUS
  Filled 2021-01-31 (×2): qty 100

## 2021-01-31 MED ORDER — SENNOSIDES-DOCUSATE SODIUM 8.6-50 MG PO TABS
1.0000 | ORAL_TABLET | Freq: Every day | ORAL | Status: DC
  Administered 2021-02-01 – 2021-02-02 (×2): 1 via ORAL
  Filled 2021-01-31 (×2): qty 1

## 2021-01-31 MED ORDER — VALSARTAN-HYDROCHLOROTHIAZIDE 160-12.5 MG PO TABS: 0.5000 | ORAL_TABLET | Freq: Every day | ORAL | Status: DC

## 2021-01-31 MED ORDER — IRBESARTAN 150 MG PO TABS
75.0000 mg | ORAL_TABLET | Freq: Every day | ORAL | Status: DC
  Administered 2021-01-31 – 2021-02-03 (×3): 75 mg via ORAL
  Filled 2021-01-31 (×4): qty 1

## 2021-01-31 MED ORDER — LINACLOTIDE 145 MCG PO CAPS
290.0000 ug | ORAL_CAPSULE | Freq: Every day | ORAL | Status: DC | PRN
  Administered 2021-02-02: 290 ug via ORAL
  Filled 2021-01-31 (×3): qty 2

## 2021-01-31 MED ORDER — ACETAMINOPHEN 10 MG/ML IV SOLN
1000.0000 mg | Freq: Once | INTRAVENOUS | Status: AC
  Administered 2021-01-31: 1000 mg via INTRAVENOUS
  Filled 2021-01-31: qty 100

## 2021-01-31 MED ORDER — ASPIRIN EC 81 MG PO TBEC
81.0000 mg | DELAYED_RELEASE_TABLET | Freq: Every day | ORAL | Status: DC
  Administered 2021-01-31 – 2021-02-02 (×3): 81 mg via ORAL
  Filled 2021-01-31 (×3): qty 1

## 2021-01-31 MED ORDER — LACTATED RINGERS IV SOLN: INTRAVENOUS | Status: DC

## 2021-01-31 MED ORDER — MELATONIN 3 MG PO TABS
3.0000 mg | ORAL_TABLET | Freq: Every day | ORAL | Status: DC
  Administered 2021-01-31 – 2021-02-02 (×3): 3 mg via ORAL
  Filled 2021-01-31 (×3): qty 1

## 2021-01-31 MED ORDER — ONDANSETRON HCL 4 MG/2ML IJ SOLN: 4.0000 mg | Freq: Four times a day (QID) | INTRAMUSCULAR | Status: DC | PRN

## 2021-01-31 MED ORDER — TRAMADOL HCL 50 MG PO TABS
50.0000 mg | ORAL_TABLET | Freq: Four times a day (QID) | ORAL | Status: DC | PRN
  Administered 2021-02-01: 50 mg via ORAL
  Filled 2021-01-31: qty 2
  Filled 2021-01-31: qty 1
  Filled 2021-01-31: qty 2

## 2021-01-31 MED ORDER — MIDAZOLAM HCL 2 MG/2ML IJ SOLN
INTRAMUSCULAR | Status: AC
  Filled 2021-01-31: qty 2

## 2021-01-31 MED ORDER — HYDROMORPHONE HCL 1 MG/ML IJ SOLN
0.2500 mg | INTRAMUSCULAR | Status: DC | PRN
Start: 2021-01-31 — End: 2021-01-31
  Administered 2021-01-31: 0.5 mg via INTRAVENOUS
  Administered 2021-01-31: 0.25 mg via INTRAVENOUS

## 2021-01-31 MED ORDER — ROCURONIUM BROMIDE 10 MG/ML (PF) SYRINGE
PREFILLED_SYRINGE | INTRAVENOUS | Status: AC
  Filled 2021-01-31: qty 10

## 2021-01-31 MED ORDER — DEXAMETHASONE SODIUM PHOSPHATE 10 MG/ML IJ SOLN
INTRAMUSCULAR | Status: AC
  Filled 2021-01-31: qty 1

## 2021-01-31 MED ORDER — LIDOCAINE 2% (20 MG/ML) 5 ML SYRINGE
INTRAMUSCULAR | Status: AC
  Filled 2021-01-31: qty 5

## 2021-01-31 MED ORDER — LACTATED RINGERS IV SOLN: INTRAVENOUS | Status: DC | PRN

## 2021-01-31 MED ORDER — ACETAMINOPHEN 160 MG/5ML PO SOLN: 1000.0000 mg | Freq: Four times a day (QID) | ORAL | Status: DC

## 2021-01-31 MED ORDER — SODIUM CHLORIDE FLUSH 0.9 % IV SOLN
INTRAVENOUS | Status: DC | PRN
  Administered 2021-01-31: 95 mL

## 2021-01-31 MED ORDER — GABAPENTIN 300 MG PO CAPS
300.0000 mg | ORAL_CAPSULE | Freq: Every day | ORAL | Status: DC
  Administered 2021-01-31 – 2021-02-02 (×3): 300 mg via ORAL
  Filled 2021-01-31 (×3): qty 1

## 2021-01-31 MED ORDER — PROPOFOL 10 MG/ML IV BOLUS
INTRAVENOUS | Status: DC | PRN
  Administered 2021-01-31: 200 mg via INTRAVENOUS

## 2021-01-31 MED ORDER — SODIUM CHLORIDE 0.9 % IR SOLN
Status: DC | PRN
  Administered 2021-01-31 (×2): 1000 mL

## 2021-01-31 MED ORDER — CHLORHEXIDINE GLUCONATE 0.12 % MT SOLN
15.0000 mL | Freq: Once | OROMUCOSAL | Status: AC
  Administered 2021-01-31: 15 mL via OROMUCOSAL
  Filled 2021-01-31: qty 15

## 2021-01-31 SURGICAL SUPPLY — 124 items
ADH SKN CLS APL DERMABOND .7 (GAUZE/BANDAGES/DRESSINGS) ×1
APPLIER CLIP ROT 10 11.4 M/L (STAPLE)
APR CLP MED LRG 11.4X10 (STAPLE)
BAG SPEC RTRVL C125 8X14 (MISCELLANEOUS) ×1
BAG TISS RTRVL C300 12X14 (MISCELLANEOUS) ×1
BLADE CLIPPER SURG (BLADE) IMPLANT
BLADE SURG SZ11 CARB STEEL (BLADE) ×2 IMPLANT
BNDG COHESIVE 6X5 TAN STRL LF (GAUZE/BANDAGES/DRESSINGS) ×2 IMPLANT
CANISTER SUCT 3000ML PPV (MISCELLANEOUS) ×4 IMPLANT
CANNULA REDUC XI 12-8 STAPL (CANNULA) ×4
CANNULA REDUCER 12-8 DVNC XI (CANNULA) ×2 IMPLANT
CATH THORACIC 28FR (CATHETERS) IMPLANT
CATH THORACIC 28FR RT ANG (CATHETERS) IMPLANT
CATH THORACIC 36FR (CATHETERS) IMPLANT
CATH THORACIC 36FR RT ANG (CATHETERS) IMPLANT
CLIP APPLIE ROT 10 11.4 M/L (STAPLE) IMPLANT
CLIP VESOCCLUDE MED 6/CT (CLIP) IMPLANT
CNTNR URN SCR LID CUP LEK RST (MISCELLANEOUS) ×5 IMPLANT
CONN ST 1/4X3/8  BEN (MISCELLANEOUS)
CONN ST 1/4X3/8 BEN (MISCELLANEOUS) IMPLANT
CONN Y 3/8X3/8X3/8  BEN (MISCELLANEOUS)
CONN Y 3/8X3/8X3/8 BEN (MISCELLANEOUS) IMPLANT
CONT SPEC 4OZ STRL OR WHT (MISCELLANEOUS) ×34
DEFOGGER SCOPE WARMER CLEARIFY (MISCELLANEOUS) ×2 IMPLANT
DERMABOND ADVANCED (GAUZE/BANDAGES/DRESSINGS) ×1
DERMABOND ADVANCED .7 DNX12 (GAUZE/BANDAGES/DRESSINGS) ×1 IMPLANT
DRAIN CHANNEL 28F RND 3/8 FF (WOUND CARE) ×2 IMPLANT
DRAIN CHANNEL 32F RND 10.7 FF (WOUND CARE) IMPLANT
DRAPE ARM DVNC X/XI (DISPOSABLE) ×4 IMPLANT
DRAPE COLUMN DVNC XI (DISPOSABLE) ×1 IMPLANT
DRAPE CV SPLIT W-CLR ANES SCRN (DRAPES) ×2 IMPLANT
DRAPE DA VINCI XI ARM (DISPOSABLE) ×8
DRAPE DA VINCI XI COLUMN (DISPOSABLE) ×2
DRAPE INCISE IOBAN 66X45 STRL (DRAPES) IMPLANT
DRAPE ORTHO SPLIT 77X108 STRL (DRAPES) ×2
DRAPE SURG ORHT 6 SPLT 77X108 (DRAPES) ×1 IMPLANT
DRAPE WARM FLUID 44X44 (DRAPES) IMPLANT
ELECT BLADE 6.5 EXT (BLADE) IMPLANT
ELECT REM PT RETURN 9FT ADLT (ELECTROSURGICAL) ×2
ELECTRODE REM PT RTRN 9FT ADLT (ELECTROSURGICAL) ×1 IMPLANT
GAUZE KITTNER 4X5 RF (MISCELLANEOUS) ×4 IMPLANT
GAUZE SPONGE 4X4 12PLY STRL (GAUZE/BANDAGES/DRESSINGS) ×2 IMPLANT
GLOVE TRIUMPH SURG SIZE 7.5 (KITS) ×4 IMPLANT
GOWN STRL REUS W/ TWL LRG LVL3 (GOWN DISPOSABLE) ×2 IMPLANT
GOWN STRL REUS W/ TWL XL LVL3 (GOWN DISPOSABLE) ×3 IMPLANT
GOWN STRL REUS W/TWL 2XL LVL3 (GOWN DISPOSABLE) ×4 IMPLANT
GOWN STRL REUS W/TWL LRG LVL3 (GOWN DISPOSABLE) ×4
GOWN STRL REUS W/TWL XL LVL3 (GOWN DISPOSABLE) ×6
HEMOSTAT SURGICEL 2X14 (HEMOSTASIS) ×8 IMPLANT
IRRIGATION STRYKERFLOW (MISCELLANEOUS) ×1 IMPLANT
IRRIGATOR STRYKERFLOW (MISCELLANEOUS) ×2
KIT BASIN OR (CUSTOM PROCEDURE TRAY) ×2 IMPLANT
KIT SUCTION CATH 14FR (SUCTIONS) IMPLANT
KIT TURNOVER KIT B (KITS) ×2 IMPLANT
LOOP VESSEL SUPERMAXI WHITE (MISCELLANEOUS) IMPLANT
NDL HYPO 25GX1X1/2 BEV (NEEDLE) ×1 IMPLANT
NEEDLE HYPO 25GX1X1/2 BEV (NEEDLE) ×2 IMPLANT
NEEDLE SPNL 22GX3.5 QUINCKE BK (NEEDLE) IMPLANT
NS IRRIG 1000ML POUR BTL (IV SOLUTION) ×2 IMPLANT
PACK CHEST (CUSTOM PROCEDURE TRAY) ×2 IMPLANT
PAD ARMBOARD 7.5X6 YLW CONV (MISCELLANEOUS) ×4 IMPLANT
PORT ACCESS TROCAR AIRSEAL 12 (TROCAR) ×1 IMPLANT
PORT ACCESS TROCAR AIRSEAL 5M (TROCAR) ×1
RELOAD STAPLE 45 2.5 WHT DVNC (STAPLE) IMPLANT
RELOAD STAPLE 45 4.3 GRN DVNC (STAPLE) IMPLANT
RELOAD STAPLER 2.5X45 WHT DVNC (STAPLE) ×1 IMPLANT
RELOAD STAPLER 3.5X45 BLU DVNC (STAPLE) ×5 IMPLANT
RELOAD STAPLER 4.3X45 GRN DVNC (STAPLE) ×4 IMPLANT
RELOAD STAPLER 45 4.6 BLK DVNC (STAPLE) ×2 IMPLANT
SCISSORS LAP 5X35 DISP (ENDOMECHANICALS) IMPLANT
SEAL CANN UNIV 5-8 DVNC XI (MISCELLANEOUS) ×2 IMPLANT
SEAL XI 5MM-8MM UNIVERSAL (MISCELLANEOUS) ×4
SEALANT PROGEL (MISCELLANEOUS) IMPLANT
SEALANT SURG COSEAL 4ML (VASCULAR PRODUCTS) IMPLANT
SEALANT SURG COSEAL 8ML (VASCULAR PRODUCTS) IMPLANT
SET TRI-LUMEN FLTR TB AIRSEAL (TUBING) ×2 IMPLANT
SHEARS HARMONIC HDI 20CM (ELECTROSURGICAL) IMPLANT
SHEET MEDIUM DRAPE 40X70 STRL (DRAPES) ×2 IMPLANT
SOLUTION ELECTROLUBE (MISCELLANEOUS) IMPLANT
SPECIMEN JAR MEDIUM (MISCELLANEOUS) IMPLANT
SPONGE INTESTINAL PEANUT (DISPOSABLE) IMPLANT
SPONGE TONSIL TAPE 1 RFD (DISPOSABLE) IMPLANT
STAPLER 45 SUREFORM CVD (STAPLE) ×2
STAPLER 45 SUREFORM CVD DVNC (STAPLE) IMPLANT
STAPLER CANNULA SEAL DVNC XI (STAPLE) ×2 IMPLANT
STAPLER CANNULA SEAL XI (STAPLE) ×4
STAPLER RELOAD 2.5X45 WHITE (STAPLE) ×2
STAPLER RELOAD 2.5X45 WHT DVNC (STAPLE) ×1
STAPLER RELOAD 3.5X45 BLU DVNC (STAPLE) ×5
STAPLER RELOAD 3.5X45 BLUE (STAPLE) ×10
STAPLER RELOAD 4.3X45 GREEN (STAPLE) ×8
STAPLER RELOAD 4.3X45 GRN DVNC (STAPLE) ×4
STAPLER RELOAD 45 4.6 BLK (STAPLE) ×4
STAPLER RELOAD 45 4.6 BLK DVNC (STAPLE) ×2
SUT PDS AB 3-0 SH 27 (SUTURE) IMPLANT
SUT PROLENE 4 0 RB 1 (SUTURE)
SUT PROLENE 4-0 RB1 .5 CRCL 36 (SUTURE) IMPLANT
SUT SILK  1 MH (SUTURE) ×4
SUT SILK 1 MH (SUTURE) ×2 IMPLANT
SUT SILK 1 TIES 10X30 (SUTURE) ×1 IMPLANT
SUT SILK 2 0 SH (SUTURE) ×1 IMPLANT
SUT SILK 2 0SH CR/8 30 (SUTURE) IMPLANT
SUT SILK 3 0 SH 30 (SUTURE) IMPLANT
SUT SILK 3 0SH CR/8 30 (SUTURE) ×1 IMPLANT
SUT VIC AB 1 CTX 36 (SUTURE)
SUT VIC AB 1 CTX36XBRD ANBCTR (SUTURE) IMPLANT
SUT VIC AB 2-0 CTX 36 (SUTURE) ×2 IMPLANT
SUT VIC AB 3-0 MH 27 (SUTURE) IMPLANT
SUT VIC AB 3-0 X1 27 (SUTURE) ×2 IMPLANT
SUT VICRYL 0 TIES 12 18 (SUTURE) ×2 IMPLANT
SUT VICRYL 0 UR6 27IN ABS (SUTURE) ×4 IMPLANT
SUT VICRYL 2 TP 1 (SUTURE) IMPLANT
SYR 20ML ECCENTRIC (SYRINGE) ×2 IMPLANT
SYR 30ML LL (SYRINGE) ×2 IMPLANT
SYSTEM RETRIEVAL ANCHOR 12 (MISCELLANEOUS) ×1 IMPLANT
SYSTEM RETRIEVAL ANCHOR 8 (MISCELLANEOUS) ×2 IMPLANT
SYSTEM SAHARA CHEST DRAIN ATS (WOUND CARE) ×2 IMPLANT
TAPE CLOTH 4X10 WHT NS (GAUZE/BANDAGES/DRESSINGS) ×2 IMPLANT
TAPE CLOTH SURG 4X10 WHT LF (GAUZE/BANDAGES/DRESSINGS) ×1 IMPLANT
TIP APPLICATOR SPRAY EXTEND 16 (VASCULAR PRODUCTS) IMPLANT
TOWEL GREEN STERILE (TOWEL DISPOSABLE) ×2 IMPLANT
TRAY FOLEY MTR SLVR 16FR STAT (SET/KITS/TRAYS/PACK) ×2 IMPLANT
TROCAR BLADELESS 15MM (ENDOMECHANICALS) IMPLANT
WATER STERILE IRR 1000ML POUR (IV SOLUTION) ×2 IMPLANT

## 2021-01-31 NOTE — Addendum Note (Signed)
Addendum  created 01/31/21 1326 by Oleta Mouse, MD   Order list changed, Pharmacy for encounter modified

## 2021-01-31 NOTE — Interval H&P Note (Signed)
History and Physical Interval Note:  Echo- mild AS PFTs OK PET- no suspicious findings for metastatic disease 01/31/2021 7:12 AM  Mitchell Baxter  has presented today for surgery, with the diagnosis of RLL LUNG NODULE.  The various methods of treatment have been discussed with the patient and family. After consideration of risks, benefits and other options for treatment, the patient has consented to  Procedure(s): XI ROBOTIC ASSISTED THORACOSCOPY-RIGHT LOWER LOBE SUPERIOR SEGMENTECTOMY (Right) as a surgical intervention.  The patient's history has been reviewed, patient examined, no change in status, stable for surgery.  I have reviewed the patient's chart and labs.  Questions were answered to the patient's satisfaction.     Melrose Nakayama

## 2021-01-31 NOTE — Hospital Course (Addendum)
History of Present Illness:  Mitchell Baxter is sent for consultation regarding a suspicious right lower lobe lung nodule.   Mitchell Baxter is a 70 year old man with a history of tobacco abuse, COPD, hypertension, hyperlipidemia, severe migraines, sleep apnea, vertigo, reflux with previous hiatal hernia repair, and depression.  He has smoked about 1 pack of cigarettes daily for 40 years.   He has been followed with low-dose CT for lung cancer screening.  He had a scan in November 2020 which showed a 13 mm groundglass opacity in the superior segment of the right lower lobe.  He recently had a 1 year follow-up scan which showed the nodule had increased in size to 17 mm and there was an 8 mm solid component.   He quit smoking 2 weeks ago when his CT results became available.  He has a chronic cough and occasional wheezing but does not use any inhalers.  He does not get short of breath with routine activities.  He does use CPAP at night for sleep apnea.  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  He has gained about 15 pounds over the past 93months.  He complains of decreased energy.  He has chronic migraine headaches.  PET CT Scan was obtained and showed no evidence of metastatic disease.  It was felt the patient would benefit from Robotic Assisted Surgical Resection.  The risks and benefits of the procedure were explained to the patient and he was agreeable to proceed.  Hospital Course:  Mitchell Baxter presented to [facility] on 01/31/2021.  He was taken to the operating room and underwent Right Robotic Assisted Video Assisted Thoracoscopy with Right Lower Lobe superior segmentectomy, lymph node dissection, and intercostal nerve block.  He tolerated the procedure without difficulty, was extubated, and taken to the PACU in stable condition.  His respiratory status remained stable.  The CXR on post-op day 1 showed full expansion of both lungs and there was no significant air leak. The chest tube  and foley catheter were removed on the firs post op day and he was mobilized. Follow up CXR showed no pneumothorax and low lung volumes with mild right chest wall subcutaneous emphysema. Small right pleural effusion which is unchanged.

## 2021-01-31 NOTE — Anesthesia Postprocedure Evaluation (Signed)
Anesthesia Post Note  Patient: Mitchell Baxter  Procedure(s) Performed: XI ROBOTIC ASSISTED THORACOSCOPY-RIGHT LOWER LOBE SUPERIOR SEGMENTECTOMY (Right Chest) NODE DISSECTION (Right Chest) INTERCOSTAL NERVE BLOCK (Right Chest)     Patient location during evaluation: PACU Anesthesia Type: General Level of consciousness: awake Pain management: pain level controlled Vital Signs Assessment: post-procedure vital signs reviewed and stable Respiratory status: spontaneous breathing Cardiovascular status: stable Postop Assessment: no apparent nausea or vomiting Anesthetic complications: no   No complications documented.  Last Vitals:  Vitals:   01/31/21 1143 01/31/21 1158  BP: 126/74 127/81  Pulse: 80 80  Resp: 19 16  Temp: 36.6 C   SpO2: 95% 94%    Last Pain:  Vitals:   01/31/21 1143  TempSrc:   PainSc: 0-No pain                 Tennessee Perra

## 2021-01-31 NOTE — Op Note (Signed)
NAME: Mitchell Baxter, Mitchell Baxter MEDICAL RECORD NO: [id number] ACCOUNT NO: [id number] DATE OF BIRTH: 09/29/1951 FACILITY: [facility] LOCATION: [facility] PHYSICIAN: Revonda Standard. Roxan Hockey, MD  Operative Report   DATE OF PROCEDURE: 01/31/2021  PREOPERATIVE DIAGNOSIS:  Right lower lobe lung nodule.  POSTOPERATIVE DIAGNOSIS:  Non-small cell carcinoma, right lower lobe, clinical stage IA (T1 N0).  PROCEDURE PERFORMED:   Xi robotic-assisted right thoracoscopy, Right lower lobe superior segmentectomy, Lymph node dissection, and Intercostal nerve blocks levels 3 through 10.  SURGEON:  Revonda Standard. Roxan Hockey, MD  ASSISTANT:  Ellwood Handler, PA-C.  ANESTHESIA:  General.  FINDINGS:  Nodule in superior segment of right lower lobe.  Frozen section revealed non-small cell carcinoma.  Some mildly enlarged, but otherwise benign-appearing lymph nodes.  CLINICAL NOTE:  Mitchell Baxter is a 70 year old man with a history of tobacco abuse who had a nodule found on low-dose lung cancer screening.  Over time, the nodule has increased in size and then developed a small solid component.  He was advised to undergo  surgical resection. Given the small size and peripheral nature of the lesion isolated to the superior segment of the right lower lobe, it was felt that he could be treated with segmentectomy.  The indications, risks, benefits, and alternatives were  discussed in detail with the patient.  He understood and accepted the risks and agreed to proceed.  OPERATIVE NOTE:  Mitchell Baxter was brought to the preoperative holding area on 01/31/2021.  He had intravenous access and an arterial blood pressure monitoring line established by Anesthesia.  He was taken to the operating room and anesthetized and  intubated with a double-lumen endotracheal tube.  Intravenous antibiotics were administered.  Sequential compression devices were placed on the calves for DVT prophylaxis.  A Foley catheter was placed.  He was placed in a left lateral  decubitus position,  and the right chest was prepped and draped in the usual sterile fashion.  A Bair Hugger was in place for active warming.  Single lung ventilation of the left lung was initiated and was tolerated well throughout the procedure.  A timeout was performed.  A solution containing 20 mL of liposomal bupivacaine, 30 mL of 0.5% bupivacaine, and 50 mL of saline was prepared.  This was used for local at the incision sites as well as for the intercostal nerve blocks.  An incision was made  in the ninth interspace in the mid axillary line.  An 8 mm port was inserted.  The thoracoscope was advanced into the chest.  After confirming intrapleural placement, carbon dioxide was insufflated per protocol.  A 12 mm port was placed in the eighth  interspace 5 cm anterior to the camera port.  Intercostal nerve blocks were performed from the third to the tenth interspace.  10 mL of the bupivacaine solution was injected into a subpleural plane at each level.  A 12 mm AirSeal port was placed in the  tenth interspace posteriorly.  A 12 mm robotic port was placed 5 cm posterior to the camera port in the eighth interspace.  Then, an 8 mm port was placed further posteriorly in the eighth interspace for the retraction arm.  The inferior ligament was divided with bipolar cautery.  All the lymph nodes that were encountered were removed and sent as separate specimens for permanent pathology.  The pleural reflection was divided at the hilum posteriorly and the level 7 nodes  were identified and removed.  The dissection was carried up to the bifurcation of the upper lobe  bronchus from the bronchus intermedius and level 11 nodes were removed at that site.  The upper lobe then was retracted and the pleural reflection overlying  the mediastinum was incised superior to the azygos vein and the level 4 nodes were dissected out.  There were multiple nodes in that area.  There was some bleeding at both the level 7 and 4 sites,  and both areas were packed with Surgicel.  The fissure  then was inspected.  It was relatively incomplete anteriorly.  The fissure between the middle and lower lobes was opened with blunt dissection until the basilar segmental pulmonary artery was visible.  The pleura overlying this then was incised with the  bipolar cautery and dissection was carried out superior to the basilar segmental trunk.  A small portion of the fissure was opened with a stapler. Working further posteriorly, the fissure was almost complete between the superior segment and the upper  lobe.  Most of this remaining portion of the fissure was done with bipolar cautery, although there was a small area that was divided with the stapler.  The basilar and superior segmental pulmonary artery branches were identified.  Nodes were dissected  out from around the vessels, and the superior segmental artery was encircled.  A vessel loop was placed.  The stapler then was used to divide the superior segmental artery branch.  There was a node posterior to this and overlying the bifurcation of the  superior segmental bronchus.  This bronchus was encircled.  The stapler with a blue cartridge was placed across the superior segmental bronchus and closed.  Test inflation showed good aeration of the middle lobe as well as the remainder of the lower lobe.   The stapler was fired transecting the superior segmental bronchus.  10 mL of ICG then was administered intravenously to determine the parenchymal borders of the superior segment.  The superior segmentectomy was completed making sure to encompass the entire segment with  a small rim of the adjacent basilar segment.  This required multiple firings of the robotic stapler using green, blue, and black cartridges.  After completing the segmentectomy, the specimen was placed up superiorly in the chest.  The chest was copiously  irrigated with warm saline.  The test inflation revealed no leakage from the bronchus  or the parenchymal staple lines.  The specimen was placed into an 8 mm bag, brought down to the inferior aspect of the chest.  The robotic instruments were removed and  the robot was undocked.  The anterior eighth interspace incision was enlarged to approximately 2 cm in length.  When attempting to remove the specimen in the 8 mm bag, it was not completely contained in the bag, and the string on the bag broke.  A 12 mm  bag was then placed, and the specimen was placed into that and then was removed easily from the anterior incision.  The chest again was copiously irrigated with warm saline multiple times.  Inspection was made for hemostasis.  There was no bleeding at  the trocar sites or from the lymph node dissection.  The staple lines were all intact with no significant bleeding.  A 28-French Blake drain was placed through the original port incision and secured with a #1 silk suture.  Dual lung ventilation was  resumed.  The remaining incisions were closed in standard fashion.  Dermabond was applied.  The chest tube was placed to Pleur-Evac on waterseal.  All sponge, needle, and instrument counts were correct at  the end of the procedure.  The patient was  extubated in the operating room and taken to the postanesthetic care unit in good condition.   ROH D: 01/31/2021 5:16:09 pm T: 01/31/2021 6:49:00 pm  JOB: [id number]/ [id number]

## 2021-01-31 NOTE — Progress Notes (Signed)
I was paged by nurse because there is some bloody drainage from the right chest tube site. I partially took off the dressing to view chest tube. There is silk suture intact, vaseline gauze around chest tube and minor bloody drainage on bandage. I placed dry 4x4s and additional tape. Nurse informed to contact us if increased bleeding, Will continue to monitor.

## 2021-01-31 NOTE — Anesthesia Procedure Notes (Signed)
Procedure Name: Intubation Date/Time: 01/31/2021 8:09 AM Performed by: Trinna Post., CRNA Pre-anesthesia Checklist: Patient identified, Emergency Drugs available, Suction available, Patient being monitored and Timeout performed Patient Re-evaluated:Patient Re-evaluated prior to induction Oxygen Delivery Method: Circle system utilized Preoxygenation: Pre-oxygenation with 100% oxygen Induction Type: IV induction Ventilation: Mask ventilation without difficulty Laryngoscope Size: Mac and 4 Grade View: Grade I Endobronchial tube: Left, Double lumen EBT, EBT position confirmed by auscultation and EBT position confirmed by fiberoptic bronchoscope and 39 Fr Number of attempts: 1 Airway Equipment and Method: Stylet Placement Confirmation: ETT inserted through vocal cords under direct vision,  positive ETCO2 and breath sounds checked- equal and bilateral Tube secured with: Tape Dental Injury: Teeth and Oropharynx as per pre-operative assessment

## 2021-01-31 NOTE — Progress Notes (Signed)
Moderate amount of serosanguinous drainage noted around chest tube dressing. Drainage marked and MD and PA made aware.

## 2021-01-31 NOTE — Transfer of Care (Signed)
Immediate Anesthesia Transfer of Care Note  Patient: Mitchell Baxter  Procedure(s) Performed: XI ROBOTIC ASSISTED THORACOSCOPY-RIGHT LOWER LOBE SUPERIOR SEGMENTECTOMY (Right Chest) NODE DISSECTION (Right Chest) INTERCOSTAL NERVE BLOCK (Right Chest)  Patient Location: PACU  Anesthesia Type:General  Level of Consciousness: awake and drowsy  Airway & Oxygen Therapy: Patient Spontanous Breathing and Patient connected to face mask oxygen  Post-op Assessment: Report given to RN and Post -op Vital signs reviewed and stable  Post vital signs: Reviewed and stable  Last Vitals:  Vitals Value Taken Time  BP 126/74 01/31/21 1143  Temp    Pulse 79 01/31/21 1146  Resp 22 01/31/21 1146  SpO2 96 % 01/31/21 1146  Vitals shown include unvalidated device data.  Last Pain:  Vitals:   01/31/21 0640  TempSrc: Oral  PainSc:          Complications: No complications documented.

## 2021-01-31 NOTE — Anesthesia Procedure Notes (Signed)
Arterial Line Insertion Start/End4/01/2021 7:00 AM Performed by: Trinna Post., CRNA, CRNA  Patient location: Pre-op. Preanesthetic checklist: patient identified, IV checked, site marked, risks and benefits discussed, surgical consent, monitors and equipment checked, pre-op evaluation, timeout performed and anesthesia consent Lidocaine 1% used for infiltration Left, radial was placed Catheter size: 20 G Hand hygiene performed  and maximum sterile barriers used   Attempts: 1 Procedure performed without using ultrasound guided technique. Following insertion, dressing applied and Biopatch. Post procedure assessment: normal  Patient tolerated the procedure well with no immediate complications.

## 2021-01-31 NOTE — Brief Op Note (Addendum)
01/31/2021  11:26 AM  PATIENT:  Mitchell Baxter  70 y.o. male  PRE-OPERATIVE DIAGNOSIS:  RIGHT LOWER LOBE LUNG NODULE  POST-OPERATIVE DIAGNOSIS:  NON-SMALL CELL CARCINOMA RIGHT LOWER LOBE, CLINICAL STAGE IA(T1N0)  PROCEDURE:  Procedure(s):  XI ROBOTIC ASSISTED THORACOSCOPY-RIGHT LOWER LOBE SUPERIOR SEGMENTECTOMY (Right) LYMPH NODE DISSECTION (Right) INTERCOSTAL NERVE BLOCK (Right)  SURGEON:  Surgeon(s) and Role:    Melrose Nakayama, MD - Primary  PHYSICIAN ASSISTANT: Ellwood Handler PA-C  ANESTHESIA:   general  EBL:  50 mL   BLOOD ADMINISTERED:none  DRAINS: 28 Blake Drain Right Chest   LOCAL MEDICATIONS USED:  BUPIVICAINE   SPECIMEN:  Source of Specimen:  Right Lower Lobe Superior Segment, Lymph nodes  DISPOSITION OF SPECIMEN:  PATHOLOGY  COUNTS:  YES  TOURNIQUET:  * No tourniquets in log *  DICTATION: .Dragon Dictation  PLAN OF CARE: Admit to inpatient   PATIENT DISPOSITION:  PACU - hemodynamically stable.   Delay start of Pharmacological VTE agent (>24hrs) due to surgical blood loss or risk of bleeding: no

## 2021-02-01 ENCOUNTER — Inpatient Hospital Stay (HOSPITAL_COMMUNITY): Payer: Medicare HMO

## 2021-02-01 ENCOUNTER — Encounter (HOSPITAL_COMMUNITY): Payer: Self-pay | Admitting: Thoracic Surgery (Cardiothoracic Vascular Surgery)

## 2021-02-01 LAB — BASIC METABOLIC PANEL
Anion gap: 6 (ref 5–15)
BUN: 16 mg/dL (ref 8–23)
CO2: 25 mmol/L (ref 22–32)
Calcium: 8.5 mg/dL — ABNORMAL LOW (ref 8.9–10.3)
Chloride: 105 mmol/L (ref 98–111)
Creatinine, Ser: 1.31 mg/dL — ABNORMAL HIGH (ref 0.61–1.24)
GFR, Estimated: 58 mL/min — ABNORMAL LOW (ref >60)
Glucose, Bld: 133 mg/dL — ABNORMAL HIGH (ref 70–99)
Potassium: 4.8 mmol/L (ref 3.5–5.1)
Sodium: 136 mmol/L (ref 135–145)

## 2021-02-01 LAB — CBC
HCT: 42.6 % (ref 39.0–52.0)
Hemoglobin: 14.3 g/dL (ref 13.0–17.0)
MCH: 32.6 pg (ref 26.0–34.0)
MCHC: 33.6 g/dL (ref 30.0–36.0)
MCV: 97 fL (ref 80.0–100.0)
Platelets: 213 10*3/uL (ref 150–400)
RBC: 4.39 MIL/uL (ref 4.22–5.81)
RDW: 12.9 % (ref 11.5–15.5)
WBC: 13.6 10*3/uL — ABNORMAL HIGH (ref 4.0–10.5)
nRBC: 0 % (ref 0.0–0.2)

## 2021-02-01 MED ORDER — MORPHINE SULFATE (PF) 2 MG/ML IV SOLN
2.0000 mg | INTRAVENOUS | Status: DC | PRN
  Administered 2021-02-01: 2 mg via INTRAVENOUS
  Filled 2021-02-01: qty 1

## 2021-02-01 MED ORDER — SUMATRIPTAN SUCCINATE 50 MG PO TABS
50.0000 mg | ORAL_TABLET | ORAL | Status: DC | PRN
  Filled 2021-02-01: qty 1

## 2021-02-01 MED ORDER — ALBUTEROL SULFATE (2.5 MG/3ML) 0.083% IN NEBU: 2.5000 mg | INHALATION_SOLUTION | Freq: Once | RESPIRATORY_TRACT | Status: DC | PRN

## 2021-02-01 MED ORDER — RIZATRIPTAN BENZOATE 10 MG PO TBDP: 10.0000 mg | ORAL_TABLET | ORAL | Status: DC | PRN

## 2021-02-01 MED ORDER — GUAIFENESIN ER 600 MG PO TB12
600.0000 mg | ORAL_TABLET | Freq: Two times a day (BID) | ORAL | Status: DC | PRN
  Administered 2021-02-01: 600 mg via ORAL
  Filled 2021-02-01: qty 1

## 2021-02-01 MED ORDER — ALBUTEROL SULFATE (2.5 MG/3ML) 0.083% IN NEBU
2.5000 mg | INHALATION_SOLUTION | Freq: Four times a day (QID) | RESPIRATORY_TRACT | Status: DC | PRN
  Administered 2021-02-01: 2.5 mg via RESPIRATORY_TRACT
  Filled 2021-02-01: qty 3

## 2021-02-01 MED ORDER — FUROSEMIDE 10 MG/ML IJ SOLN
40.0000 mg | Freq: Once | INTRAMUSCULAR | Status: AC
  Administered 2021-02-01: 40 mg via INTRAVENOUS
  Filled 2021-02-01: qty 4

## 2021-02-01 NOTE — Progress Notes (Signed)
Pt SOB on 2L. Rhonchi  Pt very clammy.  Provider informed. Chest Xray order.

## 2021-02-01 NOTE — Progress Notes (Signed)
      Mitchell Baxter       [place] [address]             [phone]      CTSP for wheezing and shortness of breath  Mr. [name] became short of breath this evening with c/o feeling congested and incisional pain not relieved with oxycodone.  BP 119/77 (BP Location: Right Arm)   Pulse 79   Temp 98.6 F (37 C) (Oral)   Resp 20   Ht 6' (1.829 m)   Wt 116.1 kg   SpO2 95%   BMI 34.72 kg/m  93% sat on 4L [place]  On exam increased WOB and anxiety Lungs with wheezing bilaterally, equal BS  CXR done, reading pending, but no pneumothorax or significant effusion, may be a little wet  Will give morphine for pain, albuterol nebs and will give 40 mg of Lasix as well.  Revonda Standard Roxan Hockey, MD [facility] [phone]

## 2021-02-01 NOTE — Significant Event (Signed)
Rapid Response Event Note   Reason for Call :  SOB  Initial Focused Assessment:  Pt lying in bed with eyes closed, alert and oriented. Pt c/o SOB and pain. He says he feels like he can't catch his breath or cough up his secretions. Pt breathing is mildly labored. Lungs wheezy, decreased in the bases. ABG large, soft, distended(per pt this is baseline). Skin cool to touch.  Pt has IS at bedside but says he hasn't been using it d/t pain. Purpose/importance of IS explained to pt. Pt receptive to teaching.  T-98.6, HR-79, BP-119/77, RR-20, SpO2-91% on 2L [place].   Interventions:  PCXR-Low lung volumes with vascular crowding and streaky bibasilar atelectasis. Albuterol tx 40mg  lasix IV 2mg  morphine IV Mucinex/flutter valve.   Plan of Care:  Pt breathing is better after interventions. PRN morphine and albuterol ordered. Continue to monitor closely. Call RRT if further assistance needed.   Event Summary:   MD Notified: Dr. Roxan Hockey notified and came to bedside Call [place], Devontaye Ground Anderson, RN

## 2021-02-01 NOTE — Progress Notes (Addendum)
      [address]       [place] [address]             [phone]      1 Day Post-Op Procedure(s) (LRB): XI ROBOTIC ASSISTED THORACOSCOPY-RIGHT LOWER LOBE SUPERIOR SEGMENTECTOMY (Right) NODE DISSECTION (Right) INTERCOSTAL NERVE BLOCK (Right) Subjective: Awake and alert, having some right shoulder and back pain.   Objective: Vital signs in last 24 hours: Temp:  [97.8 F (36.6 C)-98.6 F (37 C)] 98.6 F (37 C) (04/04 1950) Pulse Rate:  [68-81] 68 (04/05 0300) Cardiac Rhythm: Normal sinus rhythm (04/04 1912) Resp:  [12-22] 20 (04/05 0300) BP: (93-128)/(62-89) 96/62 (04/05 0200) SpO2:  [93 %-97 %] 96 % (04/05 0300) Arterial Line BP: (118-176)/(52-62) 118/54 (04/04 1413)     Intake/Output from previous day: 04/04 0701 - 04/05 0700 In: 1820 [P.O.:420; I.V.:1300; IV Piggyback:100] Out: 627 [Urine:475; Blood:50; Chest Tube:102] Intake/Output this shift: No intake/output data recorded.  General appearance: alert, cooperative and mild distress Neurologic: intact Heart: RRR, NSR in mid 60's. Lungs: Breath sounds are clear, very small air leak with cough. Minimal CT drainage.  Wound: The CT is well secured and the right chest dressing is dry.  Lab Results: Recent Labs    02/01/21 0030  WBC 13.6*  HGB 14.3  HCT 42.6  PLT 213   BMET:  Recent Labs    02/01/21 0030  NA 136  K 4.8  CL 105  CO2 25  GLUCOSE 133*  BUN 16  CREATININE 1.31*  CALCIUM 8.5*    PT/INR: No results for input(s): LABPROT, INR in the last 72 hours. ABG    Component Value Date/Time   PHART 7.415 01/27/2021 1347   HCO3 25.0 01/27/2021 1347   O2SAT 96.8 01/27/2021 1347    EXAM: PORTABLE CHEST 1 VIEW  COMPARISON:  01/31/2021.  FINDINGS: Chest tube noted stable position. No pneumothorax. Postsurgical changes right lung. Mild atelectasis right lung base. Tiny bilateral pleural effusions cannot be excluded. Stable cardiomegaly. Mild right chest wall subcutaneous  emphysema again noted.  IMPRESSION: 1. Right chest tube in stable position. No pneumothorax. Right chest wall subcutaneous emphysema again noted.  2. Postsurgical changes right lung. Mild atelectasis right lung base. Tiny bilateral pleural effusions cannot be excluded.  3.  Stable cardiomegaly.   Electronically Signed   By: Marcello Moores  Register   On: 02/01/2021 06:53  Assessment/Plan: S/P Procedure(s) (LRB): XI ROBOTIC ASSISTED THORACOSCOPY-RIGHT LOWER LOBE SUPERIOR SEGMENTECTOMY (Right) NODE DISSECTION (Right) INTERCOSTAL NERVE BLOCK (Right)  -POD1 robotic-assisted right lower lobe superior segmentectomy for stage IA NSCLC.  Stable VS and respiratory status. Right lung well expanded with chest tube on water seal. Tiny air leak with cough only. Continue water seal, mobilize, d/c Foley catheter  -History of hypertension- MAP 70's-80. Irbesartan resumed.   -History of CAD and dyslipidemia-ASA and Lipitor resumed.   -DVT PPX- SQ enoxaparin daily.    LOS: 1 day    Mitchell Baxter , PA-C [phone] 02/01/2021 Patient seen and examined, agree with above No air leak when I examined him- will dc chest tube Ambulate  Remo Lipps C. Roxan Hockey, MD [facility] [phone]

## 2021-02-02 ENCOUNTER — Inpatient Hospital Stay (HOSPITAL_COMMUNITY): Payer: Medicare HMO

## 2021-02-02 LAB — COMPREHENSIVE METABOLIC PANEL
ALT: 17 U/L (ref 0–44)
AST: 23 U/L (ref 15–41)
Albumin: 3.3 g/dL — ABNORMAL LOW (ref 3.5–5.0)
Alkaline Phosphatase: 35 U/L — ABNORMAL LOW (ref 38–126)
Anion gap: 9 (ref 5–15)
BUN: 17 mg/dL (ref 8–23)
CO2: 27 mmol/L (ref 22–32)
Calcium: 8.8 mg/dL — ABNORMAL LOW (ref 8.9–10.3)
Chloride: 101 mmol/L (ref 98–111)
Creatinine, Ser: 1.24 mg/dL (ref 0.61–1.24)
GFR, Estimated: >60 mL/min (ref >60)
Glucose, Bld: 121 mg/dL — ABNORMAL HIGH (ref 70–99)
Potassium: 3.5 mmol/L (ref 3.5–5.1)
Sodium: 137 mmol/L (ref 135–145)
Total Bilirubin: 0.9 mg/dL (ref 0.3–1.2)
Total Protein: 6.3 g/dL — ABNORMAL LOW (ref 6.5–8.1)

## 2021-02-02 LAB — CBC
HCT: 45.4 % (ref 39.0–52.0)
Hemoglobin: 15.2 g/dL (ref 13.0–17.0)
MCH: 31.9 pg (ref 26.0–34.0)
MCHC: 33.5 g/dL (ref 30.0–36.0)
MCV: 95.4 fL (ref 80.0–100.0)
Platelets: 203 10*3/uL (ref 150–400)
RBC: 4.76 MIL/uL (ref 4.22–5.81)
RDW: 13 % (ref 11.5–15.5)
WBC: 11.2 10*3/uL — ABNORMAL HIGH (ref 4.0–10.5)
nRBC: 0 % (ref 0.0–0.2)

## 2021-02-02 MED ORDER — ALBUTEROL SULFATE (2.5 MG/3ML) 0.083% IN NEBU
2.5000 mg | INHALATION_SOLUTION | Freq: Four times a day (QID) | RESPIRATORY_TRACT | Status: DC
  Administered 2021-02-02: 2.5 mg via RESPIRATORY_TRACT
  Filled 2021-02-02: qty 3

## 2021-02-02 MED ORDER — ALBUTEROL SULFATE (2.5 MG/3ML) 0.083% IN NEBU: 2.5000 mg | INHALATION_SOLUTION | RESPIRATORY_TRACT | Status: DC | PRN

## 2021-02-02 MED ORDER — ALBUTEROL SULFATE (2.5 MG/3ML) 0.083% IN NEBU
2.5000 mg | INHALATION_SOLUTION | Freq: Two times a day (BID) | RESPIRATORY_TRACT | Status: DC
  Administered 2021-02-02: 2.5 mg via RESPIRATORY_TRACT
  Filled 2021-02-02: qty 3

## 2021-02-02 NOTE — Plan of Care (Signed)
  Problem: Education: Goal: Knowledge of General Education information will improve Description: Including pain rating scale, medication(s)/side effects and non-pharmacologic comfort measures Outcome: Progressing   Problem: Health Behavior/Discharge Planning: Goal: Ability to manage health-related needs will improve Outcome: Progressing   Problem: Clinical Measurements: Goal: Ability to maintain clinical measurements within normal limits will improve Outcome: Progressing   Problem: Clinical Measurements: Goal: Respiratory complications will improve Outcome: Progressing   Problem: Activity: Goal: Risk for activity intolerance will decrease Outcome: Progressing   Problem: Nutrition: Goal: Adequate nutrition will be maintained Outcome: Progressing   Problem: Pain Managment: Goal: General experience of comfort will improve Outcome: Progressing   Problem: Skin Integrity: Goal: Risk for impaired skin integrity will decrease Outcome: Progressing

## 2021-02-02 NOTE — Plan of Care (Signed)
  Problem: Education: Goal: Knowledge of General Education information will improve Description: Including pain rating scale, medication(s)/side effects and non-pharmacologic comfort measures Outcome: Progressing   Problem: Health Behavior/Discharge Planning: Goal: Ability to manage health-related needs will improve Outcome: Progressing   Problem: Clinical Measurements: Goal: Diagnostic test results will improve Outcome: Progressing   Problem: Nutrition: Goal: Adequate nutrition will be maintained Outcome: Progressing   Problem: Coping: Goal: Level of anxiety will decrease Outcome: Progressing

## 2021-02-02 NOTE — Discharge Instructions (Signed)

## 2021-02-02 NOTE — Discharge Summary (Addendum)
[address]       [place] [address]             [phone]      Physician Discharge Summary  Patient ID: Mitchell Baxter MRN: [id number] DOB/AGE: [birth year] 70 y.o.  Admit date: 01/31/2021 Discharge date: 02/03/2021  Admission Diagnoses:  Patient Active Problem List   Diagnosis Date Noted  . Right lower lobe lung nodule 01/31/2021  . OSA (obstructive sleep apnea) 11/27/2019  . Coronary artery disease of native artery of native heart with stable angina pectoris ([place]) 10/15/2019  . Heavy smoker 10/15/2019  . OSA and COPD overlap syndrome ([place]) 10/06/2019    Discharge Diagnoses:  Adenocarcinoma of the lung, right lower lobe. Clinical and Pathologic stage IA(T1N0) Active Problems:   S/P robot-assisted surgical procedure   Discharged Condition: good   History of Present Illness:  Mitchell Baxter is sent for consultation regarding a suspicious right lower lobe lung nodule.   Mitchell Baxter is a 70 year old man with a history of tobacco abuse, COPD, hypertension, hyperlipidemia, severe migraines, sleep apnea, vertigo, reflux with previous hiatal hernia repair, and depression.  He has smoked about 1 pack of cigarettes daily for 40 years.   He has been followed with low-dose CT for lung cancer screening.  He had a scan in November 2020 which showed a 13 mm groundglass opacity in the superior segment of the right lower lobe.  He recently had a 1 year follow-up scan which showed the nodule had increased in size to 17 mm and there was an 8 mm solid component.   He quit smoking 2 weeks ago when his CT results became available.  He has a chronic cough and occasional wheezing but does not use any inhalers.  He does not get short of breath with routine activities.  He does use CPAP at night for sleep apnea.  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  He has gained about 15 pounds over the past 79months.  He complains of decreased energy.  He has chronic migraine  headaches.  PET CT Scan was obtained and showed no evidence of metastatic disease.  It was felt the patient would benefit from Robotic Assisted Surgical Resection.  The risks and benefits of the procedure were explained to the patient and he was agreeable to proceed.  Hospital Course:  Mr. Kling presented to [facility] on 01/31/2021.  He was taken to the operating room and underwent Right Robotic Assisted Video Assisted Thoracoscopy with Right Lower Lobe superior segmentectomy, lymph node dissection, and intercostal nerve block.  He tolerated the procedure without difficulty, was extubated, and taken to the PACU in stable condition.  His respiratory status remained stable.  The CXR on post-op day 1 showed full expansion of both lungs and there was no significant air leak. The chest tube and foley catheter were removed on the firs post op day and he was mobilized. Follow up CXR showed no pneumothorax and low lung volumes with mild right chest wall subcutaneous emphysema. Small right pleural effusion which is unchanged. He has had some issues with wheezing and we scheduled albuterol for him to use. He continued to occasionally need IV pain medication. He continued to progress and was weaned off oxygen. Today, he is ambulating with limited assistance, he is tolerating room air, his incisions are healing well, and he is ready for discharge home.   Consults: None  Significant Diagnostic Studies:  CLINICAL  DATA:  Shortness of breath.  Prior chest tube removal.  EXAM: CHEST - 2 VIEW  COMPARISON:  02/01/2021.  FINDINGS: Mediastinum and hilar structures normal. Heart size stable. Postsurgical changes right lung. Lung volumes with persistent bibasilar atelectasis. Small right pleural effusion, unchanged. No pneumothorax. Mild right chest wall subcutaneous emphysema.  No acute bony abnormality identified.  IMPRESSION: 1. Postsurgical changes right lung. Low lung volumes with  mild bibasilar atelectasis. Small right pleural effusion, unchanged.  2.  No pneumothorax.  Mild right chest wall subcutaneous emphysema.   Electronically Signed   By: Marcello Moores  Register   On: 02/02/2021 07:00   Treatments:   NAME: Mitchell Baxter MEDICAL RECORD NO: [id number] ACCOUNT NO: [id number] DATE OF BIRTH: 01/17/1951 FACILITY: [facility] LOCATION: [facility] PHYSICIAN: Revonda Standard. Roxan Hockey, MD  Operative Report   DATE OF PROCEDURE: 01/31/2021  PREOPERATIVE DIAGNOSIS:  Right lower lobe lung nodule.  POSTOPERATIVE DIAGNOSIS:  Non-small cell carcinoma, right lower lobe, clinical stage IA (T1 N0).  PROCEDURE PERFORMED:  Xi robotic-assisted right thoracoscopy, right lower lobe superior segmentectomy, lymph node dissection, and intercostal nerve blocks levels 3 through 10.  SURGEON:  Revonda Standard. Roxan Hockey, MD  ASSISTANTEllwood Handler.  ANESTHESIA:  General.  FINDINGS:  Nodule in superior segment of right lower lobe.  Frozen section revealed non-small cell carcinoma.  Some mildly enlarged, but otherwise benign-appearing lymph nodes.   Discharge Exam: Blood pressure 116/63, pulse 86, temperature 98 F (36.7 C), temperature source Oral, resp. rate 17, height 6' (1.829 m), weight 108.5 kg, SpO2 100 %.     Disposition: Discharge disposition: 01-Home or Self Care        Allergies as of 02/03/2021   No Known Allergies     Medication List    STOP taking these medications   Erenumab-aooe 140 MG/ML Soaj Commonly known as: Aimovig     TAKE these medications   acetaminophen 500 MG tablet Commonly known as: TYLENOL Take 2 tablets (1,000 mg total) by mouth every 6 (six) hours.   aspirin EC 81 MG tablet Take 1 tablet (81 mg total) by mouth daily. What changed: when to take this   atorvastatin 40 MG tablet Commonly known as: LIPITOR Take 40 mg by mouth at bedtime.   FLUoxetine 40 MG capsule Commonly known as: PROZAC Take 40 mg by mouth every morning.    fluticasone 50 MCG/ACT nasal spray Commonly known as: FLONASE Place 1 spray into both nostrils daily for 10 days.   gabapentin 300 MG capsule Commonly known as: NEURONTIN Take 300 mg by mouth at bedtime.   Ipratropium-Albuterol 20-100 MCG/ACT Aers respimat Commonly known as: COMBIVENT Inhale 1 puff into the lungs every 6 (six) hours as needed for up to 15 days for wheezing or shortness of breath.   ketoconazole 2 % shampoo Commonly known as: NIZORAL Apply 1 application topically 2 (two) times a week.   Linzess 290 MCG Caps capsule Generic drug: linaclotide Take 290 mcg by mouth daily as needed for constipation.   MELATONIN PO Take 1 tablet by mouth at bedtime.   omeprazole 20 MG capsule Commonly known as: PRILOSEC Take 20 mg by mouth daily.   rizatriptan 10 MG disintegrating tablet Commonly known as: MAXALT-MLT Take 1 tablet (10 mg total) by mouth as needed for migraine. May repeat in 2 hours if needed. Max 2x a day   traMADol 50 MG tablet Commonly known as: Ultram Take 1-2 tablets (50-100 mg total) by mouth every 6 (six) hours as needed.   traZODone  50 MG tablet Commonly known as: DESYREL Take 50 mg by mouth at bedtime as needed for sleep.   valsartan-hydrochlorothiazide 160-12.5 MG tablet Commonly known as: DIOVAN-HCT Take 0.5 tablets by mouth at bedtime.   Vitamin D3 125 MCG (5000 UT) Caps Take 5,000 Units by mouth daily.       Follow-up Information    Melrose Nakayama, MD Follow up.   Specialty: Cardiothoracic Surgery Why: Your routine follow-up appointment is on 4/18 at 4:30pm. Please arrive at 4:00pm for a chest xray located at [facility] which is on the first floor of our building Contact information: [place] [address] [place], [place], [place]. Call in 1 day(s).   Specialty: Internal Medicine Contact information: [address] [place]  [address] [phone]               Signed: Elgie Collard 02/03/2021, 11:31 AM

## 2021-02-02 NOTE — Care Management Important Message (Signed)
Important Message  Patient Details  Name: Mitchell Baxter MRN: [id number] Date of Birth: 1951-02-28   Medicare Important Message Given:  Yes     Shion Bluestein 02/02/2021, 10:03 AM

## 2021-02-02 NOTE — Progress Notes (Signed)
2 Days Post-Op Procedure(s) (LRB): XI ROBOTIC ASSISTED THORACOSCOPY-RIGHT LOWER LOBE SUPERIOR SEGMENTECTOMY (Right) NODE DISSECTION (Right) INTERCOSTAL NERVE BLOCK (Right) Subjective: Feels better this AM  Objective: Vital signs in last 24 hours: Temp:  [98.4 F (36.9 C)-98.8 F (37.1 C)] 98.8 F (37.1 C) (04/06 0312) Pulse Rate:  [71-79] 73 (04/06 0312) Cardiac Rhythm: Normal sinus rhythm (04/06 0701) Resp:  [15-21] 15 (04/06 0312) BP: (99-119)/(59-83) 105/74 (04/06 0312) SpO2:  [91 %-95 %] 94 % (04/06 0312)  Hemodynamic parameters for last 24 hours:    Intake/Output from previous day: 04/05 0701 - 04/06 0700 In: 1395.4 [P.O.:1080; I.V.:315.4] Out: 2550 [Urine:2450; Chest Tube:100] Intake/Output this shift: No intake/output data recorded.  General appearance: alert, cooperative and no distress Neurologic: intact Heart: regular rate and rhythm Lungs: diminished breath sounds bibasilar and wheezes bibasilar Wound: clean  Lab Results: Recent Labs    02/01/21 0030  WBC 13.6*  HGB 14.3  HCT 42.6  PLT 213   BMET:  Recent Labs    02/01/21 0030  NA 136  K 4.8  CL 105  CO2 25  GLUCOSE 133*  BUN 16  CREATININE 1.31*  CALCIUM 8.5*    PT/INR: No results for input(s): LABPROT, INR in the last 72 hours. ABG    Component Value Date/Time   PHART 7.415 01/27/2021 1347   HCO3 25.0 01/27/2021 1347   O2SAT 96.8 01/27/2021 1347   CBG (last 3)  No results for input(s): GLUCAP in the last 72 hours.  Assessment/Plan: S/P Procedure(s) (LRB): XI ROBOTIC ASSISTED THORACOSCOPY-RIGHT LOWER LOBE SUPERIOR SEGMENTECTOMY (Right) NODE DISSECTION (Right) INTERCOSTAL NERVE BLOCK (Right) POD # 2  Looks much better this AM In SR, BP OK Wheezing significantly improved but still has some at bases- will change albuterol to a standing dose for 24 hours CXR shows bibasilar atelectasis- continue IS, Start flutter valve SCD + enoxaparin for DVT prophylaxis Labs pending   LOS: 2  days    Melrose Nakayama 02/02/2021

## 2021-02-03 ENCOUNTER — Inpatient Hospital Stay (HOSPITAL_COMMUNITY): Payer: Medicare HMO

## 2021-02-03 LAB — SURGICAL PATHOLOGY

## 2021-02-03 MED ORDER — IPRATROPIUM-ALBUTEROL 20-100 MCG/ACT IN AERS: 1.0000 | INHALATION_SPRAY | Freq: Four times a day (QID) | RESPIRATORY_TRACT | 0 refills | Status: DC | PRN

## 2021-02-03 MED ORDER — TRAMADOL HCL 50 MG PO TABS: 50.0000 mg | ORAL_TABLET | Freq: Four times a day (QID) | ORAL | 0 refills | Status: DC | PRN

## 2021-02-03 MED ORDER — FLUTICASONE PROPIONATE 50 MCG/ACT NA SUSP
1.0000 | Freq: Every day | NASAL | Status: DC
  Administered 2021-02-03: 1 via NASAL
  Filled 2021-02-03: qty 16

## 2021-02-03 MED ORDER — ACETAMINOPHEN 500 MG PO TABS: 1000.0000 mg | ORAL_TABLET | Freq: Four times a day (QID) | ORAL | 0 refills | Status: AC

## 2021-02-03 MED ORDER — FLUTICASONE PROPIONATE 50 MCG/ACT NA SUSP: 1.0000 | Freq: Every day | NASAL | 0 refills | Status: DC

## 2021-02-03 NOTE — Progress Notes (Addendum)
      [address]       [place] [address]             [phone]      3 Days Post-Op Procedure(s) (LRB): XI ROBOTIC ASSISTED THORACOSCOPY-RIGHT LOWER LOBE SUPERIOR SEGMENTECTOMY (Right) NODE DISSECTION (Right) INTERCOSTAL NERVE BLOCK (Right) Subjective: Feels good this morning, he does have some pain with coughing which is has been doing frequently.   Objective: Vital signs in last 24 hours: Temp:  [98.1 F (36.7 C)-98.7 F (37.1 C)] 98.3 F (36.8 C) (04/07 0358) Pulse Rate:  [78-87] 78 (04/07 0358) Cardiac Rhythm: Normal sinus rhythm (04/07 0712) Resp:  [16-20] 19 (04/07 0358) BP: (104-129)/(70-80) 117/70 (04/07 0358) SpO2:  [95 %-98 %] 98 % (04/07 0358) Weight:  [108.5 kg] 108.5 kg (04/07 0432)     Intake/Output from previous day: 04/06 0701 - 04/07 0700 In: 1226 [P.O.:1226] Out: -  Intake/Output this shift: No intake/output data recorded.  General appearance: alert, cooperative and no distress Neurologic: intact Heart: regular rate and rhythm Lungs: diminished breath sounds bibasilar and wheezes bibasilar Wound: clean  Lab Results: Recent Labs    02/01/21 0030 02/02/21 0658  WBC 13.6* 11.2*  HGB 14.3 15.2  HCT 42.6 45.4  PLT 213 203   BMET:  Recent Labs    02/01/21 0030 02/02/21 0658  NA 136 137  K 4.8 3.5  CL 105 101  CO2 25 27  GLUCOSE 133* 121*  BUN 16 17  CREATININE 1.31* 1.24  CALCIUM 8.5* 8.8*    PT/INR: No results for input(s): LABPROT, INR in the last 72 hours. ABG    Component Value Date/Time   PHART 7.415 01/27/2021 1347   HCO3 25.0 01/27/2021 1347   O2SAT 96.8 01/27/2021 1347   CBG (last 3)  No results for input(s): GLUCAP in the last 72 hours.  Assessment/Plan: S/P Procedure(s) (LRB): XI ROBOTIC ASSISTED THORACOSCOPY-RIGHT LOWER LOBE SUPERIOR SEGMENTECTOMY (Right) NODE DISSECTION (Right) INTERCOSTAL NERVE BLOCK (Right)  1. CV-NSR in the 70s, BP well controlled 2. Pulm- CXR shows bibasilar  atelectasis. Encouraged to use flutter valve and incentive spirometer 10x/hour. Wheezing improved but continue albuterol as needed. Will prescribe combivent at d/c 3. Renal-creatinine 1.24, down slightly, continue to avoid nephrotoxic drugs 4. H and H stable  5. Pathology pending 6. Continue lovenox for DVT prophylaxis  Plan: May need home oxygen, will do a 6 minute walk test without oxygen to determine what he will need. Continue to use incentive spirometer/flutter valve hourly. I will check back with him later this morning and see if he is ready for discharge. Later today vs. Tomorrow for d/c.    LOS: 3 days    Elgie Collard 02/03/2021 Patient seen and examined, agree with above Path still pending Will send home with inhaler Still has a fair amount of atelectasis on CXR. Home later today if off O2, if not keep for another 24 hours for additional pulmonary toilet  Remo Lipps C. Roxan Hockey, MD [facility] [phone]

## 2021-02-03 NOTE — Progress Notes (Signed)
SATURATION QUALIFICATIONS: (This note is used to comply with regulatory documentation for home oxygen)  Patient Saturations on Room Air at Rest = 96%  Patient Saturations on Room Air while Ambulating = 92-94%  Patient Saturations on 1 Liters of oxygen while Ambulating = 96%  Please briefly explain why patient needs home oxygen:

## 2021-02-10 ENCOUNTER — Other Ambulatory Visit: Payer: Self-pay | Admitting: Thoracic Surgery (Cardiothoracic Vascular Surgery)

## 2021-02-10 DIAGNOSIS — Z9889 Other specified postprocedural states: Secondary | ICD-10-CM

## 2021-02-14 ENCOUNTER — Other Ambulatory Visit: Payer: Self-pay

## 2021-02-14 ENCOUNTER — Encounter: Payer: Self-pay | Admitting: Thoracic Surgery (Cardiothoracic Vascular Surgery)

## 2021-02-14 ENCOUNTER — Ambulatory Visit
Admission: RE | Admit: 2021-02-14 | Discharge: 2021-02-14 | Disposition: A | Discharge status: Home / Self Care | Payer: Medicare HMO | Source: Ambulatory Visit | Attending: Thoracic Surgery (Cardiothoracic Vascular Surgery) | Admitting: Thoracic Surgery (Cardiothoracic Vascular Surgery)

## 2021-02-14 ENCOUNTER — Ambulatory Visit (INDEPENDENT_AMBULATORY_CARE_PROVIDER_SITE_OTHER): Payer: Self-pay | Admitting: Thoracic Surgery (Cardiothoracic Vascular Surgery)

## 2021-02-14 VITALS — BP 157/82 | HR 78 | Resp 18 | Ht 72.0 in | Wt 254.0 lb

## 2021-02-14 DIAGNOSIS — Z9889 Other specified postprocedural states: Secondary | ICD-10-CM

## 2021-02-14 MED ORDER — OXYCODONE HCL 5 MG PO TABS: 5.0000 mg | ORAL_TABLET | Freq: Four times a day (QID) | ORAL | 0 refills | Status: DC | PRN

## 2021-02-14 MED ORDER — IPRATROPIUM-ALBUTEROL 20-100 MCG/ACT IN AERS: 1.0000 | INHALATION_SPRAY | Freq: Four times a day (QID) | RESPIRATORY_TRACT | 0 refills | Status: AC | PRN

## 2021-02-14 NOTE — Progress Notes (Signed)
[address]       [place] [address]             [phone]    HPI: [name] returns for follow-up after his recent lung resection  Mitchell Baxter is a 70 year old man with a history of tobacco abuse, COPD, hypertension, hyperlipidemia migraines, sleep apnea, vertigo, reflux, and depression.  He had a 40-pack-year history of smoking.  He was first found to have a groundglass opacity in the superior segment of his right lower lobe in November 2020 on a low-dose screening CT.  Over time that increased in size and developed a solid component.  I did a right lower lobe superior segmentectomy and node dissection on 01/31/2021.  Nodule turned out to be an adenocarcinoma, stage Ia (T1, N0).  He did well postoperatively and went home on day 3.  Complains of a lot of incisional pain.  He says tramadol is not helping at all.  He takes gabapentin 300 mg nightly.  His wife says he has been wheezing a lot.  He is still using his incentive spirometer.  Past Medical History:  Diagnosis Date  . Chronic migraine w/o aura, not intractable, w stat migr   . COPD (chronic obstructive pulmonary disease) ([place])   . Depression   . GERD (gastroesophageal reflux disease)   . Headache(784.0)    migraine  . Heart murmur    very mild AS 01/26/21 echo  . Hyperlipidemia   . Hypertension   . Shortness of breath    exertion  . Sleep apnea    stopbang=4  . Vertigo     Current Outpatient Medications  Medication Sig Dispense Refill  . acetaminophen (TYLENOL) 500 MG tablet Take 2 tablets (1,000 mg total) by mouth every 6 (six) hours. 30 tablet 0  . aspirin EC 81 MG tablet Take 1 tablet (81 mg total) by mouth daily. (Patient taking differently: Take 81 mg by mouth at bedtime.) 30 tablet 3  . atorvastatin (LIPITOR) 40 MG tablet Take 40 mg by mouth at bedtime.     . Cholecalciferol (VITAMIN D3) 125 MCG (5000 UT) CAPS Take 5,000 Units by mouth daily.    Marland Kitchen FLUoxetine (PROZAC) 40 MG capsule Take  40 mg by mouth every morning.    . gabapentin (NEURONTIN) 300 MG capsule Take 300 mg by mouth in the morning and at bedtime.  6  . ketoconazole (NIZORAL) 2 % shampoo Apply 1 application topically 2 (two) times a week.    Marland Kitchen LINZESS 290 MCG CAPS capsule Take 290 mcg by mouth daily as needed for constipation.    Marland Kitchen MELATONIN PO Take 1 tablet by mouth at bedtime.    Marland Kitchen omeprazole (PRILOSEC) 20 MG capsule Take 20 mg by mouth daily.    Marland Kitchen oxyCODONE (OXY IR/ROXICODONE) 5 MG immediate release tablet Take 1 tablet (5 mg total) by mouth every 6 (six) hours as needed for severe pain. 30 tablet 0  . rizatriptan (MAXALT-MLT) 10 MG disintegrating tablet Take 1 tablet (10 mg total) by mouth as needed for migraine. May repeat in 2 hours if needed. Max 2x a day 9 tablet 11  . traZODone (DESYREL) 50 MG tablet Take 50 mg by mouth at bedtime as needed for sleep.    . valsartan-hydrochlorothiazide (DIOVAN-HCT) 160-12.5 MG tablet Take 0.5 tablets by mouth at bedtime.     . fluticasone (FLONASE) 50 MCG/ACT nasal spray Place 1 spray into both nostrils daily for 10 days. 1 mL  0  . Ipratropium-Albuterol (COMBIVENT) 20-100 MCG/ACT AERS respimat Inhale 1 puff into the lungs every 6 (six) hours as needed for wheezing or shortness of breath. 2 g 0   No current facility-administered medications for this visit.    Physical Exam BP (!) 157/82 (BP Location: Right Arm, Patient Position: Sitting, Cuff Size: Large)   Pulse 78   Resp 18   Ht 6' (1.829 m)   Wt 254 lb (115.2 kg)   SpO2 93% Comment: RA  BMI 34.68 kg/m  70 year old man in no acute distress Alert and oriented x3 with no focal deficits Lungs diminished at bases but otherwise clear Incisions clean dry and intact Cardiac regular rate and rhythm  Diagnostic Tests: I personally reviewed his chest x-ray.  Shows postoperative changes.  Improved aeration at the bases compared to his last film prior to discharge.  Impression: Mitchell Baxter is a 70 year old man with a  40-pack-year history of tobacco abuse, COPD, hypertension, hyperlipidemia migraines, sleep apnea, vertigo, reflux, and depression.  He was found to have a lung nodule on a low-dose screening CT back in 2020.  Over time that increased in size and developed a solid component.  I did a right lower lobe superior segmentectomy on 01/31/2021.  The nodule turned out to be a T1, N0, stage Ia adenocarcinoma.  His early postoperative course was uncomplicated and he went home on day 3.  He had a fair amount of pain in the hospital and has continued to have pain since discharge.  He has been using tramadol for that and has been ineffective.  We will discontinue that medication.  I given a prescription for oxycodone 5 mg p.o. every 6 hours as needed for pain.  30 tablets no refills.  He can use 2 pills if the pain is particularly severe.  There seems to be a bit of a neuropathic component as well.  I am can increase his gabapentin from 300 mg nightly to 300 mg twice daily.  I encouraged him to continue to walk and use his incentive spirometer.  He should not lift anything over 10 pounds or drive or return to work until his pain improves.  His wife says he has been having a lot of wheezing.  I gave him a prescription for Combivent inhaler.  He may use that as needed.  He needs a referral to oncology.  I will refer him to Dr. Julien Nordmann.  He should not need any adjuvant therapy.   Plan:  Increase gabapentin to 300 mg twice daily DC tramadol Oxycodone 5 mg p.o. every 6 hours as needed for pain.  For particular severe pain may use 2 tablets. Return in 1 month with PA and lateral chest x-ray  Melrose Nakayama, MD [facility] [phone]

## 2021-02-15 ENCOUNTER — Telehealth: Payer: Self-pay | Admitting: *Deleted

## 2021-02-15 DIAGNOSIS — Z9889 Other specified postprocedural states: Secondary | ICD-10-CM

## 2021-02-15 NOTE — Telephone Encounter (Signed)
I received a referral on Mr. Gell today.  I called and update on appt with Dr. Julien Nordmann.

## 2021-02-28 ENCOUNTER — Inpatient Hospital Stay (HOSPITAL_BASED_OUTPATIENT_CLINIC_OR_DEPARTMENT_OTHER): Payer: Medicare HMO | Admitting: *Deleted

## 2021-02-28 ENCOUNTER — Inpatient Hospital Stay: Payer: Medicare HMO

## 2021-02-28 ENCOUNTER — Other Ambulatory Visit: Payer: Self-pay

## 2021-02-28 ENCOUNTER — Inpatient Hospital Stay: Payer: Medicare HMO | Attending: Internal Medicine | Admitting: Internal Medicine

## 2021-02-28 ENCOUNTER — Encounter: Payer: Self-pay | Admitting: Emergency Medicine

## 2021-02-28 VITALS — BP 132/80 | HR 81 | Temp 98.5°F | Resp 20 | Ht 72.0 in | Wt 246.9 lb

## 2021-02-28 DIAGNOSIS — Z809 Family history of malignant neoplasm, unspecified: Secondary | ICD-10-CM

## 2021-02-28 DIAGNOSIS — C3490 Malignant neoplasm of unspecified part of unspecified bronchus or lung: Secondary | ICD-10-CM

## 2021-02-28 DIAGNOSIS — Z818 Family history of other mental and behavioral disorders: Secondary | ICD-10-CM

## 2021-02-28 DIAGNOSIS — Z9889 Other specified postprocedural states: Secondary | ICD-10-CM

## 2021-02-28 DIAGNOSIS — R0602 Shortness of breath: Secondary | ICD-10-CM | POA: Diagnosis not present

## 2021-02-28 DIAGNOSIS — F17210 Nicotine dependence, cigarettes, uncomplicated: Secondary | ICD-10-CM | POA: Insufficient documentation

## 2021-02-28 DIAGNOSIS — Z79899 Other long term (current) drug therapy: Secondary | ICD-10-CM | POA: Diagnosis not present

## 2021-02-28 DIAGNOSIS — I10 Essential (primary) hypertension: Secondary | ICD-10-CM | POA: Diagnosis not present

## 2021-02-28 DIAGNOSIS — C3431 Malignant neoplasm of lower lobe, right bronchus or lung: Secondary | ICD-10-CM

## 2021-02-28 DIAGNOSIS — J90 Pleural effusion, not elsewhere classified: Secondary | ICD-10-CM | POA: Insufficient documentation

## 2021-02-28 DIAGNOSIS — E785 Hyperlipidemia, unspecified: Secondary | ICD-10-CM | POA: Diagnosis not present

## 2021-02-28 DIAGNOSIS — G43909 Migraine, unspecified, not intractable, without status migrainosus: Secondary | ICD-10-CM | POA: Insufficient documentation

## 2021-02-28 DIAGNOSIS — J449 Chronic obstructive pulmonary disease, unspecified: Secondary | ICD-10-CM | POA: Insufficient documentation

## 2021-02-28 DIAGNOSIS — I119 Hypertensive heart disease without heart failure: Secondary | ICD-10-CM | POA: Diagnosis not present

## 2021-02-28 DIAGNOSIS — G4730 Sleep apnea, unspecified: Secondary | ICD-10-CM | POA: Diagnosis not present

## 2021-02-28 DIAGNOSIS — F32A Depression, unspecified: Secondary | ICD-10-CM | POA: Diagnosis not present

## 2021-02-28 DIAGNOSIS — C3491 Malignant neoplasm of unspecified part of right bronchus or lung: Secondary | ICD-10-CM | POA: Insufficient documentation

## 2021-02-28 DIAGNOSIS — K219 Gastro-esophageal reflux disease without esophagitis: Secondary | ICD-10-CM | POA: Insufficient documentation

## 2021-02-28 DIAGNOSIS — Z800 Family history of malignant neoplasm of digestive organs: Secondary | ICD-10-CM | POA: Insufficient documentation

## 2021-02-28 DIAGNOSIS — F0390 Unspecified dementia without behavioral disturbance: Secondary | ICD-10-CM | POA: Diagnosis not present

## 2021-02-28 DIAGNOSIS — R059 Cough, unspecified: Secondary | ICD-10-CM

## 2021-02-28 LAB — CBC WITH DIFFERENTIAL (CANCER CENTER ONLY)
Abs Immature Granulocytes: 0.01 10*3/uL (ref 0.00–0.07)
Basophils Absolute: 0.1 10*3/uL (ref 0.0–0.1)
Basophils Relative: 1 %
Eosinophils Absolute: 0.3 10*3/uL (ref 0.0–0.5)
Eosinophils Relative: 4 %
HCT: 41.2 % (ref 39.0–52.0)
Hemoglobin: 14.2 g/dL (ref 13.0–17.0)
Immature Granulocytes: 0 %
Lymphocytes Relative: 30 %
Lymphs Abs: 2.4 10*3/uL (ref 0.7–4.0)
MCH: 31.8 pg (ref 26.0–34.0)
MCHC: 34.5 g/dL (ref 30.0–36.0)
MCV: 92.2 fL (ref 80.0–100.0)
Monocytes Absolute: 1 10*3/uL (ref 0.1–1.0)
Monocytes Relative: 12 %
Neutro Abs: 4.2 10*3/uL (ref 1.7–7.7)
Neutrophils Relative %: 53 %
Platelet Count: 207 10*3/uL (ref 150–400)
RBC: 4.47 MIL/uL (ref 4.22–5.81)
RDW: 12.9 % (ref 11.5–15.5)
WBC Count: 7.9 10*3/uL (ref 4.0–10.5)
nRBC: 0 % (ref 0.0–0.2)

## 2021-02-28 LAB — BASIC METABOLIC PANEL - CANCER CENTER ONLY
Anion gap: 8 (ref 5–15)
BUN: 12 mg/dL (ref 8–23)
CO2: 25 mmol/L (ref 22–32)
Calcium: 8.7 mg/dL — ABNORMAL LOW (ref 8.9–10.3)
Chloride: 106 mmol/L (ref 98–111)
Creatinine: 1.23 mg/dL (ref 0.61–1.24)
GFR, Estimated: >60 mL/min (ref >60)
Glucose, Bld: 95 mg/dL (ref 70–99)
Potassium: 4.2 mmol/L (ref 3.5–5.1)
Sodium: 139 mmol/L (ref 135–145)

## 2021-02-28 NOTE — Progress Notes (Signed)
I spoke to Mr. Mitchell Baxter today at his first visit with Dr. Julien Nordmann. His treatment plan is to have follow up with Dr. Julien Nordmann and a scan in six months. I reviewed with patient. I gave him information on his dx and plan of care. Mr. Mitchell Baxter stopped smoking 6 weeks ago. I encouraged him to not smoke and gave him resource information form the [facility].

## 2021-02-28 NOTE — Progress Notes (Signed)
[place] Telephone:([phone]) [phone]   Fax:(336) [phone]  CONSULT NOTE  REFERRING PHYSICIAN: Dr. Modesto Charon  REASON FOR CONSULTATION:  70 years old white male recently diagnosed with lung cancer.  HPI Mitchell Baxter is a 70 y.o. male with past medical history significant for COPD, depression, GERD, hypertension, dyslipidemia, sleep apnea, heart murmur as well as migraine and vertigo.  The patient was seen by his primary care physician and had CT screening of the lung performed on September 12, 2019 and that showed solid pulmonary nodules and groundglass nodule in the superior segment right lower lobe measuring 1.3 to centimeters.  Repeat CT scan of the chest on December 30, 2020 showed enlarging right lower lobe groundglass nodule with a new solid internal component measuring 0.8 cm.  The total size of the nodule was 1.77 cm.  There was additional pulmonary nodules measuring 4.5 mm or less in size.  A PET scan on January 27, 2021 showed mild FDG uptake associated with the recently categorized enlarging part solid nodule with SUV max equal to 1.7 cm.  The degree of uptake is considered nonspecific but may be consistent with indolent neoplasm such pulmonary adenocarcinoma.  There was no signs of FDG avid thoracic lymph nodes or FDG avid metastatic disease.  The patient was referred to Dr. Roxan Hockey and on 01/31/2021 he underwent robotic assisted right thoracoscopy with right lower lobe superior segmentectomy and lymph node dissection under the care of Dr. Roxan Hockey.  The final pathology ([id number]) showed invasive adenocarcinoma, well differentiated, 2.0 cm with negative resection margin and no evidence for lymphovascular or pleural invasion. The patient was referred to me today for evaluation and recommendation regarding his condition When seen today the patient is feeling fine today with no concerning complaints except for the soreness on the right side of the chest and  swelling in that area.  He also has mild cough but no significant shortness of breath or hemoptysis.  He denied having any nausea, vomiting, diarrhea or constipation.  He denied having any weight loss or night sweats.  He has chronic migraine headache but no visual changes. Family history significant for mother who is still alive and has dementia.  Father had pancreatic cancer and 2 brother had cancer. The patient is married and has 1 living son.  His other son died from cancer at age 21.  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  The patient had a history of smoking up to 2 pack/day for around 50 years and quit 6 weeks ago.  He has no history of alcohol or drug abuse.  HPI  Past Medical History:  Diagnosis Date  . Chronic migraine w/o aura, not intractable, w stat migr   . COPD (chronic obstructive pulmonary disease) ([place])   . Depression   . GERD (gastroesophageal reflux disease)   . Headache(784.0)    migraine  . Heart murmur    very mild AS 01/26/21 echo  . Hyperlipidemia   . Hypertension   . Shortness of breath    exertion  . Sleep apnea    stopbang=4  . Vertigo     Past Surgical History:  Procedure Laterality Date  . BRAVO [place] STUDY  11/29/2011   Procedure: BRAVO [place];  Surgeon: Landry Dyke, MD;  Location: [facility];  Service: Endoscopy;  Laterality: N/A;  . COLONOSCOPY  10/03/2012   Procedure: COLONOSCOPY;  Surgeon: Shann Medal, MD;  Location: [facility];  Service:  General;  Laterality: N/A;  carol/leone  . HIATAL HERNIA REPAIR  04/16/2012   Procedure: LAPAROSCOPIC REPAIR OF HIATAL HERNIA;  Surgeon: Pedro Earls, MD;  Location: [facility];  Service: General;;  . INTERCOSTAL NERVE BLOCK Right 01/31/2021   Procedure: INTERCOSTAL NERVE BLOCK;  Surgeon: Melrose Nakayama, MD;  Location: [facility];  Service: Thoracic;  Laterality: Right;  . INTRAVASCULAR PRESSURE WIRE/FFR STUDY N/A 11/14/2019   Procedure: INTRAVASCULAR PRESSURE WIRE/FFR STUDY;   Surgeon: Nigel Mormon, MD;  Location: [facility];  Service: Cardiovascular;  Laterality: N/A;  . LAPAROSCOPIC NISSEN FUNDOPLICATION  0/71/2197   Procedure: LAPAROSCOPIC NISSEN FUNDOPLICATION;  Surgeon: Pedro Earls, MD;  Location: [facility];  Service: General;  Laterality: N/A;  . LEFT HEART CATH AND CORONARY ANGIOGRAPHY N/A 11/14/2019   Procedure: LEFT HEART CATH AND CORONARY ANGIOGRAPHY;  Surgeon: Nigel Mormon, MD;  Location: [facility];  Service: Cardiovascular;  Laterality: N/A;  . NODE DISSECTION Right 01/31/2021   Procedure: NODE DISSECTION;  Surgeon: Melrose Nakayama, MD;  Location: [facility];  Service: Thoracic;  Laterality: Right;  . none    . XI ROBOTIC ASSISTED THORACOSCOPY- SEGMENTECTOMY Right 01/31/2021   Procedure: XI ROBOTIC ASSISTED THORACOSCOPY-RIGHT LOWER LOBE SUPERIOR SEGMENTECTOMY;  Surgeon: Melrose Nakayama, MD;  Location: [facility];  Service: Thoracic;  Laterality: Right;    Family History  Problem Relation Age of Onset  . Colon cancer Father        pancreatic  . Anesthesia problems Neg Hx   . Hypotension Neg Hx   . Malignant hyperthermia Neg Hx   . Pseudochol deficiency Neg Hx   . Migraines Neg Hx     Social History Social History   Tobacco Use  . Smoking status: Current Every Day Smoker    Packs/day: 1.00    Years: 40.00    Pack years: 40.00    Types: Cigarettes  . Smokeless tobacco: Never Used  Substance Use Topics  . Alcohol use: Not Currently    Comment: occasional  . Drug use: No    No Known Allergies  Current Outpatient Medications  Medication Sig Dispense Refill  . acetaminophen (TYLENOL) 500 MG tablet Take 2 tablets (1,000 mg total) by mouth every 6 (six) hours. 30 tablet 0  . aspirin EC 81 MG tablet Take 1 tablet (81 mg total) by mouth daily. (Patient taking differently: Take 81 mg by mouth at bedtime.) 30 tablet 3  . atorvastatin (LIPITOR) 40 MG tablet Take 40 mg by mouth at bedtime.     . Cholecalciferol  (VITAMIN D3) 125 MCG (5000 UT) CAPS Take 5,000 Units by mouth daily.    Marland Kitchen FLUoxetine (PROZAC) 40 MG capsule Take 40 mg by mouth every morning.    . fluticasone (FLONASE) 50 MCG/ACT nasal spray Place 1 spray into both nostrils daily for 10 days. 1 mL 0  . gabapentin (NEURONTIN) 300 MG capsule Take 300 mg by mouth in the morning and at bedtime.  6  . Ipratropium-Albuterol (COMBIVENT) 20-100 MCG/ACT AERS respimat Inhale 1 puff into the lungs every 6 (six) hours as needed for wheezing or shortness of breath. 2 g 0  . ketoconazole (NIZORAL) 2 % shampoo Apply 1 application topically 2 (two) times a week.    Marland Kitchen LINZESS 290 MCG CAPS capsule Take 290 mcg by mouth daily as needed for constipation.    Marland Kitchen MELATONIN PO Take 1 tablet by mouth at bedtime.    Marland Kitchen omeprazole (PRILOSEC) 20 MG capsule Take  20 mg by mouth daily.    Marland Kitchen oxyCODONE (OXY IR/ROXICODONE) 5 MG immediate release tablet Take 1 tablet (5 mg total) by mouth every 6 (six) hours as needed for severe pain. 30 tablet 0  . rizatriptan (MAXALT-MLT) 10 MG disintegrating tablet Take 1 tablet (10 mg total) by mouth as needed for migraine. May repeat in 2 hours if needed. Max 2x a day 9 tablet 11  . traZODone (DESYREL) 50 MG tablet Take 50 mg by mouth at bedtime as needed for sleep.    . valsartan-hydrochlorothiazide (DIOVAN-HCT) 160-12.5 MG tablet Take 0.5 tablets by mouth at bedtime.      No current facility-administered medications for this visit.    Review of Systems  Constitutional: positive for fatigue Eyes: negative Ears, nose, mouth, throat, and face: negative Respiratory: positive for cough and pleurisy/chest pain Cardiovascular: negative Gastrointestinal: negative Genitourinary:negative Integument/breast: negative Hematologic/lymphatic: negative Musculoskeletal:negative Neurological: negative Behavioral/Psych: negative Endocrine: negative Allergic/Immunologic: negative  Physical Exam  JME:QASTM, healthy, no distress, well nourished  and well developed SKIN: skin color, texture, turgor are normal, no rashes or significant lesions HEAD: Normocephalic, No masses, lesions, tenderness or abnormalities EYES: normal, PERRLA, Conjunctiva are pink and non-injected EARS: External ears normal, Canals clear OROPHARYNX:no exudate, no erythema and lips, buccal mucosa, and tongue normal  NECK: supple, no adenopathy, no JVD LYMPH:  no palpable lymphadenopathy, no hepatosplenomegaly LUNGS: clear to auscultation , and palpation HEART: regular rate & rhythm, no murmurs and no gallops ABDOMEN:abdomen soft, non-tender, normal bowel sounds and no masses or organomegaly BACK: No CVA tenderness, Range of motion is normal EXTREMITIES:no joint deformities, effusion, or inflammation, no edema  NEURO: alert & oriented x 3 with fluent speech, no focal motor/sensory deficits  PERFORMANCE STATUS: ECOG 1  LABORATORY DATA: Lab Results  Component Value Date   WBC 11.2 (H) 02/02/2021   HGB 15.2 02/02/2021   HCT 45.4 02/02/2021   MCV 95.4 02/02/2021   PLT 203 02/02/2021      Chemistry      Component Value Date/Time   NA 137 02/02/2021 0658   NA 144 06/18/2019 0950   K 3.5 02/02/2021 0658   CL 101 02/02/2021 0658   CO2 27 02/02/2021 0658   BUN 17 02/02/2021 0658   BUN 9 06/18/2019 0950   CREATININE 1.24 02/02/2021 0658      Component Value Date/Time   CALCIUM 8.8 (L) 02/02/2021 0658   ALKPHOS 35 (L) 02/02/2021 0658   AST 23 02/02/2021 0658   ALT 17 02/02/2021 0658   BILITOT 0.9 02/02/2021 0658   BILITOT 0.5 06/18/2019 0950       RADIOGRAPHIC STUDIES: DG Chest 2 View  Result Date: 02/14/2021 CLINICAL DATA:  Recent right lower lobe superior segmentectomy. EXAM: CHEST - 2 VIEW COMPARISON:  02/03/2021 FINDINGS: The cardiac silhouette, mediastinal and hilar contours are within normal limits and stable. Expected surgical changes in the right lower lobe with some streaky areas of surrounding scarring or atelectasis. No pulmonary edema,  pleural effusions or pneumothorax. The bony thorax is intact. IMPRESSION: No acute cardiopulmonary findings. Expected postoperative changes in the right lower lobe. Electronically Signed   By: Marijo Sanes M.D.   On: 02/14/2021 16:17   DG Chest 2 View  Result Date: 02/02/2021 CLINICAL DATA:  Shortness of breath.  Prior chest tube removal. EXAM: CHEST - 2 VIEW COMPARISON:  02/01/2021. FINDINGS: Mediastinum and hilar structures normal. Heart size stable. Postsurgical changes right lung. Lung volumes with persistent bibasilar atelectasis. Small right pleural effusion, unchanged. No  pneumothorax. Mild right chest wall subcutaneous emphysema. No acute bony abnormality identified. IMPRESSION: 1. Postsurgical changes right lung. Low lung volumes with mild bibasilar atelectasis. Small right pleural effusion, unchanged. 2.  No pneumothorax.  Mild right chest wall subcutaneous emphysema. Electronically Signed   By: Marcello Moores  Register   On: 02/02/2021 07:00   DG Chest Port 1 View  Result Date: 02/03/2021 CLINICAL DATA:  Shortness of breath.  Cough. EXAM: PORTABLE CHEST 1 VIEW COMPARISON:  02/02/2021. FINDINGS: Mediastinum and hilar structures normal. Heart size normal. Surgical sutures noted over the right lung base. Low lung volumes with mild bibasilar atelectasis. Small bilateral pleural effusions. No pneumothorax. IMPRESSION: Low lung volumes with mild bibasilar atelectasis/infiltrates. Electronically Signed   By: Marcello Moores  Register   On: 02/03/2021 07:35   DG CHEST PORT 1 VIEW  Result Date: 02/01/2021 CLINICAL DATA:  Shortness of breath. History of partial right lower lobe lobectomy. EXAM: PORTABLE CHEST 1 VIEW COMPARISON:  Earlier film, same date. FINDINGS: The cardiac silhouette, mediastinal and hilar contours are within normal limits and stable. Stable postoperative changes involving the right lower lung. Low lung volumes with vascular crowding and streaky bibasilar atelectasis. No pleural effusions or  pneumothorax. IMPRESSION: Low lung volumes with vascular crowding and streaky bibasilar atelectasis. Electronically Signed   By: Marijo Sanes M.D.   On: 02/01/2021 20:58   DG Chest Port 1 View  Result Date: 02/01/2021 CLINICAL DATA:  Chest tube removal EXAM: PORTABLE CHEST 1 VIEW COMPARISON:  02/01/2021 FINDINGS: Interval removal of right chest tube. Low lung volumes, bibasilar atelectasis. No pneumothorax. Heart is borderline in size. IMPRESSION: Interval removal of right chest tube without pneumothorax. Stable bibasilar atelectasis. Electronically Signed   By: Rolm Baptise M.D.   On: 02/01/2021 17:37   DG CHEST PORT 1 VIEW  Result Date: 02/01/2021 CLINICAL DATA:  Chest tube.  Partial lobectomy. EXAM: PORTABLE CHEST 1 VIEW COMPARISON:  01/31/2021. FINDINGS: Chest tube noted stable position. No pneumothorax. Postsurgical changes right lung. Mild atelectasis right lung base. Tiny bilateral pleural effusions cannot be excluded. Stable cardiomegaly. Mild right chest wall subcutaneous emphysema again noted. IMPRESSION: 1. Right chest tube in stable position. No pneumothorax. Right chest wall subcutaneous emphysema again noted. 2. Postsurgical changes right lung. Mild atelectasis right lung base. Tiny bilateral pleural effusions cannot be excluded. 3.  Stable cardiomegaly. Electronically Signed   By: Marcello Moores  Register   On: 02/01/2021 06:53   DG Chest Port 1 View  Result Date: 01/31/2021 CLINICAL DATA:  Postoperative lobectomy EXAM: PORTABLE CHEST 1 VIEW COMPARISON:  January 27, 2021. FINDINGS: Chest tube present on the right without pneumothorax. A small amount of subcutaneous air noted on the right. No edema or airspace opacity. Heart is upper normal in size with pulmonary vascularity normal. No adenopathy. There are foci of calcification in each carotid artery. IMPRESSION: Chest tube on the right without evident pneumothorax. No edema or airspace opacity. Heart upper normal in size. Foci of carotid artery  calcification bilaterally noted. Electronically Signed   By: Lowella Grip III M.D.   On: 01/31/2021 13:46    ASSESSMENT: This is a very pleasant 70 years old white male recently diagnosed with a stage Ia (T1b, N0, M0) non-small cell lung cancer, well differentiated adenocarcinoma measuring 2.0 cm status post right lower lobe superior segmentectomy with lymph node dissection under the care of Dr. Roxan Hockey on 01/31/2021.   PLAN: I had a lengthy discussion with the patient today about his current disease stage, prognosis and treatment options. I explained  to the patient that he has very early stage of the lung cancer and there is no survival benefit for adjuvant systemic chemotherapy or radiation to a patient with a stage Ia. I explained to the patient that the 5-year survival for patient with a stage I is around 80% and the current standard of care is observation and close monitoring. I recommended for him to come back for follow-up visit in 6 months for evaluation and repeat CT scan of the chest. For the smoking cessation we encouraged the patient to continue quitting smoking and he received education from the thoracic navigator today. The patient was advised to call immediately if he has any other concerning symptoms in the interval. The patient voices understanding of current disease status and treatment options and is in agreement with the current care plan.  All questions were answered. The patient knows to call the clinic with any problems, questions or concerns. We can certainly see the patient much sooner if necessary.  Thank you so much for allowing me to participate in the care of Mitchell Baxter. I will continue to follow up the patient with you and assist in his care.  The total time spent in the appointment was 60 minutes.  Disclaimer: This note was dictated with voice recognition software. Similar sounding words can inadvertently be transcribed and may not be corrected upon  review.   Eilleen Kempf Feb 28, 2021, 1:32 PM

## 2021-02-28 NOTE — Research (Unsigned)
[id number] - Customer service manager for the Discovery and Validation of Biomarkers for the Prediction, Diagnosis, and Management of Disease  This Nurse has reviewed this patient's inclusion and exclusion criteria as a second review and confirms Mitchell Baxter is eligible for study participation.  Patient may continue with enrollment.  Dionne Bucy. Sharlett Iles, BSN, RN, CIC 02/28/2021 8:56 AM

## 2021-02-28 NOTE — Research (Signed)
[id number] - Customer service manager for the Discovery and Validation of Biomarkers for the Prediction, Diagnosis, and Management of Disease  02/28/21  Patient Mitchell Baxter was identified by this Research officer, political party as a potential candidate for the above listed study.  This Clinical Research Coordinator met with AARIC DOLPH, [id number], on 02/28/21 in a manner and location that ensures patient privacy to discuss participation in the above listed research study.  Patient is Unaccompanied.  A copy of the informed consent document and separate HIPAA Authorization was provided to the patient.  Patient reads, speaks, and understands [place].    Patient was provided with the business card of this Coordinator and encouraged to contact the research team with any questions.  Approximately 10 minutes were spent with the patient reviewing the informed consent documents.  Patient was provided the option of taking informed consent documents home to review and was encouraged to review at their convenience with their support network, including other care providers. Patient took the consent documents home to review.   Will call the patient in one week to check interest in this study.  Clabe Seal Clinical Research Coordinator I  02/28/21 2:09 PM

## 2021-03-22 ENCOUNTER — Ambulatory Visit: Payer: Self-pay | Admitting: Thoracic Surgery (Cardiothoracic Vascular Surgery)

## 2021-03-23 ENCOUNTER — Other Ambulatory Visit: Payer: Self-pay | Admitting: Thoracic Surgery (Cardiothoracic Vascular Surgery)

## 2021-03-23 DIAGNOSIS — C3491 Malignant neoplasm of unspecified part of right bronchus or lung: Secondary | ICD-10-CM

## 2021-03-29 ENCOUNTER — Ambulatory Visit
Admission: RE | Admit: 2021-03-29 | Discharge: 2021-03-29 | Disposition: A | Discharge status: Home / Self Care | Payer: Medicare HMO | Source: Ambulatory Visit | Attending: Thoracic Surgery (Cardiothoracic Vascular Surgery) | Admitting: Thoracic Surgery (Cardiothoracic Vascular Surgery)

## 2021-03-29 ENCOUNTER — Ambulatory Visit (INDEPENDENT_AMBULATORY_CARE_PROVIDER_SITE_OTHER): Payer: Self-pay | Admitting: Thoracic Surgery (Cardiothoracic Vascular Surgery)

## 2021-03-29 ENCOUNTER — Other Ambulatory Visit: Payer: Self-pay

## 2021-03-29 VITALS — BP 129/77 | HR 70 | Resp 20 | Ht 72.0 in | Wt 244.0 lb

## 2021-03-29 DIAGNOSIS — C3491 Malignant neoplasm of unspecified part of right bronchus or lung: Secondary | ICD-10-CM

## 2021-03-29 DIAGNOSIS — Z9889 Other specified postprocedural states: Secondary | ICD-10-CM

## 2021-03-29 NOTE — Progress Notes (Signed)
[address]       [place] [address]             [phone]     HPI: Mr. Vallecillo returns for scheduled postoperative follow-up visit  Mitchell Baxter is a 70 year old man with a past history of tobacco abuse, COPD, hypertension, hyperlipidemia, migraines, sleep apnea, vertigo, reflux, and depression.  He had a 40-pack-year history of smoking which was ongoing up to the time of his surgery.  He was found to have a groundglass opacity in the superior segment of his right lower lobe in November 2020.  That was found on a low-dose screening CT.  Over time that increased in size and developed a solid component.  I did a right lower lobe superior segmentectomy on 01/31/2021.  The nodule turned out to be a stage Ia (T1, N0) adenocarcinoma.  He did well postoperatively and went home on day 3.  I saw him back in the office on 02/14/2021.  He is having a lot of incisional pain at that time.  He was on gabapentin 300 mg nightly.  Tramadol was not helping.  I gave him a prescription for oxycodone and also increased his gabapentin to 300 mg twice daily.  In the interim since his last visit his pain is essentially resolved.  He no longer is taking any oxycodone.  He has self decreased his gabapentin back to once a day.  He has occasional wheezing.  He is using his inhaler about once a day.  He did smoke a pack of cigarettes over the weekend.  Past Medical History:  Diagnosis Date  . Chronic migraine w/o aura, not intractable, w stat migr   . COPD (chronic obstructive pulmonary disease) ([place])   . Depression   . GERD (gastroesophageal reflux disease)   . Headache(784.0)    migraine  . Heart murmur    very mild AS 01/26/21 echo  . Hyperlipidemia   . Hypertension   . Shortness of breath    exertion  . Sleep apnea    stopbang=4  . Vertigo     Current Outpatient Medications  Medication Sig Dispense Refill  . acetaminophen (TYLENOL) 500 MG tablet Take 2 tablets (1,000 mg total)  by mouth every 6 (six) hours. 30 tablet 0  . aspirin EC 81 MG tablet Take 1 tablet (81 mg total) by mouth daily. (Patient taking differently: Take 81 mg by mouth at bedtime.) 30 tablet 3  . atorvastatin (LIPITOR) 40 MG tablet Take 40 mg by mouth at bedtime.     . Cholecalciferol (VITAMIN D3) 125 MCG (5000 UT) CAPS Take 5,000 Units by mouth daily.    Marland Kitchen FLUoxetine (PROZAC) 40 MG capsule Take 40 mg by mouth every morning.    . gabapentin (NEURONTIN) 300 MG capsule Take 300 mg by mouth in the morning and at bedtime.  6  . Ipratropium-Albuterol (COMBIVENT) 20-100 MCG/ACT AERS respimat Inhale 1 puff into the lungs every 6 (six) hours as needed for wheezing or shortness of breath. 2 g 0  . ketoconazole (NIZORAL) 2 % shampoo Apply 1 application topically 2 (two) times a week.    Marland Kitchen LINZESS 290 MCG CAPS capsule Take 290 mcg by mouth daily as needed for constipation.    Marland Kitchen MELATONIN PO Take 1 tablet by mouth at bedtime.    Marland Kitchen omeprazole (PRILOSEC) 20 MG capsule Take 20 mg by mouth daily.    Marland Kitchen oxyCODONE (OXY IR/ROXICODONE) 5 MG immediate release tablet Take  1 tablet (5 mg total) by mouth every 6 (six) hours as needed for severe pain. 30 tablet 0  . rizatriptan (MAXALT-MLT) 10 MG disintegrating tablet Take 1 tablet (10 mg total) by mouth as needed for migraine. May repeat in 2 hours if needed. Max 2x a day 9 tablet 11  . traZODone (DESYREL) 50 MG tablet Take 50 mg by mouth at bedtime as needed for sleep.    . valsartan-hydrochlorothiazide (DIOVAN-HCT) 160-12.5 MG tablet Take 0.5 tablets by mouth at bedtime.     . fluticasone (FLONASE) 50 MCG/ACT nasal spray Place 1 spray into both nostrils daily for 10 days. 1 mL 0   No current facility-administered medications for this visit.    Physical Exam BP 129/77   Pulse 70   Resp 20   Ht 6' (1.829 m)   Wt 244 lb (110.7 kg)   SpO2 93% Comment: RA  BMI 33.31 kg/m  70 year old man in no acute distress Alert and oriented x3 with no focal deficits Lungs faint  wheeze at bases Incisions well-healed Cardiac regular rate and rhythm No peripheral edema  Diagnostic Tests: CHEST - 2 VIEW  COMPARISON:  Chest x-ray 02/14/2021.  CT 02/14/2021.  FINDINGS: Mediastinum and hilar structures normal. Heart size stable. Prominent nipple shadows again noted. Mild atelectatic and or postsurgical changes right lung base again noted. No acute abnormality identified. No pleural effusion or pneumothorax. No acute bony abnormality.  IMPRESSION: Mild atelectatic and or postsurgical changes right lung base again noted. No acute abnormality identified.   Electronically Signed   By: Marcello Moores  Register   On: 03/29/2021 13:24 I personally reviewed the chest x-ray images and concur with the findings noted above  Impression: Mitchell Baxter is a 70 year old man with a past history of tobacco abuse, COPD, hypertension, hyperlipidemia, migraines, sleep apnea, vertigo, reflux, and depression.   Stage Ia adenocarcinoma-groundglass opacity that eventually developed a solid component.  He underwent right lower lobe superior segmentectomy and node dissection on 01/31/2021.  Early postoperative course was unremarkable he went home on day 3.  For the first couple of weeks he was having a lot of pain.  That has improved significantly.  At this point he is doing well.  There are no restrictions on his activities.  He saw Dr. Julien Nordmann.  No adjuvant therapy is indicated.  He will plan to follow-up with in 6 months with a CT scan.  Tobacco abuse-he had not smoked for over a month since his surgery.  He did's [employer] pack of cigarettes over the weekend.  He says he can tell a difference with his breathing just from that.  Emphasized the importance of abstinence both in regards to his lung cancer and his overall health.  Plan: Follow-up as scheduled with Dr. Julien Nordmann in November Return in 6 months with PA lateral chest x-ray to check on progress  Melrose Nakayama, MD [facility] [phone]

## 2021-07-19 DIAGNOSIS — H524 Presbyopia: Secondary | ICD-10-CM | POA: Diagnosis not present

## 2021-07-19 DIAGNOSIS — Z135 Encounter for screening for eye and ear disorders: Secondary | ICD-10-CM | POA: Diagnosis not present

## 2021-07-19 DIAGNOSIS — H259 Unspecified age-related cataract: Secondary | ICD-10-CM | POA: Diagnosis not present

## 2021-08-02 DIAGNOSIS — H2511 Age-related nuclear cataract, right eye: Secondary | ICD-10-CM | POA: Diagnosis not present

## 2021-08-02 DIAGNOSIS — Z01818 Encounter for other preprocedural examination: Secondary | ICD-10-CM | POA: Diagnosis not present

## 2021-08-02 DIAGNOSIS — H2512 Age-related nuclear cataract, left eye: Secondary | ICD-10-CM | POA: Diagnosis not present

## 2021-08-11 DIAGNOSIS — H2511 Age-related nuclear cataract, right eye: Secondary | ICD-10-CM | POA: Diagnosis not present

## 2021-08-25 DIAGNOSIS — H2512 Age-related nuclear cataract, left eye: Secondary | ICD-10-CM | POA: Diagnosis not present

## 2021-08-29 ENCOUNTER — Inpatient Hospital Stay: Payer: Medicare HMO

## 2021-08-30 ENCOUNTER — Other Ambulatory Visit: Payer: Self-pay

## 2021-08-30 ENCOUNTER — Ambulatory Visit (HOSPITAL_COMMUNITY)
Admission: RE | Admit: 2021-08-30 | Discharge: 2021-08-30 | Disposition: A | Discharge status: Home / Self Care | Payer: Medicare HMO | Source: Ambulatory Visit | Attending: Internal Medicine | Admitting: Internal Medicine

## 2021-08-30 ENCOUNTER — Inpatient Hospital Stay: Payer: Medicare HMO | Attending: Internal Medicine

## 2021-08-30 DIAGNOSIS — I700 Atherosclerosis of aorta: Secondary | ICD-10-CM | POA: Insufficient documentation

## 2021-08-30 DIAGNOSIS — C3431 Malignant neoplasm of lower lobe, right bronchus or lung: Secondary | ICD-10-CM | POA: Insufficient documentation

## 2021-08-30 DIAGNOSIS — C3490 Malignant neoplasm of unspecified part of unspecified bronchus or lung: Secondary | ICD-10-CM | POA: Diagnosis not present

## 2021-08-30 DIAGNOSIS — Z79899 Other long term (current) drug therapy: Secondary | ICD-10-CM | POA: Diagnosis not present

## 2021-08-30 DIAGNOSIS — I10 Essential (primary) hypertension: Secondary | ICD-10-CM | POA: Insufficient documentation

## 2021-08-30 DIAGNOSIS — C3491 Malignant neoplasm of unspecified part of right bronchus or lung: Secondary | ICD-10-CM

## 2021-08-30 LAB — CBC WITH DIFFERENTIAL (CANCER CENTER ONLY)
Abs Immature Granulocytes: 0.02 10*3/uL (ref 0.00–0.07)
Basophils Absolute: 0.1 10*3/uL (ref 0.0–0.1)
Basophils Relative: 1 %
Eosinophils Absolute: 0.2 10*3/uL (ref 0.0–0.5)
Eosinophils Relative: 2 %
HCT: 43.9 % (ref 39.0–52.0)
Hemoglobin: 15 g/dL (ref 13.0–17.0)
Immature Granulocytes: 0 %
Lymphocytes Relative: 34 %
Lymphs Abs: 2.7 10*3/uL (ref 0.7–4.0)
MCH: 31.8 pg (ref 26.0–34.0)
MCHC: 34.2 g/dL (ref 30.0–36.0)
MCV: 93.2 fL (ref 80.0–100.0)
Monocytes Absolute: 0.7 10*3/uL (ref 0.1–1.0)
Monocytes Relative: 8 %
Neutro Abs: 4.4 10*3/uL (ref 1.7–7.7)
Neutrophils Relative %: 55 %
Platelet Count: 225 10*3/uL (ref 150–400)
RBC: 4.71 MIL/uL (ref 4.22–5.81)
RDW: 12.9 % (ref 11.5–15.5)
WBC Count: 8.1 10*3/uL (ref 4.0–10.5)
nRBC: 0 % (ref 0.0–0.2)

## 2021-08-30 LAB — CMP (CANCER CENTER ONLY)
ALT: 15 U/L (ref 0–44)
AST: 15 U/L (ref 15–41)
Albumin: 3.6 g/dL (ref 3.5–5.0)
Alkaline Phosphatase: 59 U/L (ref 38–126)
Anion gap: 10 (ref 5–15)
BUN: 13 mg/dL (ref 8–23)
CO2: 22 mmol/L (ref 22–32)
Calcium: 8.8 mg/dL — ABNORMAL LOW (ref 8.9–10.3)
Chloride: 106 mmol/L (ref 98–111)
Creatinine: 1.06 mg/dL (ref 0.61–1.24)
GFR, Estimated: >60 mL/min (ref >60)
Glucose, Bld: 95 mg/dL (ref 70–99)
Potassium: 3.9 mmol/L (ref 3.5–5.1)
Sodium: 138 mmol/L (ref 135–145)
Total Bilirubin: 0.5 mg/dL (ref 0.3–1.2)
Total Protein: 6.6 g/dL (ref 6.5–8.1)

## 2021-08-30 LAB — POCT I-STAT CREATININE: Creatinine, Ser: 1.2 mg/dL (ref 0.61–1.24)

## 2021-08-30 MED ORDER — IOHEXOL 350 MG/ML SOLN
80.0000 mL | Freq: Once | INTRAVENOUS | Status: AC | PRN
  Administered 2021-08-30: 60 mL via INTRAVENOUS

## 2021-08-31 ENCOUNTER — Inpatient Hospital Stay: Payer: Medicare HMO | Admitting: Internal Medicine

## 2021-08-31 VITALS — BP 147/84 | HR 65 | Temp 97.4°F | Resp 20 | Ht 72.0 in | Wt 243.2 lb

## 2021-08-31 DIAGNOSIS — C3490 Malignant neoplasm of unspecified part of unspecified bronchus or lung: Secondary | ICD-10-CM | POA: Diagnosis not present

## 2021-08-31 DIAGNOSIS — C3431 Malignant neoplasm of lower lobe, right bronchus or lung: Secondary | ICD-10-CM | POA: Diagnosis not present

## 2021-08-31 DIAGNOSIS — Z79899 Other long term (current) drug therapy: Secondary | ICD-10-CM | POA: Diagnosis not present

## 2021-08-31 DIAGNOSIS — I10 Essential (primary) hypertension: Secondary | ICD-10-CM | POA: Diagnosis not present

## 2021-08-31 DIAGNOSIS — I700 Atherosclerosis of aorta: Secondary | ICD-10-CM | POA: Diagnosis not present

## 2021-08-31 NOTE — Progress Notes (Signed)
[place] [phone]   [phone]  OFFICE PROGRESS NOTE  [address], [place] [address]  DIAGNOSIS: stage IA (T1b, N0, M0) non-small cell lung cancer, well differentiated adenocarcinoma measuring 2.0 cm   PRIOR THERAPY: Status post right lower lobe superior segmentectomy with lymph node dissection under the care of Dr. Roxan Hockey on 01/31/2021.  CURRENT THERAPY: Observation  INTERVAL HISTORY: Mitchell Baxter 70 y.o. male returns to the clinic today for follow-up visit.  The patient is feeling fine today with no concerning complaints.  He denied having any current chest pain, shortness of breath, cough or hemoptysis.  He denied having any fever or chills.  He has no nausea, vomiting, diarrhea or constipation.  He has no headache or visual changes.  He is here today for evaluation with repeat CT scan of the chest for restaging of his disease.  MEDICAL HISTORY: Past Medical History:  Diagnosis Date   Chronic migraine w/o aura, not intractable, w stat migr    COPD (chronic obstructive pulmonary disease) (HCC)    Depression    GERD (gastroesophageal reflux disease)    Headache(784.0)    migraine   Heart murmur    very mild AS 01/26/21 echo   Hyperlipidemia    Hypertension    Shortness of breath    exertion   Sleep apnea    stopbang=4   Vertigo     ALLERGIES:  has No Known Allergies.  MEDICATIONS:  Current Outpatient Medications  Medication Sig Dispense Refill   acetaminophen (TYLENOL) 500 MG tablet Take 2 tablets (1,000 mg total) by mouth every 6 (six) hours. 30 tablet 0   aspirin EC 81 MG tablet Take 1 tablet (81 mg total) by mouth daily. (Patient taking differently: Take 81 mg by mouth at bedtime.) 30 tablet 3   atorvastatin (LIPITOR) 40 MG tablet Take 40 mg by mouth at bedtime.      Cholecalciferol (VITAMIN D3) 125 MCG (5000 UT) CAPS Take 5,000 Units by mouth daily.     FLUoxetine  (PROZAC) 40 MG capsule Take 40 mg by mouth every morning.     fluticasone (FLONASE) 50 MCG/ACT nasal spray Place 1 spray into both nostrils daily for 10 days. 1 mL 0   gabapentin (NEURONTIN) 300 MG capsule Take 300 mg by mouth in the morning and at bedtime.  6   Ipratropium-Albuterol (COMBIVENT) 20-100 MCG/ACT AERS respimat Inhale 1 puff into the lungs every 6 (six) hours as needed for wheezing or shortness of breath. 2 g 0   ketoconazole (NIZORAL) 2 % shampoo Apply 1 application topically 2 (two) times a week.     LINZESS 290 MCG CAPS capsule Take 290 mcg by mouth daily as needed for constipation.     MELATONIN PO Take 1 tablet by mouth at bedtime.     omeprazole (PRILOSEC) 20 MG capsule Take 20 mg by mouth daily.     oxyCODONE (OXY IR/ROXICODONE) 5 MG immediate release tablet Take 1 tablet (5 mg total) by mouth every 6 (six) hours as needed for severe pain. 30 tablet 0   rizatriptan (MAXALT-MLT) 10 MG disintegrating tablet Take 1 tablet (10 mg total) by mouth as needed for migraine. May repeat in 2 hours if needed. Max 2x a day 9 tablet 11   traZODone (DESYREL) 50 MG tablet Take 50 mg by mouth at bedtime as needed for sleep.     valsartan-hydrochlorothiazide (DIOVAN-HCT) 160-12.5 MG tablet Take  0.5 tablets by mouth at bedtime.      No current facility-administered medications for this visit.    SURGICAL HISTORY:  Past Surgical History:  Procedure Laterality Date   BRAVO [place] STUDY  11/29/2011   Procedure: BRAVO [place];  Surgeon: Landry Dyke, MD;  Location: [facility];  Service: Endoscopy;  Laterality: N/A;   COLONOSCOPY  10/03/2012   Procedure: COLONOSCOPY;  Surgeon: Shann Medal, MD;  Location: [facility];  Service: General;  Laterality: N/A;  carol/leone   HIATAL HERNIA REPAIR  04/16/2012   Procedure: LAPAROSCOPIC REPAIR OF HIATAL HERNIA;  Surgeon: Pedro Earls, MD;  Location: [facility];  Service: General;;   INTERCOSTAL NERVE BLOCK Right 01/31/2021   Procedure: INTERCOSTAL NERVE  BLOCK;  Surgeon: Melrose Nakayama, MD;  Location: [facility];  Service: Thoracic;  Laterality: Right;   INTRAVASCULAR PRESSURE WIRE/FFR STUDY N/A 11/14/2019   Procedure: INTRAVASCULAR PRESSURE WIRE/FFR STUDY;  Surgeon: Nigel Mormon, MD;  Location: [facility];  Service: Cardiovascular;  Laterality: N/A;   LAPAROSCOPIC NISSEN FUNDOPLICATION  11/01/7251   Procedure: LAPAROSCOPIC NISSEN FUNDOPLICATION;  Surgeon: Pedro Earls, MD;  Location: [facility];  Service: General;  Laterality: N/A;   LEFT HEART CATH AND CORONARY ANGIOGRAPHY N/A 11/14/2019   Procedure: LEFT HEART CATH AND CORONARY ANGIOGRAPHY;  Surgeon: Nigel Mormon, MD;  Location: [facility];  Service: Cardiovascular;  Laterality: N/A;   NODE DISSECTION Right 01/31/2021   Procedure: NODE DISSECTION;  Surgeon: Melrose Nakayama, MD;  Location: [facility];  Service: Thoracic;  Laterality: Right;   none     XI ROBOTIC ASSISTED THORACOSCOPY- SEGMENTECTOMY Right 01/31/2021   Procedure: XI ROBOTIC ASSISTED THORACOSCOPY-RIGHT LOWER LOBE SUPERIOR SEGMENTECTOMY;  Surgeon: Melrose Nakayama, MD;  Location: [facility];  Service: Thoracic;  Laterality: Right;    REVIEW OF SYSTEMS:  A comprehensive review of systems was negative.   PHYSICAL EXAMINATION: General appearance: alert, cooperative, and no distress Head: Normocephalic, without obvious abnormality, atraumatic Neck: no adenopathy, no JVD, supple, symmetrical, trachea midline, and thyroid not enlarged, symmetric, no tenderness/mass/nodules Lymph nodes: Cervical, supraclavicular, and axillary nodes normal. Resp: clear to auscultation bilaterally Back: symmetric, no curvature. ROM normal. No CVA tenderness. Cardio: regular rate and rhythm, S1, S2 normal, no murmur, click, rub or gallop GI: soft, non-tender; bowel sounds normal; no masses,  no organomegaly Extremities: extremities normal, atraumatic, no cyanosis or edema  ECOG PERFORMANCE STATUS: 1 - Symptomatic but  completely ambulatory  Blood pressure (!) 147/84, pulse 65, temperature (!) 97.4 F (36.3 C), temperature source Tympanic, resp. rate 20, height 6' (1.829 m), weight 243 lb 3.2 oz (110.3 kg), SpO2 97 %.  LABORATORY DATA: Lab Results  Component Value Date   WBC 8.1 08/30/2021   HGB 15.0 08/30/2021   HCT 43.9 08/30/2021   MCV 93.2 08/30/2021   PLT 225 08/30/2021      Chemistry      Component Value Date/Time   NA 138 08/30/2021 1522   NA 144 06/18/2019 0950   K 3.9 08/30/2021 1522   CL 106 08/30/2021 1522   CO2 22 08/30/2021 1522   BUN 13 08/30/2021 1522   BUN 9 06/18/2019 0950   CREATININE 1.20 08/30/2021 1710   CREATININE 1.06 08/30/2021 1522      Component Value Date/Time   CALCIUM 8.8 (L) 08/30/2021 1522   ALKPHOS 59 08/30/2021 1522   AST 15 08/30/2021 1522   ALT 15 08/30/2021 1522   BILITOT 0.5 08/30/2021 1522  RADIOGRAPHIC STUDIES: CT Chest W Contrast  Result Date: 08/31/2021 CLINICAL DATA:  Primary Cancer Type: Lung Imaging Indication: Assess response to therapy Interval therapy since last imaging?  Yes, resection Initial Cancer Diagnosis Date: 01/31/2021; Established by: Biopsy-proven Detailed Pathology: Stage IA non-small cell lung cancer, well differentiated adenocarcinoma. Primary Tumor location: Right lower lobe. Surgeries: Right lower lobe superior segmentectomy 01/31/2021. Nissen fundoplication 2426. Chemotherapy: No Immunotherapy? No Radiation therapy? No EXAM: CT CHEST WITH CONTRAST TECHNIQUE: Multidetector CT imaging of the chest was performed during intravenous contrast administration. CONTRAST:  50mL OMNIPAQUE IOHEXOL 350 MG/ML SOLN COMPARISON:  01/27/2021 PET-CT. FINDINGS: Cardiovascular: No acute findings. Aortic and coronary atherosclerotic calcification noted. Mediastinum/Nodes: No masses or pathologically enlarged lymph nodes identified. Stable sub-cm mediastinal lymph nodes in the right paratracheal region, which showed no FDG uptake on prior  PET-CT. Lungs/Pleura: Postop changes are seen from right lower lobe segmentectomy. No suspicious nodules or masses identified. No evidence of pulmonary infiltrate or pleural effusion. Upper Abdomen:  Unremarkable. Musculoskeletal:  No suspicious bone lesions. IMPRESSION: Postop changes from right lower lobe segmentectomy. No evidence of recurrent or metastatic carcinoma within the thorax. Aortic Atherosclerosis (ICD10-I70.0). Electronically Signed   By: Marlaine Hind M.D.   On: 08/31/2021 11:38    ASSESSMENT AND PLAN: This is a very pleasant 70 years old white male diagnosed with a stage Ia (T1b, N0, M0) non-small cell lung cancer, well differentiated adenocarcinoma measuring 2.0 cm in April 2022 status post right lower lobe superior segmentectomy with lymph node dissection under the care of Dr. Roxan Hockey. The patient is currently on observation and he is feeling fine today with no concerning complaints. He had repeat CT scan of the chest performed recently.  I personally and independently reviewed the scan before the final report became available and I did not see any concerning findings for disease recurrence or metastasis. I recommended for the patient to continue on observation with repeat CT scan of the chest in 6 months. He was advised to call immediately if he has any other concerning symptoms in the interval. The patient voices understanding of current disease status and treatment options and is in agreement with the current care plan.  All questions were answered. The patient knows to call the clinic with any problems, questions or concerns. We can certainly see the patient much sooner if necessary.  The total time spent in the appointment was 20 minutes.  Disclaimer: This note was dictated with voice recognition software. Similar sounding words can inadvertently be transcribed and may not be corrected upon review.

## 2021-09-02 ENCOUNTER — Telehealth: Payer: Self-pay | Admitting: Internal Medicine

## 2021-09-02 NOTE — Telephone Encounter (Signed)
Left message with follow-up appointments per 11/2 los.

## 2021-09-21 DIAGNOSIS — E559 Vitamin D deficiency, unspecified: Secondary | ICD-10-CM | POA: Diagnosis not present

## 2021-09-21 DIAGNOSIS — R7303 Prediabetes: Secondary | ICD-10-CM | POA: Diagnosis not present

## 2021-09-21 DIAGNOSIS — E7800 Pure hypercholesterolemia, unspecified: Secondary | ICD-10-CM | POA: Diagnosis not present

## 2021-09-21 DIAGNOSIS — G4733 Obstructive sleep apnea (adult) (pediatric): Secondary | ICD-10-CM | POA: Diagnosis not present

## 2021-09-26 ENCOUNTER — Other Ambulatory Visit: Payer: Self-pay | Admitting: Thoracic Surgery (Cardiothoracic Vascular Surgery)

## 2021-09-26 DIAGNOSIS — C3491 Malignant neoplasm of unspecified part of right bronchus or lung: Secondary | ICD-10-CM

## 2021-09-27 ENCOUNTER — Ambulatory Visit: Payer: Medicare HMO | Admitting: Thoracic Surgery (Cardiothoracic Vascular Surgery)

## 2021-09-28 DIAGNOSIS — I10 Essential (primary) hypertension: Secondary | ICD-10-CM | POA: Diagnosis not present

## 2021-09-28 DIAGNOSIS — L219 Seborrheic dermatitis, unspecified: Secondary | ICD-10-CM | POA: Diagnosis not present

## 2021-09-28 DIAGNOSIS — Z902 Acquired absence of lung [part of]: Secondary | ICD-10-CM | POA: Diagnosis not present

## 2021-09-28 DIAGNOSIS — R7303 Prediabetes: Secondary | ICD-10-CM | POA: Diagnosis not present

## 2021-09-28 DIAGNOSIS — E7800 Pure hypercholesterolemia, unspecified: Secondary | ICD-10-CM | POA: Diagnosis not present

## 2021-09-28 DIAGNOSIS — E559 Vitamin D deficiency, unspecified: Secondary | ICD-10-CM | POA: Diagnosis not present

## 2021-09-28 DIAGNOSIS — I700 Atherosclerosis of aorta: Secondary | ICD-10-CM | POA: Diagnosis not present

## 2021-09-28 DIAGNOSIS — G4733 Obstructive sleep apnea (adult) (pediatric): Secondary | ICD-10-CM | POA: Diagnosis not present

## 2021-09-28 DIAGNOSIS — Z23 Encounter for immunization: Secondary | ICD-10-CM | POA: Diagnosis not present

## 2021-09-28 DIAGNOSIS — C3490 Malignant neoplasm of unspecified part of unspecified bronchus or lung: Secondary | ICD-10-CM | POA: Diagnosis not present

## 2021-09-28 DIAGNOSIS — I2510 Atherosclerotic heart disease of native coronary artery without angina pectoris: Secondary | ICD-10-CM | POA: Diagnosis not present

## 2021-10-21 DIAGNOSIS — G4733 Obstructive sleep apnea (adult) (pediatric): Secondary | ICD-10-CM | POA: Diagnosis not present

## 2021-11-08 ENCOUNTER — Encounter: Payer: Medicare HMO | Admitting: Thoracic Surgery (Cardiothoracic Vascular Surgery)

## 2021-11-10 ENCOUNTER — Other Ambulatory Visit: Payer: Self-pay

## 2021-11-10 ENCOUNTER — Ambulatory Visit: Payer: Medicare HMO | Admitting: Thoracic Surgery (Cardiothoracic Vascular Surgery)

## 2021-11-10 ENCOUNTER — Ambulatory Visit
Admission: RE | Admit: 2021-11-10 | Discharge: 2021-11-10 | Disposition: A | Discharge status: Home / Self Care | Payer: Medicare HMO | Source: Ambulatory Visit | Attending: Thoracic Surgery (Cardiothoracic Vascular Surgery) | Admitting: Thoracic Surgery (Cardiothoracic Vascular Surgery)

## 2021-11-10 ENCOUNTER — Encounter: Payer: Self-pay | Admitting: Thoracic Surgery (Cardiothoracic Vascular Surgery)

## 2021-11-10 VITALS — BP 135/79 | HR 79 | Resp 20 | Ht 72.0 in | Wt 242.4 lb

## 2021-11-10 DIAGNOSIS — Z9889 Other specified postprocedural states: Secondary | ICD-10-CM

## 2021-11-10 DIAGNOSIS — C3491 Malignant neoplasm of unspecified part of right bronchus or lung: Secondary | ICD-10-CM

## 2021-11-10 NOTE — Progress Notes (Signed)
[address]       [place] [address]             [phone]     HPI: Mitchell Baxter returns for a scheduled follow-up visit after his previous superior segmentectomy for an early stage lung cancer.  Mitchell Baxter is a 71 year old man with a history of tobacco abuse, COPD, hypertension, hyperlipidemia, migraines, sleep apnea, vertigo, reflux, and depression.  He had a groundglass opacity found on a low-dose CT for lung cancer screening in November 2020.  Over time it increased in size and developed a solid component.  I did a right lower lobe superior segmentectomy on 01/31/2021.  The nodule was a stage Ia adenocarcinoma (T1, N0).  He initially had a lot of incisional pain.  He was taking gabapentin and narcotics.  He was off those within about 6 to 8 weeks postoperatively.  He saw Dr. Julien Nordmann in November.  There was no evidence of recurrent disease.  Unfortunately he is smoking again.  He is not smoking a lot but is occasionally smoking a pipe and sometimes up to half a dozen cigarettes a day.  He does not have any pain related to his surgery.  Does have occasional shortness of breath.  Past Medical History:  Diagnosis Date   Chronic migraine w/o aura, not intractable, w stat migr    COPD (chronic obstructive pulmonary disease) (HCC)    Depression    GERD (gastroesophageal reflux disease)    Headache(784.0)    migraine   Heart murmur    very mild AS 01/26/21 echo   Hyperlipidemia    Hypertension    Shortness of breath    exertion   Sleep apnea    stopbang=4   Vertigo     Current Outpatient Medications  Medication Sig Dispense Refill   acetaminophen (TYLENOL) 500 MG tablet Take 2 tablets (1,000 mg total) by mouth every 6 (six) hours. 30 tablet 0   aspirin EC 81 MG tablet Take 1 tablet (81 mg total) by mouth daily. (Patient taking differently: Take 81 mg by mouth at bedtime.) 30 tablet 3   atorvastatin (LIPITOR) 40 MG tablet Take 40 mg by mouth at bedtime.       FLUoxetine (PROZAC) 40 MG capsule Take 40 mg by mouth every morning.     gabapentin (NEURONTIN) 300 MG capsule Take 300 mg by mouth in the morning and at bedtime.  6   Ipratropium-Albuterol (COMBIVENT) 20-100 MCG/ACT AERS respimat Inhale 1 puff into the lungs every 6 (six) hours as needed for wheezing or shortness of breath. 2 g 0   ketoconazole (NIZORAL) 2 % shampoo Apply 1 application topically 2 (two) times a week.     LINZESS 290 MCG CAPS capsule Take 290 mcg by mouth daily as needed for constipation.     MELATONIN PO Take 1 tablet by mouth at bedtime.     omeprazole (PRILOSEC) 20 MG capsule Take 20 mg by mouth daily.     valsartan-hydrochlorothiazide (DIOVAN-HCT) 160-12.5 MG tablet Take 0.5 tablets by mouth at bedtime.      No current facility-administered medications for this visit.    Physical Exam BP 135/79 (BP Location: Right Arm, Patient Position: Sitting, Cuff Size: Large)    Pulse 79    Resp 20    Ht 6' (1.829 m)    Wt 242 lb 6.4 oz (110 kg)    SpO2 96%    BMI 32.31 kg/m  71 year old man in  no acute distress Alert and oriented x3 with no focal deficits Lungs clear with equal breath sounds bilaterally Cardiac regular rate and rhythm Incisions well-healed No cervical or supraclavicular adenopathy  Diagnostic Tests: CHEST - 2 VIEW   COMPARISON:  Chest x-ray 03/29/2021 and chest CT 08/30/2021   FINDINGS: The cardiac silhouette, mediastinal and hilar contours are within normal limits.   Stable surgical changes from a right lower lobe segmentectomy. No findings suspicious for recurrent tumor.   There is a pleural line paralleling the lower right chest wall but there are lung markings beyond this and this is likely postsurgical change and not a pneumothorax.   Bilateral nipple shadows are noted. No worrisome pulmonary lesions. No pleural effusion.   IMPRESSION: Stable postoperative changes.   No acute pulmonary findings.     Electronically Signed   By: Marijo Sanes M.D.   On: 11/10/2021 14:49 I personally reviewed the chest x-ray images.  Also reviewed CT images from November.  No acute changes on either.  Impression: Mitchell Baxter is a 71 year old man with a history of tobacco abuse, COPD, hypertension, hyperlipidemia, migraines, sleep apnea, vertigo, reflux, depression, and a stage Ia non-small cell carcinoma of the right lower lobe status post superior segmentectomy in 2022.  Stage Ia non-small cell carcinoma-not require adjuvant therapy.  No evidence of recurrent disease.  He is being followed by Dr. Julien Nordmann.  Status post right lower lobe superior segmentectomy-no issues related to surgery.  Tobacco abuse-unfortunately he has started using tobacco again.  The importance of cessation was emphasized and he understands but has multiple triggers that encouraged him to smoke.  Plan: Follow-up with Dr. Julien Nordmann I will be happy to see Mr. Tangonan back at any time in the future if I can be of any further assistance with his care.  Melrose Nakayama, MD [facility] [phone]

## 2021-11-21 DIAGNOSIS — G4733 Obstructive sleep apnea (adult) (pediatric): Secondary | ICD-10-CM | POA: Diagnosis not present

## 2021-12-22 DIAGNOSIS — G4733 Obstructive sleep apnea (adult) (pediatric): Secondary | ICD-10-CM | POA: Diagnosis not present

## 2022-01-11 DIAGNOSIS — Z0000 Encounter for general adult medical examination without abnormal findings: Secondary | ICD-10-CM | POA: Diagnosis not present

## 2022-01-11 DIAGNOSIS — E7800 Pure hypercholesterolemia, unspecified: Secondary | ICD-10-CM | POA: Diagnosis not present

## 2022-01-11 DIAGNOSIS — E559 Vitamin D deficiency, unspecified: Secondary | ICD-10-CM | POA: Diagnosis not present

## 2022-01-11 DIAGNOSIS — I10 Essential (primary) hypertension: Secondary | ICD-10-CM | POA: Diagnosis not present

## 2022-01-11 DIAGNOSIS — Z125 Encounter for screening for malignant neoplasm of prostate: Secondary | ICD-10-CM | POA: Diagnosis not present

## 2022-01-11 DIAGNOSIS — F419 Anxiety disorder, unspecified: Secondary | ICD-10-CM | POA: Diagnosis not present

## 2022-01-18 DIAGNOSIS — Z23 Encounter for immunization: Secondary | ICD-10-CM | POA: Diagnosis not present

## 2022-01-18 DIAGNOSIS — I10 Essential (primary) hypertension: Secondary | ICD-10-CM | POA: Diagnosis not present

## 2022-01-18 DIAGNOSIS — G4733 Obstructive sleep apnea (adult) (pediatric): Secondary | ICD-10-CM | POA: Diagnosis not present

## 2022-01-18 DIAGNOSIS — C3490 Malignant neoplasm of unspecified part of unspecified bronchus or lung: Secondary | ICD-10-CM | POA: Diagnosis not present

## 2022-01-18 DIAGNOSIS — I2510 Atherosclerotic heart disease of native coronary artery without angina pectoris: Secondary | ICD-10-CM | POA: Diagnosis not present

## 2022-01-18 DIAGNOSIS — R7301 Impaired fasting glucose: Secondary | ICD-10-CM | POA: Diagnosis not present

## 2022-01-18 DIAGNOSIS — E875 Hyperkalemia: Secondary | ICD-10-CM | POA: Diagnosis not present

## 2022-01-18 DIAGNOSIS — G43919 Migraine, unspecified, intractable, without status migrainosus: Secondary | ICD-10-CM | POA: Diagnosis not present

## 2022-01-18 DIAGNOSIS — E7800 Pure hypercholesterolemia, unspecified: Secondary | ICD-10-CM | POA: Diagnosis not present

## 2022-01-18 DIAGNOSIS — E559 Vitamin D deficiency, unspecified: Secondary | ICD-10-CM | POA: Diagnosis not present

## 2022-01-18 DIAGNOSIS — K5909 Other constipation: Secondary | ICD-10-CM | POA: Diagnosis not present

## 2022-01-18 DIAGNOSIS — R413 Other amnesia: Secondary | ICD-10-CM | POA: Diagnosis not present

## 2022-01-18 DIAGNOSIS — Z0000 Encounter for general adult medical examination without abnormal findings: Secondary | ICD-10-CM | POA: Diagnosis not present

## 2022-01-19 DIAGNOSIS — G4733 Obstructive sleep apnea (adult) (pediatric): Secondary | ICD-10-CM | POA: Diagnosis not present

## 2022-02-19 DIAGNOSIS — G4733 Obstructive sleep apnea (adult) (pediatric): Secondary | ICD-10-CM | POA: Diagnosis not present

## 2022-02-24 ENCOUNTER — Inpatient Hospital Stay: Payer: Medicare HMO | Attending: Internal Medicine

## 2022-02-28 ENCOUNTER — Inpatient Hospital Stay: Payer: Medicare HMO | Attending: Internal Medicine | Admitting: Internal Medicine

## 2022-03-21 DIAGNOSIS — G4733 Obstructive sleep apnea (adult) (pediatric): Secondary | ICD-10-CM | POA: Diagnosis not present

## 2022-03-27 IMAGING — CR DG CHEST 2V
2 series · 2 of 2 positions shown · non-contrast
Comparison: Chest x-ray 03/29/2021 and chest CT 08/30/2021

CLINICAL DATA: Adenocarcinoma of the right lung.  Follow-up.

EXAM:
CHEST - 2 VIEW

[w chest pa]
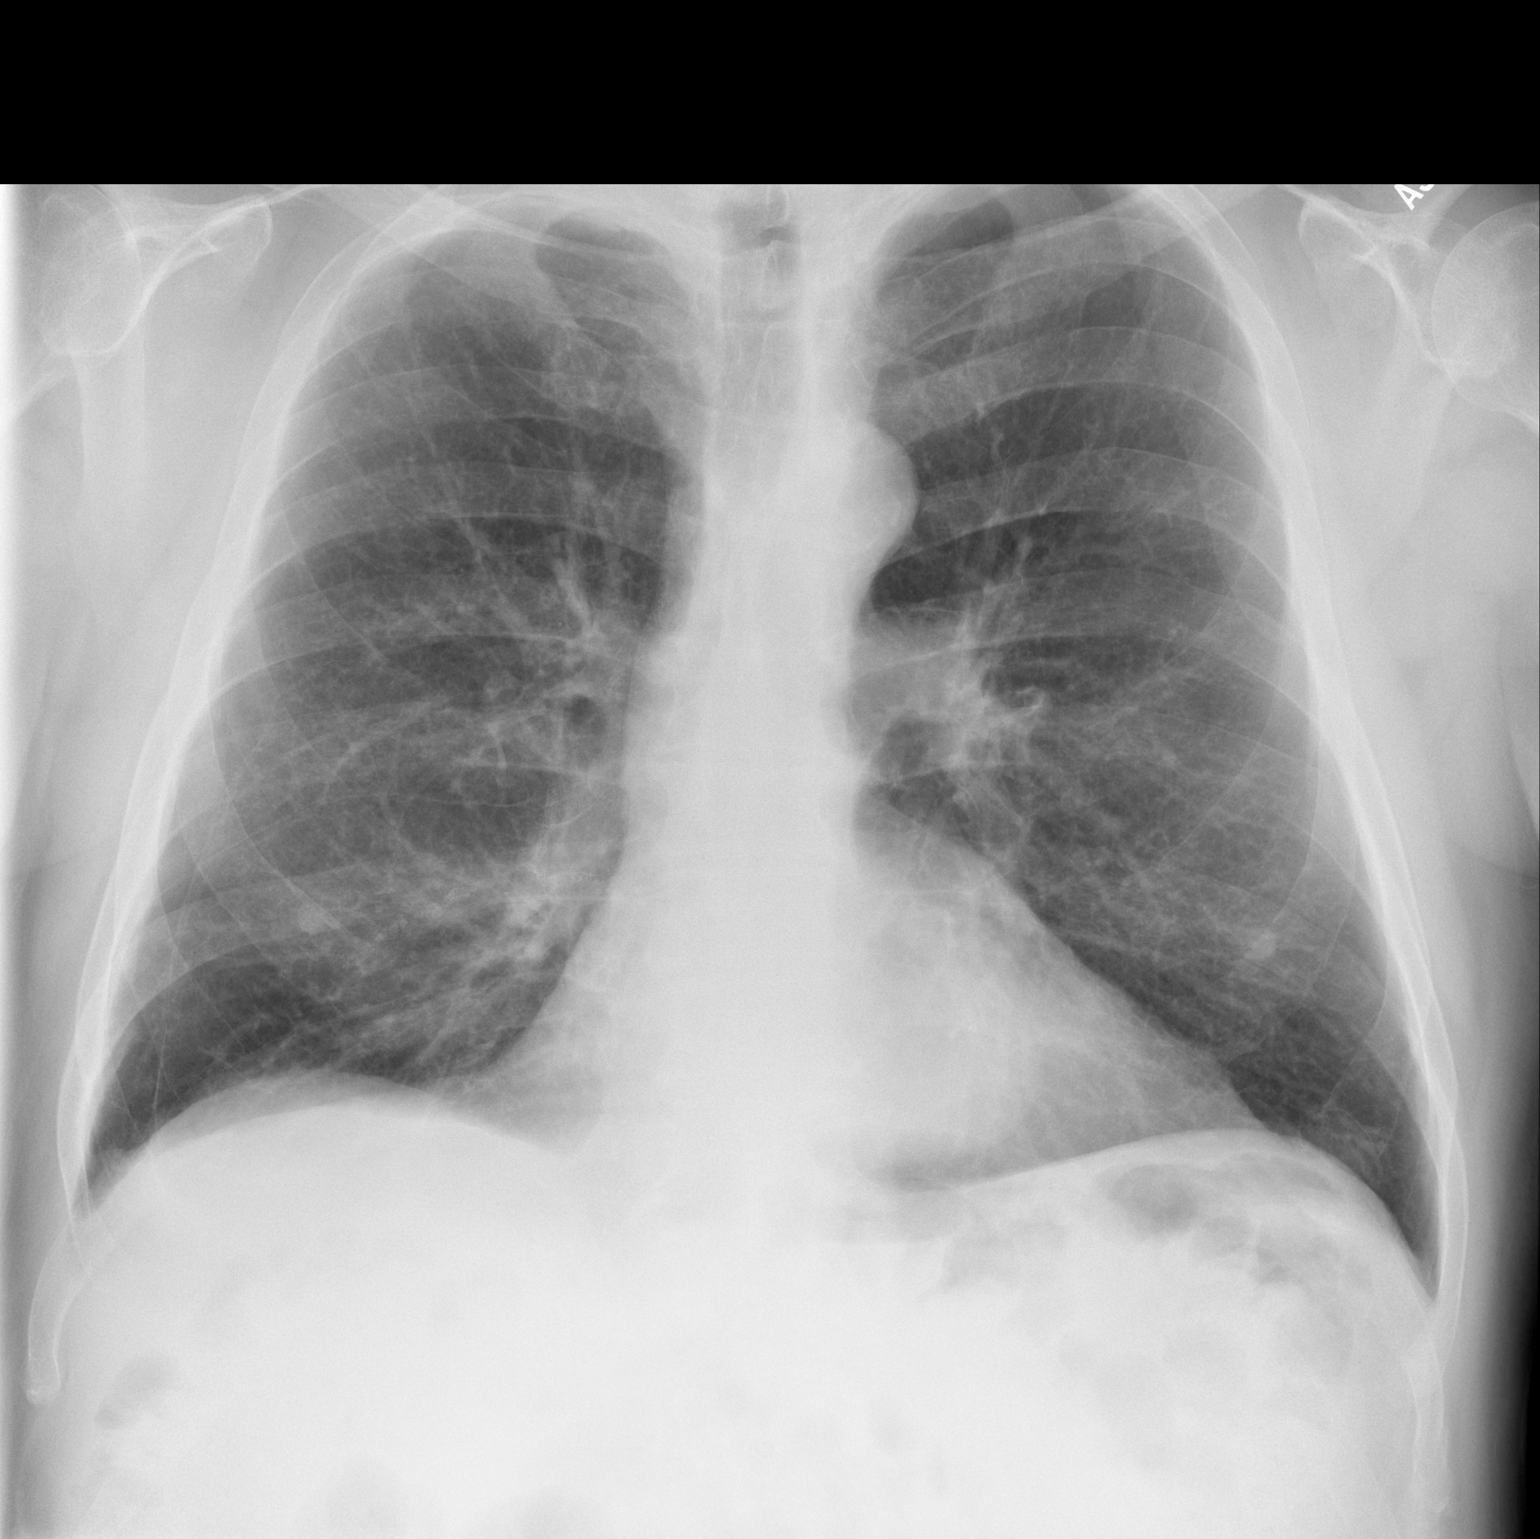

[w chest lat *]
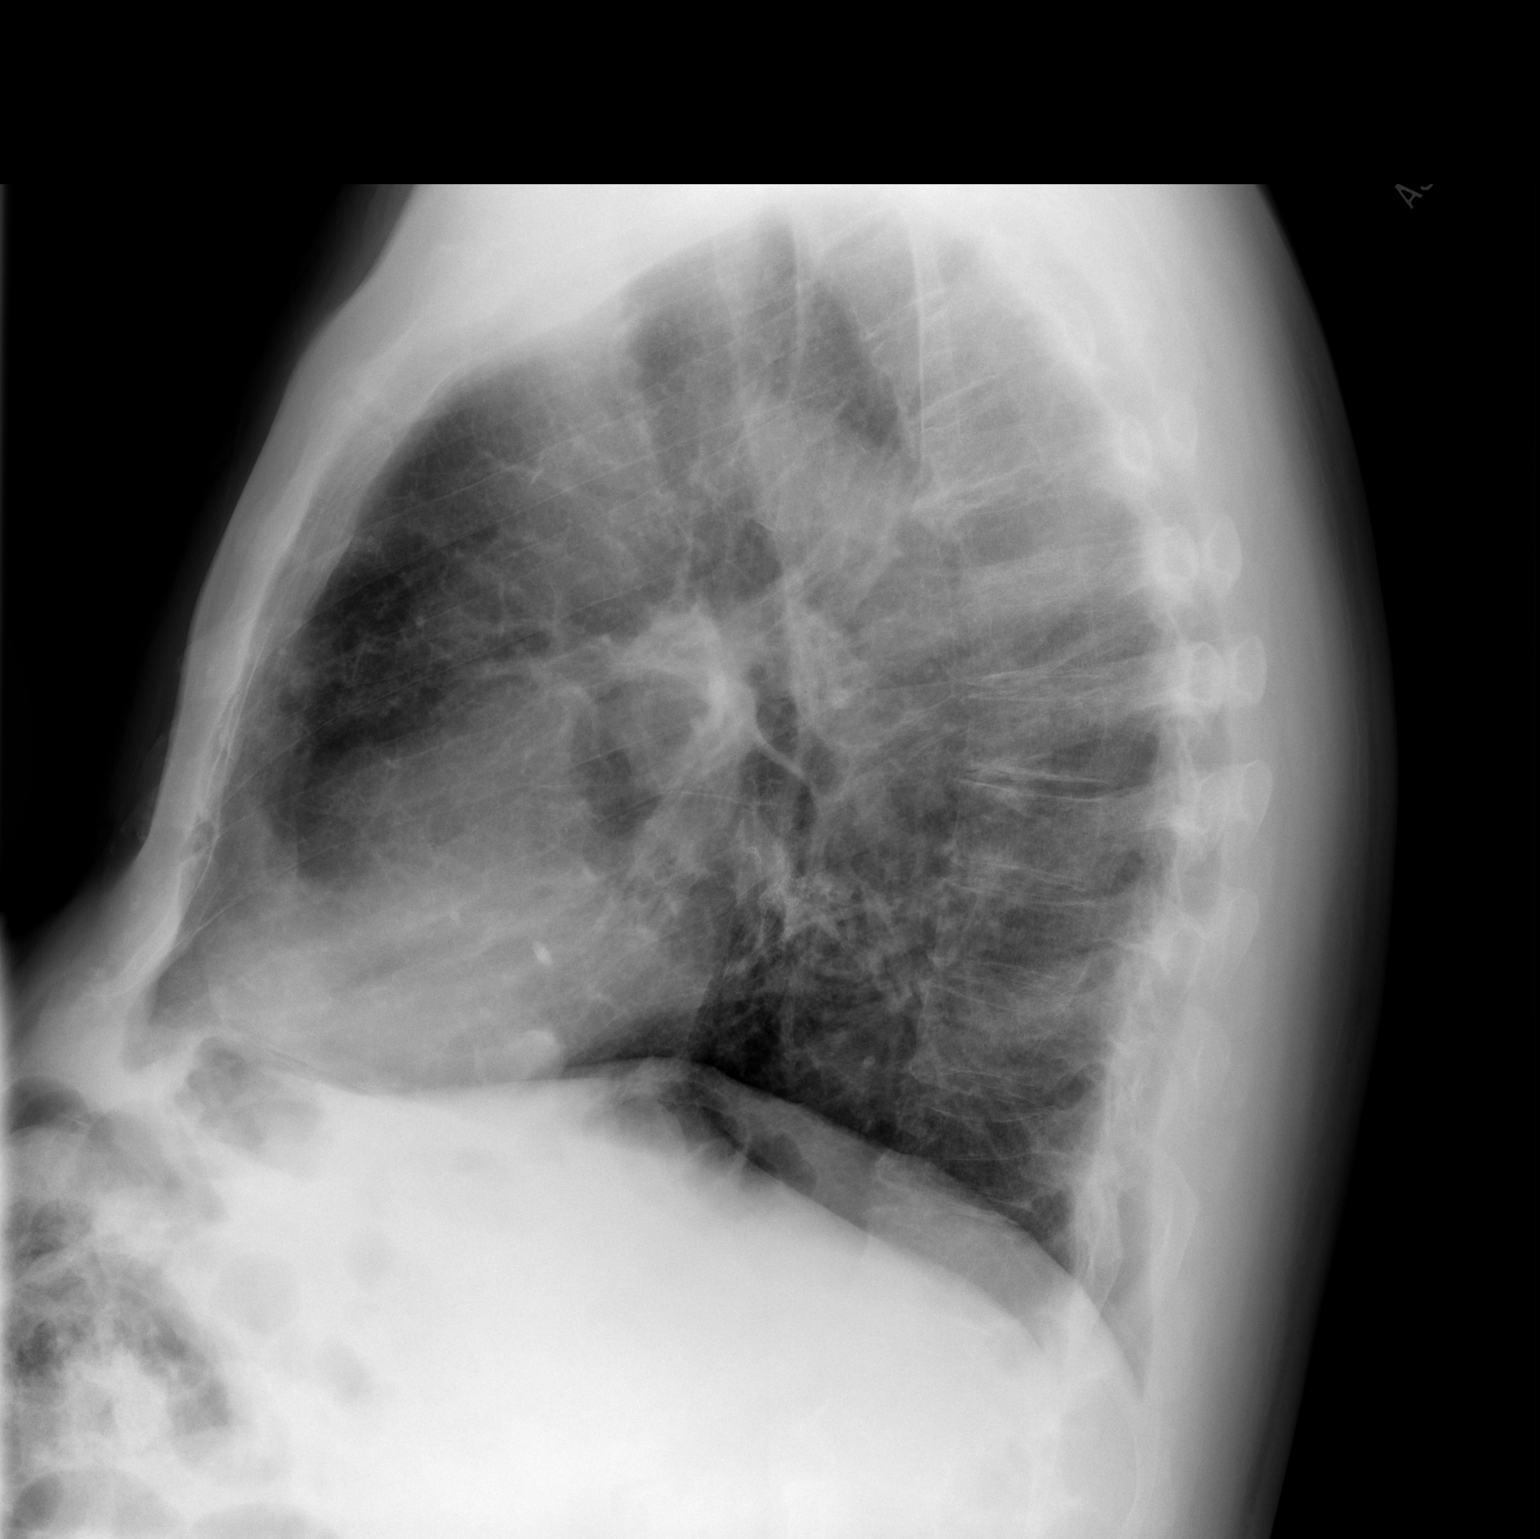

[2 of 2 positions shown; findings below may reference images not displayed]

FINDINGS: The cardiac silhouette, mediastinal and hilar contours are within
normal limits.

Stable surgical changes from a right lower lobe segmentectomy. No
findings suspicious for recurrent tumor.

There is a pleural line paralleling the lower right chest wall but
there are lung markings beyond this and this is likely postsurgical
change and not a pneumothorax.

Bilateral nipple shadows are noted. No worrisome pulmonary lesions.
No pleural effusion.
IMPRESSION: Stable postoperative changes.

No acute pulmonary findings.

## 2022-04-21 DIAGNOSIS — G4733 Obstructive sleep apnea (adult) (pediatric): Secondary | ICD-10-CM | POA: Diagnosis not present

## 2022-05-21 DIAGNOSIS — G4733 Obstructive sleep apnea (adult) (pediatric): Secondary | ICD-10-CM | POA: Diagnosis not present

## 2022-06-13 DIAGNOSIS — R3911 Hesitancy of micturition: Secondary | ICD-10-CM | POA: Diagnosis not present

## 2022-06-13 DIAGNOSIS — L219 Seborrheic dermatitis, unspecified: Secondary | ICD-10-CM | POA: Diagnosis not present

## 2022-06-21 DIAGNOSIS — G4733 Obstructive sleep apnea (adult) (pediatric): Secondary | ICD-10-CM | POA: Diagnosis not present

## 2022-07-20 DIAGNOSIS — R7301 Impaired fasting glucose: Secondary | ICD-10-CM | POA: Diagnosis not present

## 2022-07-20 DIAGNOSIS — E7800 Pure hypercholesterolemia, unspecified: Secondary | ICD-10-CM | POA: Diagnosis not present

## 2022-07-27 DIAGNOSIS — Z902 Acquired absence of lung [part of]: Secondary | ICD-10-CM | POA: Diagnosis not present

## 2022-07-27 DIAGNOSIS — I700 Atherosclerosis of aorta: Secondary | ICD-10-CM | POA: Diagnosis not present

## 2022-07-27 DIAGNOSIS — F17219 Nicotine dependence, cigarettes, with unspecified nicotine-induced disorders: Secondary | ICD-10-CM | POA: Diagnosis not present

## 2022-07-27 DIAGNOSIS — G4733 Obstructive sleep apnea (adult) (pediatric): Secondary | ICD-10-CM | POA: Diagnosis not present

## 2022-07-27 DIAGNOSIS — E7800 Pure hypercholesterolemia, unspecified: Secondary | ICD-10-CM | POA: Diagnosis not present

## 2022-07-27 DIAGNOSIS — C3490 Malignant neoplasm of unspecified part of unspecified bronchus or lung: Secondary | ICD-10-CM | POA: Diagnosis not present

## 2022-07-27 DIAGNOSIS — I10 Essential (primary) hypertension: Secondary | ICD-10-CM | POA: Diagnosis not present

## 2022-07-27 DIAGNOSIS — G43919 Migraine, unspecified, intractable, without status migrainosus: Secondary | ICD-10-CM | POA: Diagnosis not present

## 2022-07-27 DIAGNOSIS — I2510 Atherosclerotic heart disease of native coronary artery without angina pectoris: Secondary | ICD-10-CM | POA: Diagnosis not present

## 2022-08-21 DIAGNOSIS — Z86010 Personal history of colonic polyps: Secondary | ICD-10-CM | POA: Diagnosis not present

## 2022-08-21 DIAGNOSIS — Z800 Family history of malignant neoplasm of digestive organs: Secondary | ICD-10-CM | POA: Diagnosis not present

## 2022-08-21 DIAGNOSIS — K5900 Constipation, unspecified: Secondary | ICD-10-CM | POA: Diagnosis not present

## 2022-09-28 DIAGNOSIS — K635 Polyp of colon: Secondary | ICD-10-CM | POA: Diagnosis not present

## 2022-09-28 DIAGNOSIS — Z86010 Personal history of colonic polyps: Secondary | ICD-10-CM | POA: Diagnosis not present

## 2022-12-01 DIAGNOSIS — G4733 Obstructive sleep apnea (adult) (pediatric): Secondary | ICD-10-CM | POA: Diagnosis not present

## 2023-01-16 DIAGNOSIS — R7303 Prediabetes: Secondary | ICD-10-CM | POA: Diagnosis not present

## 2023-01-16 DIAGNOSIS — Z0000 Encounter for general adult medical examination without abnormal findings: Secondary | ICD-10-CM | POA: Diagnosis not present

## 2023-01-16 DIAGNOSIS — E559 Vitamin D deficiency, unspecified: Secondary | ICD-10-CM | POA: Diagnosis not present

## 2023-01-23 ENCOUNTER — Other Ambulatory Visit: Payer: Self-pay | Admitting: Registered Nurse

## 2023-01-23 DIAGNOSIS — E559 Vitamin D deficiency, unspecified: Secondary | ICD-10-CM | POA: Diagnosis not present

## 2023-01-23 DIAGNOSIS — I2510 Atherosclerotic heart disease of native coronary artery without angina pectoris: Secondary | ICD-10-CM | POA: Diagnosis not present

## 2023-01-23 DIAGNOSIS — I700 Atherosclerosis of aorta: Secondary | ICD-10-CM | POA: Diagnosis not present

## 2023-01-23 DIAGNOSIS — F17219 Nicotine dependence, cigarettes, with unspecified nicotine-induced disorders: Secondary | ICD-10-CM

## 2023-01-23 DIAGNOSIS — I10 Essential (primary) hypertension: Secondary | ICD-10-CM | POA: Diagnosis not present

## 2023-01-23 DIAGNOSIS — E7800 Pure hypercholesterolemia, unspecified: Secondary | ICD-10-CM | POA: Diagnosis not present

## 2023-01-23 DIAGNOSIS — Z0000 Encounter for general adult medical examination without abnormal findings: Secondary | ICD-10-CM | POA: Diagnosis not present

## 2023-01-23 DIAGNOSIS — J45909 Unspecified asthma, uncomplicated: Secondary | ICD-10-CM | POA: Diagnosis not present

## 2023-01-23 DIAGNOSIS — R7303 Prediabetes: Secondary | ICD-10-CM | POA: Diagnosis not present

## 2023-01-23 DIAGNOSIS — G4733 Obstructive sleep apnea (adult) (pediatric): Secondary | ICD-10-CM | POA: Diagnosis not present

## 2023-01-24 ENCOUNTER — Encounter: Payer: Self-pay | Admitting: Cardiology

## 2023-02-22 ENCOUNTER — Ambulatory Visit
Admission: RE | Admit: 2023-02-22 | Discharge: 2023-02-22 | Disposition: A | Discharge status: Home / Self Care | Payer: Medicare HMO | Source: Ambulatory Visit | Attending: Registered Nurse | Admitting: Registered Nurse

## 2023-02-22 DIAGNOSIS — Z122 Encounter for screening for malignant neoplasm of respiratory organs: Secondary | ICD-10-CM | POA: Diagnosis not present

## 2023-02-22 DIAGNOSIS — F17219 Nicotine dependence, cigarettes, with unspecified nicotine-induced disorders: Secondary | ICD-10-CM

## 2023-02-22 DIAGNOSIS — F17200 Nicotine dependence, unspecified, uncomplicated: Secondary | ICD-10-CM | POA: Diagnosis not present

## 2023-07-19 DIAGNOSIS — R7303 Prediabetes: Secondary | ICD-10-CM | POA: Diagnosis not present

## 2023-07-19 DIAGNOSIS — Z125 Encounter for screening for malignant neoplasm of prostate: Secondary | ICD-10-CM | POA: Diagnosis not present

## 2023-07-19 DIAGNOSIS — E7800 Pure hypercholesterolemia, unspecified: Secondary | ICD-10-CM | POA: Diagnosis not present

## 2023-08-01 DIAGNOSIS — D492 Neoplasm of unspecified behavior of bone, soft tissue, and skin: Secondary | ICD-10-CM | POA: Diagnosis not present

## 2023-08-13 DIAGNOSIS — M2550 Pain in unspecified joint: Secondary | ICD-10-CM | POA: Diagnosis not present

## 2023-08-13 DIAGNOSIS — L821 Other seborrheic keratosis: Secondary | ICD-10-CM | POA: Diagnosis not present

## 2023-08-13 DIAGNOSIS — J441 Chronic obstructive pulmonary disease with (acute) exacerbation: Secondary | ICD-10-CM | POA: Diagnosis not present

## 2023-08-28 DIAGNOSIS — M2550 Pain in unspecified joint: Secondary | ICD-10-CM | POA: Diagnosis not present

## 2023-08-28 DIAGNOSIS — M79643 Pain in unspecified hand: Secondary | ICD-10-CM | POA: Diagnosis not present

## 2023-08-28 DIAGNOSIS — R252 Cramp and spasm: Secondary | ICD-10-CM | POA: Diagnosis not present

## 2023-08-28 DIAGNOSIS — M79642 Pain in left hand: Secondary | ICD-10-CM | POA: Diagnosis not present

## 2023-08-28 DIAGNOSIS — Z23 Encounter for immunization: Secondary | ICD-10-CM | POA: Diagnosis not present

## 2023-08-28 DIAGNOSIS — M1990 Unspecified osteoarthritis, unspecified site: Secondary | ICD-10-CM | POA: Diagnosis not present

## 2023-08-28 DIAGNOSIS — M79641 Pain in right hand: Secondary | ICD-10-CM | POA: Diagnosis not present

## 2023-11-08 DIAGNOSIS — M79641 Pain in right hand: Secondary | ICD-10-CM | POA: Diagnosis not present

## 2023-11-08 DIAGNOSIS — M79642 Pain in left hand: Secondary | ICD-10-CM | POA: Diagnosis not present

## 2023-11-26 DIAGNOSIS — M79642 Pain in left hand: Secondary | ICD-10-CM | POA: Diagnosis not present

## 2023-11-26 DIAGNOSIS — M79641 Pain in right hand: Secondary | ICD-10-CM | POA: Diagnosis not present

## 2023-11-26 DIAGNOSIS — G5603 Carpal tunnel syndrome, bilateral upper limbs: Secondary | ICD-10-CM | POA: Diagnosis not present

## 2023-12-09 DIAGNOSIS — G5601 Carpal tunnel syndrome, right upper limb: Secondary | ICD-10-CM | POA: Diagnosis not present

## 2023-12-09 DIAGNOSIS — G5603 Carpal tunnel syndrome, bilateral upper limbs: Secondary | ICD-10-CM | POA: Diagnosis not present

## 2023-12-09 DIAGNOSIS — G5602 Carpal tunnel syndrome, left upper limb: Secondary | ICD-10-CM | POA: Diagnosis not present

## 2023-12-19 DIAGNOSIS — G5603 Carpal tunnel syndrome, bilateral upper limbs: Secondary | ICD-10-CM | POA: Diagnosis not present

## 2024-01-18 LAB — LAB REPORT - SCANNED
A1c: 6
EGFR: 63

## 2024-02-04 ENCOUNTER — Encounter: Payer: Self-pay | Admitting: Registered Nurse

## 2024-02-04 DIAGNOSIS — I700 Atherosclerosis of aorta: Secondary | ICD-10-CM | POA: Diagnosis not present

## 2024-02-04 DIAGNOSIS — F17219 Nicotine dependence, cigarettes, with unspecified nicotine-induced disorders: Secondary | ICD-10-CM | POA: Diagnosis not present

## 2024-02-04 DIAGNOSIS — Z902 Acquired absence of lung [part of]: Secondary | ICD-10-CM | POA: Diagnosis not present

## 2024-02-04 DIAGNOSIS — I10 Essential (primary) hypertension: Secondary | ICD-10-CM | POA: Diagnosis not present

## 2024-02-04 DIAGNOSIS — E7800 Pure hypercholesterolemia, unspecified: Secondary | ICD-10-CM | POA: Diagnosis not present

## 2024-02-04 DIAGNOSIS — G4733 Obstructive sleep apnea (adult) (pediatric): Secondary | ICD-10-CM | POA: Diagnosis not present

## 2024-02-04 DIAGNOSIS — J301 Allergic rhinitis due to pollen: Secondary | ICD-10-CM | POA: Diagnosis not present

## 2024-02-04 DIAGNOSIS — Z0000 Encounter for general adult medical examination without abnormal findings: Secondary | ICD-10-CM | POA: Diagnosis not present

## 2024-02-04 DIAGNOSIS — I2510 Atherosclerotic heart disease of native coronary artery without angina pectoris: Secondary | ICD-10-CM | POA: Diagnosis not present

## 2024-02-04 DIAGNOSIS — E559 Vitamin D deficiency, unspecified: Secondary | ICD-10-CM | POA: Diagnosis not present

## 2024-05-29 ENCOUNTER — Ambulatory Visit: Admitting: Cardiology

## 2024-05-30 ENCOUNTER — Other Ambulatory Visit (HOSPITAL_COMMUNITY): Payer: Self-pay

## 2024-05-30 ENCOUNTER — Ambulatory Visit: Attending: Cardiology | Admitting: Cardiology

## 2024-05-30 ENCOUNTER — Encounter: Payer: Self-pay | Admitting: Cardiology

## 2024-05-30 VITALS — BP 127/75 | HR 75 | Ht 72.0 in | Wt 234.8 lb

## 2024-05-30 DIAGNOSIS — I350 Nonrheumatic aortic (valve) stenosis: Secondary | ICD-10-CM | POA: Diagnosis not present

## 2024-05-30 DIAGNOSIS — E782 Mixed hyperlipidemia: Secondary | ICD-10-CM

## 2024-05-30 DIAGNOSIS — F17210 Nicotine dependence, cigarettes, uncomplicated: Secondary | ICD-10-CM

## 2024-05-30 DIAGNOSIS — R0609 Other forms of dyspnea: Secondary | ICD-10-CM | POA: Diagnosis not present

## 2024-05-30 DIAGNOSIS — I2510 Atherosclerotic heart disease of native coronary artery without angina pectoris: Secondary | ICD-10-CM

## 2024-05-30 MED ORDER — NICOTINE 14 MG/24HR TD PT24
14.0000 mg | MEDICATED_PATCH | Freq: Every day | TRANSDERMAL | 1 refills | Status: AC
  Filled 2024-05-30: qty 14, 14d supply, fill #0
  Filled 2024-05-30: qty 28, 28d supply, fill #0

## 2024-05-30 MED ORDER — ATORVASTATIN CALCIUM 80 MG PO TABS
80.0000 mg | ORAL_TABLET | Freq: Every day | ORAL | 1 refills | Status: AC
  Filled 2024-05-30: qty 90, 90d supply, fill #0

## 2024-05-30 MED ORDER — NICOTINE 14 MG/24HR TD PT24
14.0000 mg | MEDICATED_PATCH | Freq: Every day | TRANSDERMAL | 0 refills | Status: DC
  Filled 2024-05-30: qty 28, 28d supply, fill #0

## 2024-05-30 NOTE — Progress Notes (Signed)
 Cardiology Office Note:  .   Date:  05/30/2024  ID:  Mitchell Baxter, DOB [birth year], MRN [id number] PCP: Royden Ronal Czar, FNP  [facility] Providers Cardiologist:  Newman Lawrence, MD PCP: Royden Ronal Czar, FNP  Chief Complaint  Patient presents with   Coronary Artery Disease     Mitchell Baxter is a 73 y.o. male with hypertension, tobacco dependence, coronary and aortic atherosclerosis  History of Present Illness  I last saw the patient in 2021.  At that time, workup at showed mild nonobstructive coronary artery disease.  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  He has stable unchanged exertional dyspnea, denies any chest pain.  Unfortunately, he continues to smoke 1 pack/day.  Thus far, he has attributed his dyspnea to COPD.  Reviewed recent lab results with the patient, details below.     Vitals:   05/30/24 0833  BP: 127/75  Pulse: 75  SpO2: 94%      Review of Systems  Cardiovascular:  Negative for chest pain, dyspnea on exertion, leg swelling, palpitations and syncope.        Studies Reviewed: SABRA        EKG 05/30/2024: Normal sinus rhythm Normal ECG When compared with ECG of 27-Jan-2021 13:50, No significant change was found  Coronary angiography 2021: LM: No significant disease (Calcium  sen in vessel wall, which correlates with finding on CT chest) LAD: Prox LAD/D2 bifurcation 50% disease. dFR 0.93 in mid LAD beyond the lesion LCx: OM1 with ectasia with 20% lesions proximal and distal to the ectatic part.  RCA: Prox and PDA 20% lesions   Normal LVEDP.   Impression: Nonobstructive coronary artery disease. Symptoms of chest pain and shortness of breath unlikely to be due to CAD.   Recommendation: Continue aggressive risk factor modification and medical management for CAD, including Aspirin  and statin.     Labs 12/2023: Chol 143, TG 99, HDL 49, LDL 76 HbA1C 6.0% Hb  15.1 Cr 1.21      Physical Exam Vitals and nursing note reviewed.  Constitutional:      General: He is not in acute distress. Neck:     Vascular: No JVD.  Cardiovascular:     Rate and Rhythm: Normal rate and regular rhythm.     Heart sounds: Normal heart sounds. No murmur heard. Pulmonary:     Effort: Pulmonary effort is normal.     Breath sounds: Normal breath sounds. No wheezing or rales.  Musculoskeletal:     Right lower leg: No edema.     Left lower leg: No edema.      VISIT DIAGNOSES:   ICD-10-CM   1. Coronary artery disease involving native coronary artery of native heart without angina pectoris  I25.10 EKG 12-Lead    NM PET CT CARDIAC PERFUSION MULTI W/ABSOLUTE BLOODFLOW    2. Exertional dyspnea  R06.09 ECHOCARDIOGRAM COMPLETE    3. Mixed hyperlipidemia  E78.2     4. Mild aortic stenosis  I35.0     5. Nicotine  dependence, cigarettes, uncomplicated  F17.210        Mitchell Baxter is a 73 y.o. male with hypertension, tobacco dependence, coronary and aortic atherosclerosis Assessment & Plan  CAD: Mild nonobstructive CAD on cath in 2021. Persistent exertional dyspnea symptoms. While COPD and obesity most likely differential, reasonable to reassess for ischemia.   Recommend echocardiogram and PET/CT stress test. In absence of bleeding, I will continue aspirin   81 mg daily. Increase Lipitor to 80 mg daily, with goal LDL <70.  In future, this refill can be taken over by PCP.    Aortic stenosis: Mild in 2021.  Will recheck echocardiogram now.   Nicotine  dependence: Tobacco cessation counseling:  - Currently smoking 1 pack/day   - Patient was informed of the dangers of tobacco abuse including stroke, cancer, and MI, as well as benefits of tobacco cessation. - Patient is willing to quit at this time. - Approximately 5 mins were spent counseling patient cessation techniques. We discussed various methods to help quit smoking, including deciding on a date to  quit, joining a support group, pharmacological agents. Patient would like to use nicotine  patch.  Prescription sent.  This could be taken over by PCP in future, as needed. - I will reassess his progress at the next follow-up visit     Informed Consent   Shared Decision Making/Informed Consent The risks [chest pain, shortness of breath, cardiac arrhythmias, dizziness, blood pressure fluctuations, myocardial infarction, stroke/transient ischemic attack, nausea, vomiting, allergic reaction, radiation exposure, metallic taste sensation and life-threatening complications (estimated to be 1 in 10,000)], benefits (risk stratification, diagnosing coronary artery disease, treatment guidance) and alternatives of a cardiac PET stress test were discussed in detail with Mr. Vanlanen and he agrees to proceed.       Meds ordered this encounter  Medications   atorvastatin  (LIPITOR) 80 MG tablet    Sig: Take 1 tablet (80 mg total) by mouth at bedtime.    Dispense:  90 tablet    Refill:  1   nicotine  (NICODERM CQ  - DOSED IN MG/24 HOURS) 14 mg/24hr patch    Sig: Place 1 patch (14 mg total) onto the skin daily.    Dispense:  28 patch    Refill:  0     F/u in 6 months  Signed, Newman JINNY Lawrence, MD

## 2024-05-30 NOTE — Patient Instructions (Signed)
 Medication Instructions:  INCREASE Atorvastatin  to 80 mg daily  START Nicotine  patch 14 mg   *If you need a refill on your cardiac medications before your next appointment, please call your pharmacy*  Testing/Procedures: Echo  Your physician has requested that you have an echocardiogram. Echocardiography is a painless test that uses sound waves to create images of your heart. It provides your doctor with information about the size and shape of your heart and how well your heart's chambers and valves are working. This procedure takes approximately one hour. There are no restrictions for this procedure. Please do NOT wear cologne, perfume, aftershave, or lotions (deodorant is allowed). Please arrive 15 minutes prior to your appointment time.  Please note: We ask at that you not bring children with you during ultrasound (echo/ vascular) testing. Due to room size and safety concerns, children are not allowed in the ultrasound rooms during exams. Our front office staff cannot provide observation of children in our lobby area while testing is being conducted. An adult accompanying a patient to their appointment will only be allowed in the ultrasound room at the discretion of the ultrasound technician under special circumstances. We apologize for any inconvenience.   PET/CT  How to Prepare for Your Cardiac PET/CT Stress Test:  1. Please do not take these medications before your test:  ~Medications that may interfere with the cardiac pharmacological stress agent (ex. nitrates - including erectile dysfunction medications, isosorbide mononitrate, tamulosin or beta-blockers) the day of the exam. (Erectile dysfunction medication should be held for at least 72 hrs prior to test) ~Theophylline containing medications for 12 hours. ~Dipyridamole 48 hours prior to the test. ~Your remaining medications may be taken with water.  2. Nothing to eat or drink, except water, 3 hours prior to arrival time.   ~ NO  caffeine/decaffeinated products, or chocolate 12 hours prior to arrival.  3. NO perfume, cologne or lotion on chest or abdomen area.         - FEMALES - Please avoid wearing dresses to this appointment.  4. Total time is 1 to 2 hours; you may want to bring reading material for the waiting time.  Please report to Radiology at the [facility] Main Entrance 30 minutes early for your test. [address] [place], [place] [address]  Diabetic Preparation: - Hold oral medications. - You may take NPH and Lantus insulin. - Do not take Humalog or Humulin R (Regular Insulin) the day of your test. - Check blood sugars prior to leaving the house. - If able to eat breakfast prior to 3 hour fasting, you may take all medications, including your insulin, - Do not worry if you miss your breakfast dose of insulin - start at your next meal. - Patients who wear a continuous glucose monitor MUST remove the device prior to scanning.  IF YOU THINK YOU MAY BE PREGNANT, OR ARE NURSING PLEASE INFORM THE TECHNOLOGIST.  In preparation for your appointment, medication and supplies will be purchased.  Appointment availability is limited, so if you need to cancel or reschedule, please call the Radiology Department at [phone] Geroge Law) OR [phone] [facility])  24 hours in advance to avoid a cancellation fee of $100.00  What to Expect After you Arrive:  Once you arrive and check in for your appointment, you will be taken to a preparation room within the Radiology Department.  A technologist or Nurse will obtain your medical history, verify that you are correctly prepped for the exam, and explain  the procedure.  Afterwards,  an IV will be started in your arm and electrodes will be placed on your skin for EKG monitoring during the stress portion of the exam. Then you will be escorted to the PET/CT scanner.  There, staff will get you positioned on the scanner and obtain a blood pressure and EKG.  During the  exam, you will continue to be connected to the EKG and blood pressure machines.  A small, safe amount of a radioactive tracer will be injected in your IV to obtain a series of pictures of your heart along with an injection of a stress agent.    After your Exam:  It is recommended that you eat a meal and drink a caffeinated beverage to counter act any effects of the stress agent.  Drink plenty of fluids for the remainder of the day and urinate frequently for the first couple of hours after the exam.  Your doctor will inform you of your test results within 7-10 business days.  For more information and frequently asked questions, please visit our website : [email or web address]  For questions about your test or how to prepare for your test, please call: Cardiac Imaging Nurse Navigators Office: [phone]    Follow-Up: At [facility], you and your health needs are our priority.  As part of our continuing mission to provide you with exceptional heart care, our providers are all part of one team.  This team includes your primary Cardiologist (physician) and Advanced Practice Providers or APPs (Physician Assistants and Nurse Practitioners) who all work together to provide you with the care you need, when you need it.  Your next appointment:   6 month(s)  Provider:   Newman JINNY Lawrence, MD    We recommend signing up for the patient portal called MyChart.  Sign up information is provided on this After Visit Summary.  MyChart is used to connect with patients for Virtual Visits (Telemedicine).  Patients are able to view lab/test results, encounter notes, upcoming appointments, etc.  Non-urgent messages can be sent to your provider as well.   To learn more about what you can do with MyChart, go to [email or web address].

## 2024-06-10 ENCOUNTER — Other Ambulatory Visit (HOSPITAL_COMMUNITY): Payer: Self-pay

## 2024-07-09 ENCOUNTER — Ambulatory Visit (HOSPITAL_COMMUNITY)
Admission: RE | Admit: 2024-07-09 | Discharge: 2024-07-09 | Disposition: A | Discharge status: Home / Self Care | Source: Ambulatory Visit | Attending: Cardiovascular Disease | Admitting: Cardiovascular Disease

## 2024-07-09 DIAGNOSIS — R0609 Other forms of dyspnea: Secondary | ICD-10-CM | POA: Insufficient documentation

## 2024-07-09 LAB — ECHOCARDIOGRAM COMPLETE
AR max vel: 1.48 cm2
AV Area VTI: 1.49 cm2
AV Area mean vel: 1.51 cm2
AV Mean grad: 13 mmHg
AV Peak grad: 23.2 mmHg
Ao pk vel: 2.41 m/s
Area-P 1/2: 2.89 cm2
S' Lateral: 3 cm

## 2024-07-10 ENCOUNTER — Ambulatory Visit: Payer: Self-pay | Admitting: Cardiology

## 2024-07-10 DIAGNOSIS — I350 Nonrheumatic aortic (valve) stenosis: Secondary | ICD-10-CM

## 2024-07-10 NOTE — Progress Notes (Signed)
 Normal pumping function of the heart. No severe heart valve abnormalities noted.  Aortic stenosis remains mild. Repeat echocardiogram in 2 years.  Thanks MJP

## 2024-07-11 ENCOUNTER — Telehealth: Payer: Self-pay | Admitting: Cardiology

## 2024-07-11 NOTE — Telephone Encounter (Signed)
Pt returning nurses call from yesterday regarding Echo results. Please advise 

## 2024-07-11 NOTE — Telephone Encounter (Signed)
 Please review result note.

## 2024-08-06 DIAGNOSIS — Z23 Encounter for immunization: Secondary | ICD-10-CM | POA: Diagnosis not present

## 2024-08-06 DIAGNOSIS — E7800 Pure hypercholesterolemia, unspecified: Secondary | ICD-10-CM | POA: Diagnosis not present

## 2024-08-06 DIAGNOSIS — F17219 Nicotine dependence, cigarettes, with unspecified nicotine-induced disorders: Secondary | ICD-10-CM | POA: Diagnosis not present

## 2024-08-06 DIAGNOSIS — I700 Atherosclerosis of aorta: Secondary | ICD-10-CM | POA: Diagnosis not present

## 2024-08-06 DIAGNOSIS — R5383 Other fatigue: Secondary | ICD-10-CM | POA: Diagnosis not present

## 2024-08-06 DIAGNOSIS — R7303 Prediabetes: Secondary | ICD-10-CM | POA: Diagnosis not present

## 2024-08-06 DIAGNOSIS — E559 Vitamin D deficiency, unspecified: Secondary | ICD-10-CM | POA: Diagnosis not present

## 2024-08-06 DIAGNOSIS — I2510 Atherosclerotic heart disease of native coronary artery without angina pectoris: Secondary | ICD-10-CM | POA: Diagnosis not present

## 2024-08-06 DIAGNOSIS — I10 Essential (primary) hypertension: Secondary | ICD-10-CM | POA: Diagnosis not present

## 2024-10-13 ENCOUNTER — Other Ambulatory Visit: Payer: Self-pay | Admitting: Registered Nurse

## 2024-10-13 DIAGNOSIS — F17219 Nicotine dependence, cigarettes, with unspecified nicotine-induced disorders: Secondary | ICD-10-CM

## 2024-10-24 ENCOUNTER — Ambulatory Visit
Admission: RE | Admit: 2024-10-24 | Discharge: 2024-10-24 | Disposition: A | Discharge status: Home / Self Care | Source: Ambulatory Visit | Attending: Registered Nurse | Admitting: Registered Nurse

## 2024-10-24 DIAGNOSIS — F17219 Nicotine dependence, cigarettes, with unspecified nicotine-induced disorders: Secondary | ICD-10-CM

## 2024-11-18 ENCOUNTER — Other Ambulatory Visit: Payer: Self-pay

## 2024-11-18 DIAGNOSIS — C3491 Malignant neoplasm of unspecified part of right bronchus or lung: Secondary | ICD-10-CM

## 2024-11-19 ENCOUNTER — Inpatient Hospital Stay: Attending: Internal Medicine | Admitting: Internal Medicine

## 2024-11-19 ENCOUNTER — Inpatient Hospital Stay

## 2024-11-19 VITALS — BP 112/77 | HR 66 | Temp 97.6°F | Resp 17 | Ht 72.0 in | Wt 234.0 lb

## 2024-11-19 DIAGNOSIS — I10 Essential (primary) hypertension: Secondary | ICD-10-CM

## 2024-11-19 DIAGNOSIS — F17210 Nicotine dependence, cigarettes, uncomplicated: Secondary | ICD-10-CM

## 2024-11-19 DIAGNOSIS — C3490 Malignant neoplasm of unspecified part of unspecified bronchus or lung: Secondary | ICD-10-CM

## 2024-11-19 DIAGNOSIS — G4730 Sleep apnea, unspecified: Secondary | ICD-10-CM | POA: Diagnosis not present

## 2024-11-19 DIAGNOSIS — K449 Diaphragmatic hernia without obstruction or gangrene: Secondary | ICD-10-CM | POA: Diagnosis not present

## 2024-11-19 DIAGNOSIS — C3491 Malignant neoplasm of unspecified part of right bronchus or lung: Secondary | ICD-10-CM

## 2024-11-19 DIAGNOSIS — I2510 Atherosclerotic heart disease of native coronary artery without angina pectoris: Secondary | ICD-10-CM

## 2024-11-19 DIAGNOSIS — Z9011 Acquired absence of right breast and nipple: Secondary | ICD-10-CM

## 2024-11-19 DIAGNOSIS — Z79899 Other long term (current) drug therapy: Secondary | ICD-10-CM

## 2024-11-19 DIAGNOSIS — Z7982 Long term (current) use of aspirin: Secondary | ICD-10-CM

## 2024-11-19 DIAGNOSIS — K219 Gastro-esophageal reflux disease without esophagitis: Secondary | ICD-10-CM | POA: Diagnosis not present

## 2024-11-19 DIAGNOSIS — J432 Centrilobular emphysema: Secondary | ICD-10-CM | POA: Diagnosis not present

## 2024-11-19 DIAGNOSIS — J438 Other emphysema: Secondary | ICD-10-CM

## 2024-11-19 DIAGNOSIS — G43909 Migraine, unspecified, not intractable, without status migrainosus: Secondary | ICD-10-CM | POA: Diagnosis not present

## 2024-11-19 DIAGNOSIS — C3431 Malignant neoplasm of lower lobe, right bronchus or lung: Secondary | ICD-10-CM

## 2024-11-19 DIAGNOSIS — F32A Depression, unspecified: Secondary | ICD-10-CM

## 2024-11-19 DIAGNOSIS — E785 Hyperlipidemia, unspecified: Secondary | ICD-10-CM | POA: Diagnosis not present

## 2024-11-19 DIAGNOSIS — I700 Atherosclerosis of aorta: Secondary | ICD-10-CM | POA: Diagnosis not present

## 2024-11-19 DIAGNOSIS — M47814 Spondylosis without myelopathy or radiculopathy, thoracic region: Secondary | ICD-10-CM

## 2024-11-19 DIAGNOSIS — Z800 Family history of malignant neoplasm of digestive organs: Secondary | ICD-10-CM

## 2024-11-19 LAB — CBC WITH DIFFERENTIAL (CANCER CENTER ONLY)
Abs Immature Granulocytes: 0.02 K/uL (ref 0.00–0.07)
Basophils Absolute: 0.1 K/uL (ref 0.0–0.1)
Basophils Relative: 1 %
Eosinophils Absolute: 0.2 K/uL (ref 0.0–0.5)
Eosinophils Relative: 3 %
HCT: 46.3 % (ref 39.0–52.0)
Hemoglobin: 15.9 g/dL (ref 13.0–17.0)
Immature Granulocytes: 0 %
Lymphocytes Relative: 36 %
Lymphs Abs: 2.6 K/uL (ref 0.7–4.0)
MCH: 32.2 pg (ref 26.0–34.0)
MCHC: 34.3 g/dL (ref 30.0–36.0)
MCV: 93.7 fL (ref 80.0–100.0)
Monocytes Absolute: 0.8 K/uL (ref 0.1–1.0)
Monocytes Relative: 11 %
Neutro Abs: 3.6 K/uL (ref 1.7–7.7)
Neutrophils Relative %: 49 %
Platelet Count: 217 K/uL (ref 150–400)
RBC: 4.94 MIL/uL (ref 4.22–5.81)
RDW: 13.4 % (ref 11.5–15.5)
WBC Count: 7.4 K/uL (ref 4.0–10.5)
nRBC: 0 % (ref 0.0–0.2)

## 2024-11-19 LAB — CMP (CANCER CENTER ONLY)
ALT: 17 U/L (ref 0–44)
AST: 21 U/L (ref 15–41)
Albumin: 3.9 g/dL (ref 3.5–5.0)
Alkaline Phosphatase: 55 U/L (ref 38–126)
Anion gap: 9 (ref 5–15)
BUN: 13 mg/dL (ref 8–23)
CO2: 28 mmol/L (ref 22–32)
Calcium: 9.1 mg/dL (ref 8.9–10.3)
Chloride: 103 mmol/L (ref 98–111)
Creatinine: 1.17 mg/dL (ref 0.61–1.24)
GFR, Estimated: >60 mL/min
Glucose, Bld: 114 mg/dL — ABNORMAL HIGH (ref 70–99)
Potassium: 4.3 mmol/L (ref 3.5–5.1)
Sodium: 140 mmol/L (ref 135–145)
Total Bilirubin: 0.4 mg/dL (ref 0.0–1.2)
Total Protein: 6.6 g/dL (ref 6.5–8.1)

## 2024-11-19 NOTE — Progress Notes (Signed)
 "   [facility] [phone]   [phone]  CONSULT NOTE  REFERRING PHYSICIAN: Ronal Leeroy Marie, FNP  REASON FOR CONSULTATION:  74 years old male with suspicious recurrent lung cancer  HPI Mitchell Baxter is a 74 y.o. male.   HPI  Discussed the use of AI scribe software for clinical note transcription with the patient, who gave verbal consent to proceed.  History of Present Illness Mitchell Baxter is a 74 year old male with stage IA non-small cell lung adenocarcinoma, status post right lower lobe superior segmentectomy, who presents for evaluation of new bilateral pulmonary nodules identified on surveillance imaging.  He was diagnosed with stage IA non-small cell lung adenocarcinoma in April 2022 and underwent right lower lobe superior segmentectomy. He was followed every six months until November 2022, after which he was lost to oncology follow-up for over three years. Recent surveillance chest CT scans revealed several new pulmonary nodules: a 1.3 cm ground glass nodule in the left upper lobe, a 0.4 cm nodule in the left lower lobe, and a 1.3 cm nodule in the right middle lobe.  He denies recent respiratory infections, hemoptysis, sputum production, or fever. He has a persistent, deep cough but no chest pain, except for intermittent discomfort that has been present for several years. He denies recent weight loss or night sweats, but does experience nocturnal shivering despite using a heat blanket and oil heater in a cold bedroom.  He has chronic headaches and migraine, but no nausea, vomiting, or diarrhea. He has a hiatal hernia, which limits his ability to vomit. His medical history is notable for COPD, depression, gastroesophageal reflux disease, hypertension, hyperlipidemia, sleep apnea, and vertigo. He continues to smoke one pack per day since age 8 and denies alcohol or illicit drug use. Family history significant for father with colon and pancreatic  cancer. The patient is married and has 3 children.  He has a history of smoking for over 55 years and continues to smoke up to 2 packs/day.  No history of alcohol or drug abuse.    Past Medical History:  Diagnosis Date   Chronic migraine w/o aura, not intractable, w stat migr    COPD (chronic obstructive pulmonary disease) (HCC)    Depression    GERD (gastroesophageal reflux disease)    Headache(784.0)    migraine   Heart murmur    very mild AS 01/26/21 echo   Hyperlipidemia    Hypertension    Shortness of breath    exertion   Sleep apnea    stopbang=4   Vertigo       Past Surgical History:  Procedure Laterality Date   BRAVO [facility] STUDY  11/29/2011   Procedure: BRAVO PH STUDY;  Surgeon: Elsie CHRISTELLA Cree, MD;  Location: [facility] ENDOSCOPY;  Service: Endoscopy;  Laterality: N/A;   COLONOSCOPY  10/03/2012   Procedure: COLONOSCOPY;  Surgeon: Alm VEAR Angle, MD;  Location: [facility];  Service: General;  Laterality: N/A;  carol/leone   CORONARY PRESSURE/FFR STUDY N/A 11/14/2019   Procedure: INTRAVASCULAR PRESSURE WIRE/FFR STUDY;  Surgeon: Elmira Newman PARAS, MD;  Location: [facility] INVASIVE CV LAB;  Service: Cardiovascular;  Laterality: N/A;   HIATAL HERNIA REPAIR  04/16/2012   Procedure: LAPAROSCOPIC REPAIR OF HIATAL HERNIA;  Surgeon: Donnice KATHEE Lunger, MD;  Location: [facility] ORS;  Service: General;;   INTERCOSTAL NERVE BLOCK Right 01/31/2021   Procedure: INTERCOSTAL NERVE BLOCK;  Surgeon: Kerrin Elspeth BROCKS, MD;  Location: [facility] OR;  Service: Thoracic;  Laterality: Right;  LAPAROSCOPIC NISSEN FUNDOPLICATION  04/16/2012   Procedure: LAPAROSCOPIC NISSEN FUNDOPLICATION;  Surgeon: Donnice KATHEE Lunger, MD;  Location: [facility] ORS;  Service: General;  Laterality: N/A;   LEFT HEART CATH AND CORONARY ANGIOGRAPHY N/A 11/14/2019   Procedure: LEFT HEART CATH AND CORONARY ANGIOGRAPHY;  Surgeon: Elmira Newman PARAS, MD;  Location: [facility] INVASIVE CV LAB;  Service: Cardiovascular;  Laterality: N/A;   NODE DISSECTION Right  01/31/2021   Procedure: NODE DISSECTION;  Surgeon: Kerrin Elspeth BROCKS, MD;  Location: [facility] OR;  Service: Thoracic;  Laterality: Right;   none     XI ROBOTIC ASSISTED THORACOSCOPY- SEGMENTECTOMY Right 01/31/2021   Procedure: XI ROBOTIC ASSISTED THORACOSCOPY-RIGHT LOWER LOBE SUPERIOR SEGMENTECTOMY;  Surgeon: Kerrin Elspeth BROCKS, MD;  Location: [facility] OR;  Service: Thoracic;  Laterality: Right;    Family History  Problem Relation Age of Onset   Colon cancer Father        pancreatic   Anesthesia problems Neg Hx    Hypotension Neg Hx    Malignant hyperthermia Neg Hx    Pseudochol deficiency Neg Hx    Migraines Neg Hx     Social History Social History[1]  Allergies[2]  Current Outpatient Medications  Medication Sig Dispense Refill   acetaminophen  (TYLENOL ) 500 MG tablet Take 2 tablets (1,000 mg total) by mouth every 6 (six) hours. 30 tablet 0   aspirin  EC 81 MG tablet Take 1 tablet (81 mg total) by mouth daily. 30 tablet 3   atorvastatin  (LIPITOR) 80 MG tablet Take 1 tablet (80 mg total) by mouth at bedtime. 90 tablet 1   FLUoxetine  (PROZAC ) 40 MG capsule Take 40 mg by mouth every morning.     gabapentin  (NEURONTIN ) 300 MG capsule Take 300 mg by mouth in the morning and at bedtime.  6   Ipratropium-Albuterol  (COMBIVENT) 20-100 MCG/ACT AERS respimat Inhale 1 puff into the lungs every 6 (six) hours as needed for wheezing or shortness of breath. 2 g 0   ketoconazole (NIZORAL) 2 % shampoo Apply 1 application topically 2 (two) times a week.     LINZESS  290 MCG CAPS capsule Take 290 mcg by mouth daily as needed for constipation.     nicotine  (NICODERM CQ  - DOSED IN MG/24 HOURS) 14 mg/24hr patch Place 1 patch (14 mg total) onto the skin daily. 28 patch 1   valsartan -hydrochlorothiazide  (DIOVAN -HCT) 160-12.5 MG tablet Take 0.5 tablets by mouth at bedtime.      No current facility-administered medications for this visit.    Review of Systems  Constitutional: positive for fatigue Eyes:  negative Ears, nose, mouth, throat, and face: negative Respiratory: positive for cough and dyspnea on exertion Cardiovascular: negative Gastrointestinal: negative Genitourinary:negative Integument/breast: negative Hematologic/lymphatic: negative Musculoskeletal:negative Neurological: negative Behavioral/Psych: negative Endocrine: negative Allergic/Immunologic: negative  Physical Exam  Mitchell Baxter:jozmu, healthy, no distress, well nourished, and well developed SKIN: skin color, texture, turgor are normal, no rashes or significant lesions HEAD: Normocephalic, No masses, lesions, tenderness or abnormalities EYES: normal, PERRLA, Conjunctiva are pink and non-injected EARS: External ears normal, Canals clear OROPHARYNX:no exudate, no erythema, and lips, buccal mucosa, and tongue normal  NECK: supple, no adenopathy, no JVD LYMPH:  no palpable lymphadenopathy, no hepatosplenomegaly LUNGS: clear to auscultation , and palpation HEART: regular rate & rhythm, no murmurs, and no gallops ABDOMEN:abdomen soft, non-tender, normal bowel sounds, and no masses or organomegaly BACK: Back symmetric, no curvature., No CVA tenderness EXTREMITIES:no joint deformities, effusion, or inflammation, no edema  NEURO: alert & oriented x 3 with fluent speech, no focal motor/sensory deficits  PERFORMANCE STATUS: ECOG 1  LABORATORY DATA: Lab Results  Component Value Date   WBC 7.4 11/19/2024   HGB 15.9 11/19/2024   HCT 46.3 11/19/2024   MCV 93.7 11/19/2024   PLT 217 11/19/2024      Chemistry      Component Value Date/Time   NA 140 11/19/2024 1116   NA 144 06/18/2019 0950   K 4.3 11/19/2024 1116   CL 103 11/19/2024 1116   CO2 28 11/19/2024 1116   BUN 13 11/19/2024 1116   BUN 9 06/18/2019 0950   CREATININE 1.17 11/19/2024 1116      Component Value Date/Time   CALCIUM  9.1 11/19/2024 1116   ALKPHOS 55 11/19/2024 1116   AST 21 11/19/2024 1116   ALT 17 11/19/2024 1116   BILITOT 0.4 11/19/2024 1116        RADIOGRAPHIC STUDIES: CT CHEST LUNG CA SCREEN LOW DOSE W/O CM Result Date: 10/31/2024 CLINICAL DATA:  74 year old male current smoker with 54 pack-year smoking history EXAM: CT CHEST WITHOUT CONTRAST LOW-DOSE FOR LUNG CANCER SCREENING TECHNIQUE: Multidetector CT imaging of the chest was performed following the standard protocol without IV contrast. RADIATION DOSE REDUCTION: This exam was performed according to the departmental dose-optimization program which includes automated exposure control, adjustment of the mA and/or kV according to patient size and/or use of iterative reconstruction technique. COMPARISON:  02/22/2023 screening chest CT FINDINGS: Cardiovascular: Normal heart size. No significant pericardial effusion/thickening. Three-vessel coronary atherosclerosis. Atherosclerotic nonaneurysmal thoracic aorta. Normal caliber pulmonary arteries. Mediastinum/Nodes: No significant thyroid  nodules. Unremarkable esophagus. No pathologically enlarged axillary, mediastinal or hilar lymph nodes, noting limited sensitivity for the detection of hilar adenopathy on this noncontrast study. Lungs/Pleura: No pneumothorax. No pleural effusion. Moderate paraseptal and centrilobular emphysema with diffuse bronchial wall thickening. Stable postsurgical changes from right lower lobe wedge resection. No acute consolidative airspace disease or lung masses. Ground-glass left upper lobe 13.2 mm nodule on image 66 is mildly increased from 11.7 mm. Additional previously visualized scattered tiny pulmonary nodules up to 4.1 mm in the peripheral left lower lobe on image 228 are stable. New indistinct part solid 13.0 mm peripheral right middle lobe nodule with 4.5 mm solid component on image 242. Upper abdomen: No acute abnormality. Musculoskeletal: No aggressive appearing focal osseous lesions. Moderate thoracic spondylosis. IMPRESSION: 1. Lung-RADS 3, probably benign findings. Short-term follow-up in 6 months is recommended  with repeat low-dose chest CT without contrast (please use the following order, CT CHEST LCS NODULE FOLLOW-UP W/O CM). New indistinct part solid 13.0 mm peripheral right middle lobe nodule with 4.5 mm solid component. 2. Three-vessel coronary atherosclerosis. 3. Aortic Atherosclerosis (ICD10-I70.0) and Emphysema (ICD10-J43.9). These results will be called to the ordering clinician or representative by the Radiologist Assistant, and communication documented in the PACS or [employer]. Electronically Signed   By: Selinda DELENA Blue M.D.   On: 10/31/2024 16:05    ASSESSMENT: This is a very pleasant 74 years old white male with history for stage Ia non-small cell lung cancer, adenocarcinoma status post right lower lobe superior segmentectomy in April 2022 and he has been on observation since that time.  He was lost to follow-up for more than 3 years.  Recent CT chest for lung cancer screening showed new right middle lobe lung nodule in addition to other left lung nodules.    PLAN: I had a lengthy discussion with the patient and his wife today about his current condition and further investigation to evaluate the pulmonary nodules. Assessment and Plan Assessment & Plan  Lung nodules New lung nodules identified on CT scan: ground glass nodule in the left upper lobe (1.3 cm), nodule in the left lower lobe (0.4 cm), and new nodule in the right middle lobe (1.3 cm). No symptoms of infection or recent illness. Differential includes possible inflammation or malignancy. Previous stage 1A non-small cell lung cancer (adenocarcinoma) with right lower lobe segmentectomy in April 2022. No evidence of growth in previous nodules; these are new findings. - Order follow-up CT scan in 3 months to monitor nodules. - If nodules grow, order PET scan and consider biopsy. - Encourage smoking cessation.  Chronic obstructive pulmonary disease (COPD) COPD with chronic cough. No recent infections or exacerbations  reported.  Smoking Continues to smoke one pack per day since age 54. Heavy smoking history contributing to lung health issues. - Encourage smoking cessation.  Follow-up Monitor lung nodules and assess for potential malignancy or other changes. - Schedule follow-up appointment in 3 months. - Ensure CT scan is completed one week prior to follow-up appointment. He was advised to call immediately if he has any other concerning symptoms in the interval.  The patient voices understanding of current disease status and treatment options and is in agreement with the current care plan.  All questions were answered. The patient knows to call the clinic with any problems, questions or concerns. We can certainly see the patient much sooner if necessary.  Thank you so much for allowing me to participate in the care of Najeh B Bakula. I will continue to follow up the patient with you and assist in his care. The total time spent in the appointment was 60 minutes including review of chart and various tests results, discussions about plan of care and coordination of care plan .   Disclaimer: This note was dictated with voice recognition software. Similar sounding words can inadvertently be transcribed and Mitchell not be corrected upon review.   Sherrod MARLA Sherrod November 19, 2024, 12:07 PM       [1]  Social History Tobacco Use   Smoking status: Every Day    Current packs/day: 1.00    Average packs/day: 1 pack/day for 40.0 years (40.0 ttl pk-yrs)    Types: Cigarettes   Smokeless tobacco: Never  Substance Use Topics   Alcohol use: Not Currently    Comment: occasional   Drug use: No  [2] No Known Allergies  "

## 2025-02-09 ENCOUNTER — Inpatient Hospital Stay

## 2025-02-17 ENCOUNTER — Inpatient Hospital Stay: Admitting: Internal Medicine
